# Patient Record
Sex: Female | Born: 1985 | Race: White | Hispanic: Yes | Marital: Single | Smoking: Never smoker
Health system: Southern US, Community
[De-identification: ages and names within clinical notes are randomized; demographics above are authoritative.]

## PROBLEM LIST (undated history)

## (undated) ENCOUNTER — Emergency Department (HOSPITAL_COMMUNITY): Disposition: A | Discharge status: Home / Self Care | Payer: Self-pay

## (undated) DIAGNOSIS — K7460 Unspecified cirrhosis of liver: Secondary | ICD-10-CM

## (undated) DIAGNOSIS — E282 Polycystic ovarian syndrome: Secondary | ICD-10-CM

## (undated) HISTORY — DX: Unspecified cirrhosis of liver: K74.60

## (undated) NOTE — *Deleted (*Deleted)
HEMATOLOGY/ONCOLOGY CONSULTATION NOTE  Date of Service: 01/05/2020  Patient Care Team: Patient, No Pcp Per as PCP - General (General Practice)  CHIEF COMPLAINTS/PURPOSE OF CONSULTATION:  Chronic ITP  HISTORY OF PRESENTING ILLNESS:  Megan Fuller is a wonderful 61 y.o. female who has been referred to [place] by Dr. 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 Pt is accompanied today by ***. The pt reports that she is doing well overall.   The pt reports ***   Of note prior to the patient's visit today, pt has had *** completed on *** with results revealing ***.   Most recent lab results (01/01/2020) of CBC and CMP is as follows: all values are WNL except for RBC at 2.69, Hgb at 9.5, HCT at 28.3, MCV at 105.2, MCH at 35.3, PLT at 77K, Calcium at 8.6, Total Protein at 5.3, Albumin at 2.3, AST at 47.  On review of systems, pt reports *** and denies ***and any other symptoms.   On PMHx the pt reports ***. On Social Hx the pt reports *** On Family Hx the pt reports ***  A: -Discussed patient's most recent labs from ***, *** -***  MEDICAL HISTORY:  Past Medical History:  Diagnosis Date  . PCOS (polycystic ovarian syndrome)     SURGICAL HISTORY: Past Surgical History:  Procedure Laterality Date  . CESAREAN SECTION N/A 12/26/2019   Procedure: CESAREAN SECTION;  Surgeon: [place] Bing, MD;  Location: [facility];  Service: Obstetrics;  Laterality: N/A;    SOCIAL HISTORY: Social History   Socioeconomic History  . Marital status: Single    Spouse name: Not on file  . Number of children: Not on file  . Years of education: Not on file  . Highest education level: Not on file  Occupational History  . Not on file  Tobacco Use  . Smoking status: Never Smoker  . Smokeless tobacco: Never Used  Substance and Sexual Activity  . Alcohol use: No    Alcohol/week: 0.0 standard drinks  . Drug use: No  . Sexual activity: Yes  Other Topics Concern  . Not on file  Social  History Narrative  . Not on file   Social Determinants of Health   Financial Resource Strain:   . Difficulty of Paying Living Expenses: Not on file  Food Insecurity:   . Worried About Programme researcher, broadcasting/film/video in the Last Year: Not on file  . Ran Out of Food in the Last Year: Not on file  Transportation Needs:   . Lack of Transportation (Medical): Not on file  . Lack of Transportation (Non-Medical): Not on file  Physical Activity:   . Days of Exercise per Week: Not on file  . Minutes of Exercise per Session: Not on file  Stress:   . Feeling of Stress : Not on file  Social Connections:   . Frequency of Communication with Friends and Family: Not on file  . Frequency of Social Gatherings with Friends and Family: Not on file  . Attends Religious Services: Not on file  . Active Member of Clubs or Organizations: Not on file  . Attends Banker Meetings: Not on file  . Marital Status: Not on file  Intimate Partner Violence:   . Fear of Current or Ex-Partner: Not on file  . Emotionally Abused: Not on file  . Physically Abused: Not on file  . Sexually Abused: Not on file    FAMILY HISTORY: Family History  Problem Relation Age  of Onset  . Diabetes Mother     ALLERGIES:  has No Known Allergies.  MEDICATIONS:  Current Outpatient Medications  Medication Sig Dispense Refill  . amLODipine (NORVASC) 5 MG tablet Take 1 tablet (5 mg total) by mouth daily. 30 tablet 2  . ferrous sulfate 325 (65 FE) MG tablet Take 1 tablet (325 mg total) by mouth 3 (three) times daily with meals. 30 tablet 3  . ibuprofen (ADVIL) 600 MG tablet Take 1 tablet (600 mg total) by mouth every 6 (six) hours. 30 tablet 0  . oxyCODONE-acetaminophen (PERCOCET/ROXICET) 5-325 MG tablet Take 1-2 tablets by mouth every 6 (six) hours as needed for moderate pain or severe pain. 30 tablet 0  . Prenat MV-Min-Methylfolate-FA (PRENATE PO) Take by mouth.    . QUEtiapine (SEROQUEL) 25 MG tablet Take 1 tablet (25 mg total)  by mouth at bedtime. 30 tablet 1   No current facility-administered medications for this visit.    REVIEW OF SYSTEMS:    10 Point review of Systems was done is negative except as noted above.  PHYSICAL EXAMINATION: ECOG PERFORMANCE STATUS: {CHL ONC ECOG HQ:[id number]}  .There were no vitals filed for this visit. There were no vitals filed for this visit. .There is no height or weight on file to calculate BMI.  *** GENERAL:alert, in no acute distress and comfortable SKIN: no acute rashes, no significant lesions EYES: conjunctiva are pink and non-injected, sclera anicteric OROPHARYNX: MMM, no exudates, no oropharyngeal erythema or ulceration NECK: supple, no JVD LYMPH:  no palpable lymphadenopathy in the cervical, axillary or inguinal regions LUNGS: clear to auscultation b/l with normal respiratory effort HEART: regular rate & rhythm ABDOMEN:  normoactive bowel sounds , non tender, not distended. Extremity: no pedal edema PSYCH: alert & oriented x 3 with fluent speech NEURO: no focal motor/sensory deficits  LABORATORY DATA:  I have reviewed the data as listed  . CBC Latest Ref Rng & Units 01/01/2020 12/28/2019 12/27/2019  WBC 4.0 - 10.5 K/uL 4.5 7.5 10.0  Hemoglobin 12.0 - 15.0 g/dL 1.3(K) 4.4(W) 10.0(L)  Hematocrit 36 - 46 % 28.3(L) 26.2(L) 28.7(L)  Platelets 150 - 400 K/uL 77(L) 60(L) 58(L)    . CMP Latest Ref Rng & Units 01/01/2020 12/28/2019 12/15/2019  Glucose 70 - 99 mg/dL 94 10(U) 725(D)  BUN 6 - 20 mg/dL 10 6 5(L)  Creatinine 6.64 - 1.00 mg/dL 4.03 4.74 2.59  Sodium 135 - 145 mmol/L 139 139 132(L)  Potassium 3.5 - 5.1 mmol/L 3.7 3.8 3.9  Chloride 98 - 111 mmol/L 107 108 104  CO2 22 - 32 mmol/L 25 23 19(L)  Calcium 8.9 - 10.3 mg/dL 5.6(L) 8.7(F) 7.2(L)  Total Protein 6.5 - 8.1 g/dL 5.3(L) 5.1(L) 6.2(L)  Total Bilirubin 0.3 - 1.2 mg/dL 1.0 6.4(P) 2.4(H)  Alkaline Phos 38 - 126 U/L 114 94 94  AST 15 - 41 U/L 47(H) 55(H) 39  ALT 0 - 44 U/L 30 24 18       RADIOGRAPHIC STUDIES: I have personally reviewed the radiological images as listed and agreed with the findings in the report. [place] MFM OB DETAIL +14 WK  Result Date: 12/16/2019 ----------------------------------------------------------------------  OBSTETRICS REPORT                       (Signed Final 12/16/2019 04:31 pm) ---------------------------------------------------------------------- Patient Info  ID #:       [id number]  D.O.B.:  1985/08/16 (34 yrs)  Name:       Megan Fuller              Visit Date: 12/16/2019 09:37 am ---------------------------------------------------------------------- Performed By  Attending:        Lin Landsman      Referred By:      [facility]                    MD                                       Care  Performed By:     Lenise Arena        [facility] ---------------------------------------------------------------------- Orders  #  Description                           Code        Ordered By  1  [place] MFM OB DETAIL +14 WK               76811.01    Scheryl Darter ----------------------------------------------------------------------  #  Order #                     Accession #                Episode #  1  [id number]                   [id number]                 [id number] ---------------------------------------------------------------------- Indications  Premature rupture of membranes - leaking       O42.90  fluid  Encounter for uncertain dates                  Z36.87  Medical complication of pregnancy (Adult       O26.90  chronic immune thrombocytopenic purpura)  Obesity complicating pregnancy, second         O99.212  trimester (BMI 31)  [redacted] weeks gestation of pregnancy                Z3A.23 ---------------------------------------------------------------------- Vital Signs  Weight (lb): 181                               Height:        5'4"  BMI:          31.07 ---------------------------------------------------------------------- Fetal Evaluation  Num Of Fetuses:         1  Fetal Heart Rate(bpm):  150  Cardiac Activity:       Observed  Presentation:           Cephalic  Placenta:               Anterior  P. Cord Insertion:      Visualized, central  Amniotic Fluid  AFI FV:  Oligohydramnios                              Largest Pocket(cm)                              3.3 ---------------------------------------------------------------------- Biometry  BPD:      55.7  mm     G. Age:  23w 0d         19  %    CI:        70.84   %    70 - 86                                                          FL/HC:      18.8   %    18.7 - 20.9  HC:      210.9  mm     G. Age:  23w 1d         15  %    HC/AC:      1.07        1.05 - 1.21  AC:      197.7  mm     G. Age:  24w 3d         65  %    FL/BPD:     71.1   %    71 - 87  FL:       39.6  mm     G. Age:  22w 5d         13  %    FL/AC:      20.0   %    20 - 24  CER:      25.3  mm     G. Age:  23w 0d         48  %  CM:        5.9  mm  Est. FW:     612  gm      1 lb 6 oz     37  % ---------------------------------------------------------------------- OB History  Gravidity:    1 ---------------------------------------------------------------------- Gestational Age  U/S Today:     23w 2d                                        EDD:   04/11/20  Best:          23w 5d     Det. ByMarcella Dubs         EDD:   04/08/20                                      (08/10/19) ---------------------------------------------------------------------- Anatomy  Cranium:               Not well visualized    Aortic Arch:            Not well visualized  Cavum:                 Not well  visualized    Ductal Arch:            Not well visualized  Ventricles:            Not well visualized    Diaphragm:              Not well visualized  Choroid Plexus:        Not well visualized    Stomach:                Absence of fluid                                                                         filled stomach  Cerebellum:            Appears normal         Abdomen:                Appears normal  Posterior Fossa:       Appears normal         Abdominal Wall:         Appears nml (cord                                                                        insert, abd wall)  Nuchal Fold:           Not applicable (>20    Cord Vessels:           Appears normal ([redacted]                         wks GA)                                        vessel cord)  Face:                  Not well visualized    Kidneys:                Appear normal  Lips:                  Not well visualized    Bladder:                Appears normal  Thoracic:              Appears normal         Spine:                  Ltd views no  intracranial signs of                                                                        NTD  Heart:                 Not well visualized    Upper Extremities:      Appears normal  RVOT:                  Appears normal         Lower Extremities:      Appears normal  LVOT:                  Not well visualized  Other:  Technically difficult due to low amniotic fluid and fetal position. ---------------------------------------------------------------------- Cervix Uterus Adnexa  Cervix  Not visualized (advanced GA >24wks) ---------------------------------------------------------------------- Impression  Single intrauterine pregnancy here for a detailed anatomy  with an inpatient admission due to premature rupture of  membranes.  Normal anatomy with measurements consistent with dates  There is good fetal movement and with oligohydramnios.  Suboptimal views of the fetal anatomy were obtained  secondary to fetal position and decreased amniotic fluid  volume. ---------------------------------------------------------------------- Recommendations  Follow up growth in 4 week  Management and testing per in patient provider.  ----------------------------------------------------------------------               Lin Landsman, MD Electronically Signed Final Report   12/16/2019 04:31 pm ----------------------------------------------------------------------  [place] MFM OB LIMITED  Result Date: 12/23/2019 ----------------------------------------------------------------------  OBSTETRICS REPORT                       (Signed Final 12/23/2019 02:15 pm) ---------------------------------------------------------------------- Patient Info  ID #:       [id number]                          D.O.B.:  [birth year] (34 yrs)  Name:       Megan Fuller              Visit Date: 12/23/2019 01:57 pm ---------------------------------------------------------------------- Performed By  Attending:        Noralee Space MD        Referred By:      [facility]                                                             Care  Performed By:     Sandi Mealy        [facility] ---------------------------------------------------------------------- Orders  #  Description  Code        Ordered By  1  [place] MFM OB LIMITED                     [id number]    Scheryl Darter ----------------------------------------------------------------------  #  Order #                     Accession #                Episode #  1  [id number]                   [id number]                 [id number] ---------------------------------------------------------------------- Indications  Premature rupture of membranes - leaking       O42.90  fluid  Medical complication of pregnancy (Adult       O26.90  chronic immune thrombocytopenic purpura)  Obesity complicating pregnancy, second         O99.212  trimester (BMI 31)  [redacted] weeks gestation of pregnancy                Z3A.24 ---------------------------------------------------------------------- Vital Signs                                                  Height:        5'4" ---------------------------------------------------------------------- Fetal Evaluation  Num Of Fetuses:         1  Fetal Heart Rate(bpm):  146  Cardiac Activity:       Observed  Presentation:           Cephalic  Placenta:               Anterior  P. Cord Insertion:      Visualized  Amniotic Fluid  AFI FV:      Anhydramnios                              Largest Pocket(cm)                              0 ---------------------------------------------------------------------- OB History  Gravidity:    1 ---------------------------------------------------------------------- Gestational Age  Best:          24w 5d     Det. ByMarcella Dubs         EDD:   04/08/20                                      (08/10/19) ---------------------------------------------------------------------- Impression  Patient was admitted with a diagnosis of PPROM.  She complains of decreased fetal movements.  A limited ultrasound study was performed .  No measurable  pocket of amniotic fluid was seen (anhydramnios).  Cephalic  presentation.  Good fetal activity was observed on ultrasound.  I reviewed the NST which is reassuring for this gestational  age. ---------------------------------------------------------------------- Recommendations  -Weekly limited ultrasound evaluation.  -BPP to begin at 28 to [redacted] weeks gestation. ----------------------------------------------------------------------                  Noralee Space, MD Electronically Signed Final Report  12/23/2019 02:15 pm ----------------------------------------------------------------------   ASSESSMENT & PLAN:   PLAN: ***  FOLLOW UP: ***   All of the patients questions were answered with apparent satisfaction. The patient knows to call the clinic with any problems, questions or concerns.  I spent *** counseling the patient face to face. The total time spent in the appointment was *** and more than 50% was on counseling and direct  patient cares.    Wyvonnia Lora MD MS AAHIVMS [facility] Hematology/Oncology Physician [facility]  (Office):       [phone] (Work cell):  [phone] (Fax):           [phone]  01/05/2020 7:40 AM  I, Carollee Herter, am acting as a scribe for Dr. Wyvonnia Lora.   {Add Production assistant, radio Statement}

---
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
## 2007-03-16 ENCOUNTER — Ambulatory Visit: Payer: Self-pay | Admitting: Internal Medicine

## 2007-03-16 LAB — CONVERTED CEMR LAB
ALT: 32 units/L (ref 0–35)
Basophils Absolute: 0 10*3/uL (ref 0.0–0.1)
Eosinophils Relative: 2 % (ref 0–5)
Glucose, Bld: 88 mg/dL (ref 70–99)
HCT: 40.6 % (ref 36.0–46.0)
MCHC: 34.2 g/dL (ref 30.0–36.0)
Monocytes Relative: 7 % (ref 3–12)
Neutrophils Relative %: 63 % (ref 43–77)
Potassium: 4.1 meq/L (ref 3.5–5.3)
RDW: 12.7 % (ref 11.5–15.5)
Sodium: 140 meq/L (ref 135–145)
TSH: 0.895 microintl units/mL (ref 0.350–5.50)
Total Bilirubin: 1.4 mg/dL — ABNORMAL HIGH (ref 0.3–1.2)

## 2007-04-13 ENCOUNTER — Ambulatory Visit: Payer: Self-pay | Admitting: Internal Medicine

## 2007-04-13 ENCOUNTER — Encounter: Payer: Self-pay | Admitting: Family Medicine

## 2007-04-13 LAB — CONVERTED CEMR LAB
Basophils Relative: 1 % (ref 0–1)
Eosinophils Absolute: 0.1 10*3/uL (ref 0.0–0.7)
Hemoglobin: 12.7 g/dL (ref 12.0–15.0)
Lymphocytes Relative: 32 % (ref 12–46)
MCHC: 33.9 g/dL (ref 30.0–36.0)
MCV: 94 fL (ref 78.0–100.0)
Neutro Abs: 2.6 10*3/uL (ref 1.7–7.7)
Platelets: 73 10*3/uL — ABNORMAL LOW (ref 150–400)
Sed Rate: 10 mm/hr (ref 0–22)
WBC: 4.4 10*3/uL (ref 4.0–10.5)

## 2007-05-11 ENCOUNTER — Ambulatory Visit: Payer: Self-pay | Admitting: Internal Medicine

## 2007-05-11 LAB — CONVERTED CEMR LAB
Basophils Absolute: 0 10*3/uL (ref 0.0–0.1)
Eosinophils Absolute: 0.1 10*3/uL (ref 0.0–0.7)
Lymphs Abs: 1.3 10*3/uL (ref 0.7–4.0)
Monocytes Absolute: 0.3 10*3/uL (ref 0.1–1.0)
Monocytes Relative: 6 % (ref 3–12)
Neutrophils Relative %: 63 % (ref 43–77)
RBC: 4.24 M/uL (ref 3.87–5.11)
RDW: 13.1 % (ref 11.5–15.5)

## 2007-07-08 ENCOUNTER — Ambulatory Visit: Payer: Self-pay | Admitting: Family Medicine

## 2007-07-08 ENCOUNTER — Encounter (INDEPENDENT_AMBULATORY_CARE_PROVIDER_SITE_OTHER): Payer: Self-pay | Admitting: Internal Medicine

## 2007-07-08 LAB — CONVERTED CEMR LAB
ALT: 29 units/L (ref 0–35)
Basophils Absolute: 0 10*3/uL (ref 0.0–0.1)
Basophils Relative: 0 % (ref 0–1)
Chloride: 107 meq/L (ref 96–112)
Eosinophils Absolute: 0.1 10*3/uL (ref 0.0–0.7)
HCT: 39.9 % (ref 36.0–46.0)
Helicobacter Pylori Antibody-IgG: 0.4
Lymphocytes Relative: 29 % (ref 12–46)
Lymphs Abs: 1 10*3/uL (ref 0.7–4.0)
MCHC: 33.6 g/dL (ref 30.0–36.0)
MCV: 94.8 fL (ref 78.0–100.0)
Monocytes Absolute: 0.2 10*3/uL (ref 0.1–1.0)
Neutro Abs: 2.1 10*3/uL (ref 1.7–7.7)
Neutrophils Relative %: 62 % (ref 43–77)
Potassium: 3.8 meq/L (ref 3.5–5.3)
RBC: 4.21 M/uL (ref 3.87–5.11)
Total Protein: 8 g/dL (ref 6.0–8.3)

## 2007-08-17 ENCOUNTER — Ambulatory Visit: Payer: Self-pay | Admitting: Family Medicine

## 2007-08-17 LAB — CONVERTED CEMR LAB
Basophils Absolute: 0 10*3/uL (ref 0.0–0.1)
Basophils Relative: 1 % (ref 0–1)
HCT: 41.3 % (ref 36.0–46.0)
Lymphocytes Relative: 34 % (ref 12–46)
MCV: 96.9 fL (ref 78.0–100.0)
Monocytes Absolute: 0.3 10*3/uL (ref 0.1–1.0)
Neutro Abs: 1.9 10*3/uL (ref 1.7–7.7)
TSH: 2.857 microintl units/mL (ref 0.350–5.50)
Vitamin B-12: 679 pg/mL (ref 211–911)
WBC: 3.5 10*3/uL — ABNORMAL LOW (ref 4.0–10.5)

## 2007-11-30 ENCOUNTER — Ambulatory Visit: Payer: Self-pay | Admitting: Family Medicine

## 2008-03-14 ENCOUNTER — Ambulatory Visit: Payer: Self-pay | Admitting: Family Medicine

## 2008-05-09 ENCOUNTER — Ambulatory Visit: Payer: Self-pay | Admitting: Family Medicine

## 2008-05-09 LAB — CONVERTED CEMR LAB
Basophils Absolute: 0 10*3/uL (ref 0.0–0.1)
Basophils Relative: 1 % (ref 0–1)
Eosinophils Absolute: 0.1 10*3/uL (ref 0.0–0.7)
HCT: 38.9 % (ref 36.0–46.0)
Hemoglobin: 13.2 g/dL (ref 12.0–15.0)
Lymphs Abs: 1 10*3/uL (ref 0.7–4.0)
MCHC: 33.9 g/dL (ref 30.0–36.0)
Monocytes Absolute: 0.3 10*3/uL (ref 0.1–1.0)
Neutro Abs: 2.4 10*3/uL (ref 1.7–7.7)

## 2008-05-16 ENCOUNTER — Ambulatory Visit: Payer: Self-pay | Admitting: Family Medicine

## 2008-05-16 ENCOUNTER — Encounter: Payer: Self-pay | Admitting: Family Medicine

## 2008-05-16 LAB — CONVERTED CEMR LAB
AST: 66 units/L — ABNORMAL HIGH (ref 0–37)
Albumin: 3.5 g/dL (ref 3.5–5.2)
BUN: 13 mg/dL (ref 6–23)
CO2: 21 meq/L (ref 19–32)
Chlamydia, DNA Probe: NEGATIVE
Creatinine, Ser: 0.63 mg/dL (ref 0.40–1.20)
Glucose, Bld: 87 mg/dL (ref 70–99)
Prolactin: 17 ng/mL
Testosterone: 159.35 ng/dL — ABNORMAL HIGH (ref 10–70)
Total Bilirubin: 1.3 mg/dL — ABNORMAL HIGH (ref 0.3–1.2)

## 2008-05-20 ENCOUNTER — Ambulatory Visit (HOSPITAL_COMMUNITY): Admission: RE | Admit: 2008-05-20 | Discharge: 2008-05-20 | Payer: Self-pay | Admitting: Family Medicine

## 2008-08-08 ENCOUNTER — Ambulatory Visit: Payer: Self-pay | Admitting: Internal Medicine

## 2008-11-21 ENCOUNTER — Ambulatory Visit: Payer: Self-pay | Admitting: Internal Medicine

## 2008-11-21 ENCOUNTER — Encounter: Payer: Self-pay | Admitting: Family Medicine

## 2008-11-21 LAB — CONVERTED CEMR LAB
Albumin: 3.6 g/dL (ref 3.5–5.2)
Basophils Relative: 0 % (ref 0–1)
CO2: 22 meq/L (ref 19–32)
Calcium: 8.7 mg/dL (ref 8.4–10.5)
Chloride: 108 meq/L (ref 96–112)
Eosinophils Relative: 3 % (ref 0–5)
Lymphocytes Relative: 34 % (ref 12–46)
Lymphs Abs: 1.1 10*3/uL (ref 0.7–4.0)
MCHC: 33.9 g/dL (ref 30.0–36.0)
MCV: 95.1 fL (ref 78.0–100.0)
Monocytes Absolute: 0.3 10*3/uL (ref 0.1–1.0)
Monocytes Relative: 8 % (ref 3–12)
Neutrophils Relative %: 55 % (ref 43–77)
Platelets: 54 10*3/uL — ABNORMAL LOW (ref 150–400)
RDW: 13 % (ref 11.5–15.5)
Total Bilirubin: 1 mg/dL (ref 0.3–1.2)
Total Protein: 7.4 g/dL (ref 6.0–8.3)

## 2008-11-28 ENCOUNTER — Ambulatory Visit: Payer: Self-pay | Admitting: Internal Medicine

## 2008-11-28 LAB — CONVERTED CEMR LAB
HCV Ab: NEGATIVE
Hep A Total Ab: POSITIVE — AB
Hep B Core Total Ab: NEGATIVE
Hepatitis B Surface Ag: NEGATIVE

## 2008-12-05 ENCOUNTER — Ambulatory Visit (HOSPITAL_COMMUNITY): Admission: RE | Admit: 2008-12-05 | Discharge: 2008-12-05 | Payer: Self-pay | Admitting: Internal Medicine

## 2009-01-02 ENCOUNTER — Ambulatory Visit: Payer: Self-pay | Admitting: Internal Medicine

## 2009-01-30 ENCOUNTER — Encounter: Payer: Self-pay | Admitting: Family Medicine

## 2009-01-30 ENCOUNTER — Ambulatory Visit: Payer: Self-pay | Admitting: Internal Medicine

## 2009-01-30 LAB — CONVERTED CEMR LAB
Anti Nuclear Antibody(ANA): NEGATIVE
CRP: 0.1 mg/dL (ref <0.6)
Sed Rate: 19 mm/hr (ref 0–22)

## 2009-02-02 ENCOUNTER — Ambulatory Visit: Payer: Self-pay | Admitting: Internal Medicine

## 2009-03-06 ENCOUNTER — Encounter (INDEPENDENT_AMBULATORY_CARE_PROVIDER_SITE_OTHER): Payer: Self-pay | Admitting: Adult Health

## 2009-03-06 ENCOUNTER — Ambulatory Visit: Payer: Self-pay | Admitting: Internal Medicine

## 2009-03-06 LAB — CONVERTED CEMR LAB
ALT: 58 units/L — ABNORMAL HIGH (ref 0–35)
Alkaline Phosphatase: 109 units/L (ref 39–117)
BUN: 11 mg/dL (ref 6–23)
Basophils Absolute: 0 10*3/uL (ref 0.0–0.1)
Basophils Relative: 1 % (ref 0–1)
Calcium: 9 mg/dL (ref 8.4–10.5)
Hemoglobin: 13.2 g/dL (ref 12.0–15.0)
Lymphocytes Relative: 29 % (ref 12–46)
Lymphs Abs: 1.1 10*3/uL (ref 0.7–4.0)
Monocytes Absolute: 0.4 10*3/uL (ref 0.1–1.0)
Monocytes Relative: 9 % (ref 3–12)
Neutro Abs: 2.3 10*3/uL (ref 1.7–7.7)
Potassium: 3.9 meq/L (ref 3.5–5.3)
RBC: 4.07 M/uL (ref 3.87–5.11)
Sodium: 140 meq/L (ref 135–145)
TSH: 2.51 microintl units/mL (ref 0.350–4.500)
Total Bilirubin: 1.1 mg/dL (ref 0.3–1.2)
Total Protein: 7.9 g/dL (ref 6.0–8.3)
WBC: 3.9 10*3/uL — ABNORMAL LOW (ref 4.0–10.5)

## 2010-01-08 ENCOUNTER — Encounter (INDEPENDENT_AMBULATORY_CARE_PROVIDER_SITE_OTHER): Payer: Self-pay | Admitting: *Deleted

## 2010-01-08 LAB — CONVERTED CEMR LAB: TSH: 1.732 microintl units/mL (ref 0.350–4.500)

## 2010-02-06 ENCOUNTER — Encounter (INDEPENDENT_AMBULATORY_CARE_PROVIDER_SITE_OTHER): Payer: Self-pay | Admitting: *Deleted

## 2010-02-06 LAB — CONVERTED CEMR LAB: Chlamydia, DNA Probe: NEGATIVE

## 2010-05-01 ENCOUNTER — Emergency Department (HOSPITAL_COMMUNITY)
Admission: EM | Admit: 2010-05-01 | Discharge: 2010-05-01 | Disposition: A | Discharge status: Home / Self Care | Payer: Self-pay | Attending: Emergency Medicine | Admitting: Emergency Medicine

## 2010-05-01 DIAGNOSIS — K5900 Constipation, unspecified: Secondary | ICD-10-CM | POA: Insufficient documentation

## 2010-05-01 DIAGNOSIS — N390 Urinary tract infection, site not specified: Secondary | ICD-10-CM | POA: Insufficient documentation

## 2010-05-01 DIAGNOSIS — R109 Unspecified abdominal pain: Secondary | ICD-10-CM | POA: Insufficient documentation

## 2010-05-01 DIAGNOSIS — R350 Frequency of micturition: Secondary | ICD-10-CM | POA: Insufficient documentation

## 2010-05-01 DIAGNOSIS — R110 Nausea: Secondary | ICD-10-CM | POA: Insufficient documentation

## 2010-05-01 LAB — URINALYSIS, ROUTINE W REFLEX MICROSCOPIC
Bilirubin Urine: NEGATIVE
Glucose, UA: NEGATIVE mg/dL
pH: 7.5 (ref 5.0–8.0)

## 2010-05-01 LAB — URINE MICROSCOPIC-ADD ON

## 2010-09-18 ENCOUNTER — Other Ambulatory Visit: Payer: Self-pay | Admitting: Family Medicine

## 2010-12-06 LAB — POCT URINALYSIS DIP (DEVICE)
Bilirubin Urine: NEGATIVE
Bilirubin Urine: NEGATIVE
Glucose, UA: NEGATIVE
Nitrite: POSITIVE — AB
Nitrite: POSITIVE — AB
Operator id: 235561
Protein, ur: 100 — AB
Specific Gravity, Urine: 1.02
Urobilinogen, UA: 1
Urobilinogen, UA: 1

## 2010-12-06 LAB — WET PREP, GENITAL
Clue Cells Wet Prep HPF POC: NONE SEEN
Trich, Wet Prep: NONE SEEN
Yeast Wet Prep HPF POC: NONE SEEN

## 2010-12-06 LAB — GC/CHLAMYDIA PROBE AMP, GENITAL: Chlamydia, DNA Probe: NEGATIVE

## 2012-06-01 ENCOUNTER — Ambulatory Visit: Payer: Self-pay

## 2012-06-19 ENCOUNTER — Telehealth: Payer: Self-pay | Admitting: Oncology

## 2012-06-19 NOTE — Telephone Encounter (Signed)
LVOM FOR PT TO RETURN CALL IN RE TO NP APPT.  °

## 2012-07-03 ENCOUNTER — Telehealth: Payer: Self-pay | Admitting: Oncology

## 2012-07-03 NOTE — Telephone Encounter (Signed)
S/W PT IN RE NP APPT 06/10 @ 1:30 W/DR. SHADAD REFERRING - GENERAL MEDICAL CLINIC DX- THROMBOCYTOPENIA WELCOME PACKET MAILED

## 2012-07-03 NOTE — Telephone Encounter (Signed)
C/D 07/03/12 for appt. 08/04/12

## 2012-07-31 ENCOUNTER — Other Ambulatory Visit: Payer: Self-pay | Admitting: Oncology

## 2012-07-31 DIAGNOSIS — D696 Thrombocytopenia, unspecified: Secondary | ICD-10-CM

## 2012-08-04 ENCOUNTER — Encounter: Payer: Self-pay | Admitting: Oncology

## 2012-08-04 ENCOUNTER — Telehealth: Payer: Self-pay | Admitting: Oncology

## 2012-08-04 ENCOUNTER — Ambulatory Visit (HOSPITAL_BASED_OUTPATIENT_CLINIC_OR_DEPARTMENT_OTHER): Payer: Self-pay | Admitting: Oncology

## 2012-08-04 ENCOUNTER — Ambulatory Visit: Payer: Self-pay

## 2012-08-04 ENCOUNTER — Other Ambulatory Visit (HOSPITAL_BASED_OUTPATIENT_CLINIC_OR_DEPARTMENT_OTHER): Payer: Self-pay | Admitting: Lab

## 2012-08-04 VITALS — BP 129/74 | HR 76 | Temp 98.2°F | Resp 19 | Ht 63.0 in | Wt 148.3 lb

## 2012-08-04 DIAGNOSIS — D696 Thrombocytopenia, unspecified: Secondary | ICD-10-CM

## 2012-08-04 LAB — CBC WITH DIFFERENTIAL/PLATELET
Basophils Absolute: 0 10*3/uL (ref 0.0–0.1)
Eosinophils Absolute: 0.1 10*3/uL (ref 0.0–0.5)
HCT: 37.7 % (ref 34.8–46.6)
HGB: 13.3 g/dL (ref 11.6–15.9)
LYMPH%: 21.9 % (ref 14.0–49.7)
MONO%: 6.9 % (ref 0.0–14.0)
NEUT%: 67.9 % (ref 38.4–76.8)
RBC: 4.03 10*6/uL (ref 3.70–5.45)
WBC: 3.6 10*3/uL — ABNORMAL LOW (ref 3.9–10.3)

## 2012-08-04 LAB — COMPREHENSIVE METABOLIC PANEL (CC13)
AST: 52 U/L — ABNORMAL HIGH (ref 5–34)
Alkaline Phosphatase: 135 U/L (ref 40–150)
BUN: 10.5 mg/dL (ref 7.0–26.0)
Potassium: 4 mEq/L (ref 3.5–5.1)
Sodium: 141 mEq/L (ref 136–145)

## 2012-08-04 LAB — CHCC SMEAR

## 2012-08-04 NOTE — Telephone Encounter (Signed)
gv and printed appt sched and avs for pt  °

## 2012-08-04 NOTE — Progress Notes (Signed)
Checked in new pt with no ins.  She is not a [place] Citizen so she cannot apply for Medicaid.  I gave her an application for financial assistance and showed her where to mail the completed application to for processing.

## 2012-08-04 NOTE — Progress Notes (Signed)
Note dictated

## 2012-08-05 NOTE — Progress Notes (Signed)
CC:   Megan Fuller, Fax [phone]  REASON FOR CONSULTATION:  Thrombocytopenia.  HISTORY OF PRESENT ILLNESS:  Megan Fuller is a pleasant 27 year old woman, native [place] who has been living in this area for the last 8 years.  She is a generally healthy woman without any known chronic medical condition.  Does not really get routine medical care, but established care at the [facility] due to symptoms of hirsutism and a presumed diagnosis of polycystic ovarian disease.  At that time she had a battery of laboratory testing including a CBC which it was noted that she had a normal white cell count of 4.1, normal hemoglobin of 14.4, and a platelet count of 70,000, the lower limit of normal of 150.  She had a normal FSH and LH.  Her testosterone was elevated at 188.  Her TSH was normal.  A previous CBC in April 2014 showed the platelet count was 67,000.  She had mild elevation in her AST and ALT, but otherwise no other abnormalities.  For that reason, patient referred to me for evaluation of thrombocytopenia.  Clinically, she has been relatively asymptomatic from this.  She is amenorrheic and does not report any other bleeding.  She does not report any epistaxis, does not report any hematochezia, does not report any melena, does not report any constitutional symptoms, does not report really any easy bruisability. She had been told in the past that she had thrombocytopenia since her teenage years.  Looking at her laboratory data dating back to 2009, she has had platelet counts as low as 54,000 and has fluctuated as high as 76,000.  REVIEW OF SYSTEMS:  She does not report any headaches.  Does not report any blurry vision.  Does not report any motor or sensory neuropathy. Does not report any alteration in mental status.  Does not report any psychiatric issues.  Does not report any depression.  Does not report any constitutional symptoms.  Does not report any chest  pain, orthopnea. Does not report any heat or cold intolerance.  Does not report any bleeding diathesis.  Rest of comprehensive review of systems was otherwise unremarkable.  PAST MEDICAL HISTORY:  Really unremarkable.  She does have any history of hypertension, diabetes.  She does have history, as mentioned, of polycystic ovarian disease and hirsutism, but no history of hypertension, diabetes, coronary artery disease.  She has not been pregnant before.  MEDICATIONS:  She is on vitamin supplements only.  ALLERGIES:  None.  SOCIAL HISTORY:  She denied any alcohol or tobacco abuse.  Currently unemployed.  FAMILY HISTORY:  No history of any blood disorders or any malignancies or diabetes in her family.  PHYSICAL EXAMINATION:  General:  Alert, awake, pleasant woman, appeared in no active distress today.  Vital Signs:  Blood pressure is 129/74, pulse 76, respirations 19, she weighs 148 pounds.  HEENT:  Head is normocephalic, atraumatic.  Pupils equal, round, reactive to light. Oral mucosa moist and pink.  Neck:  Supple without lymphadenopathy. Heart:  Regular rate and rhythm, S1 and S2.  Lungs:  Clear to auscultation without rhonchi, wheeze, dullness to percussion.  Abdomen: Soft, nontender.  No hepatosplenomegaly.  Extremities:  No clubbing, cyanosis, or edema.  I could not appreciate any petechiae or ecchymoses.  LABORATORY DATA:  Showed a hemoglobin of 13.3, white cell count of 3.6, platelet count of 54.  She had normal differential.  Peripheral smear was personally reviewed today and did not really show any evidence of  any schistocytosis, red cell fragmentation, no evidence of any platelet clumping.  White cells appeared normal.  ASSESSMENT AND PLAN:  A 27 year old woman with the following issues: 1. Thrombocytopenia.  Her thrombocytopenia is rather chronic, dating     back to at least 2009 according to our laboratory data and even     further per her history.  Her  thrombocytopenia has fluctuated,     being mild to more severe, without being symptomatic.  She does not     report any evidence of bleeding or epistaxis or easy bruisability.     Differential diagnosis was discussed today with the patient through     an interpreter.  Most likely we are dealing with autoimmune     thrombocytopenia, condition such as idiopathic thrombocytopenic     purpura.  Could be also possibly reactive process.  There is really     no medication that could explain this at this point.  Other     conditions such as thrombotic thrombocytopenia purpura, hemolytic     uremic syndrome, and heparin-induced thrombocytopenia are extremely     unlikely at this time.  I do not see any evidence to suggest     malignant disorder such as leukemia or lymphoma or myelodysplastic     syndrome.  In terms of the management at this time, will continue     to observe her in and follow her counts closely.  Unless her     platelet counts drop down below 50 or she develops any bleeding     episodes, I will not attempt to do steroids or IVIG at this time. 2. Elevated a prothrombin time at 15.6.  This could be due to mild     vitamin K deficiency.  I do not think this really is of any     consequence at this time.  I do not think it is indicative of any     hematological disorder. 3. Elevated testosterone, questionable hirsutism, and polycystic     ovarian disease.  I do not think this is related to her     thrombocytopenia at this time.  She is following up with her     primary care regarding that.  All her questions were answered today.    ______________________________ Megan Fuller, M.D. FNS/MEDQ  D:  08/04/2012  T:  08/05/2012  Job:  [id number]

## 2013-02-02 ENCOUNTER — Ambulatory Visit: Payer: Self-pay | Admitting: Oncology

## 2013-02-02 ENCOUNTER — Other Ambulatory Visit: Payer: Self-pay | Admitting: Lab

## 2013-05-30 ENCOUNTER — Encounter (HOSPITAL_COMMUNITY): Payer: Self-pay | Admitting: Emergency Medicine

## 2013-05-30 ENCOUNTER — Emergency Department (HOSPITAL_COMMUNITY): Payer: No Typology Code available for payment source

## 2013-05-30 ENCOUNTER — Emergency Department (HOSPITAL_COMMUNITY)
Admission: EM | Admit: 2013-05-30 | Discharge: 2013-05-31 | Disposition: A | Discharge status: Home / Self Care | Payer: No Typology Code available for payment source | Attending: Emergency Medicine | Admitting: Emergency Medicine

## 2013-05-30 DIAGNOSIS — Z79899 Other long term (current) drug therapy: Secondary | ICD-10-CM | POA: Insufficient documentation

## 2013-05-30 DIAGNOSIS — N390 Urinary tract infection, site not specified: Secondary | ICD-10-CM | POA: Insufficient documentation

## 2013-05-30 DIAGNOSIS — Z862 Personal history of diseases of the blood and blood-forming organs and certain disorders involving the immune mechanism: Secondary | ICD-10-CM | POA: Insufficient documentation

## 2013-05-30 DIAGNOSIS — R7989 Other specified abnormal findings of blood chemistry: Secondary | ICD-10-CM | POA: Insufficient documentation

## 2013-05-30 DIAGNOSIS — D696 Thrombocytopenia, unspecified: Secondary | ICD-10-CM | POA: Insufficient documentation

## 2013-05-30 DIAGNOSIS — Z8639 Personal history of other endocrine, nutritional and metabolic disease: Secondary | ICD-10-CM | POA: Insufficient documentation

## 2013-05-30 DIAGNOSIS — R945 Abnormal results of liver function studies: Secondary | ICD-10-CM

## 2013-05-30 DIAGNOSIS — F411 Generalized anxiety disorder: Secondary | ICD-10-CM | POA: Insufficient documentation

## 2013-05-30 DIAGNOSIS — Z3202 Encounter for pregnancy test, result negative: Secondary | ICD-10-CM | POA: Insufficient documentation

## 2013-05-30 DIAGNOSIS — R109 Unspecified abdominal pain: Secondary | ICD-10-CM

## 2013-05-30 DIAGNOSIS — R0602 Shortness of breath: Secondary | ICD-10-CM | POA: Insufficient documentation

## 2013-05-30 DIAGNOSIS — K5900 Constipation, unspecified: Secondary | ICD-10-CM

## 2013-05-30 HISTORY — DX: Polycystic ovarian syndrome: E28.2

## 2013-05-30 LAB — CBC WITH DIFFERENTIAL/PLATELET
BASOS PCT: 0 % (ref 0–1)
Basophils Absolute: 0 10*3/uL (ref 0.0–0.1)
EOS ABS: 0 10*3/uL (ref 0.0–0.7)
Eosinophils Relative: 0 % (ref 0–5)
HEMATOCRIT: 43.7 % (ref 36.0–46.0)
HEMOGLOBIN: 15.4 g/dL — AB (ref 12.0–15.0)
LYMPHS ABS: 0.7 10*3/uL (ref 0.7–4.0)
Lymphocytes Relative: 8 % — ABNORMAL LOW (ref 12–46)
MCH: 34.1 pg — AB (ref 26.0–34.0)
MCHC: 35.2 g/dL (ref 30.0–36.0)
MCV: 96.9 fL (ref 78.0–100.0)
MONO ABS: 0.6 10*3/uL (ref 0.1–1.0)
MONOS PCT: 6 % (ref 3–12)
NEUTROS PCT: 85 % — AB (ref 43–77)
Neutro Abs: 7.6 10*3/uL (ref 1.7–7.7)
Platelets: 65 10*3/uL — ABNORMAL LOW (ref 150–400)
RBC: 4.51 MIL/uL (ref 3.87–5.11)
RDW: 13.9 % (ref 11.5–15.5)
WBC: 8.9 10*3/uL (ref 4.0–10.5)

## 2013-05-30 LAB — COMPREHENSIVE METABOLIC PANEL
ALBUMIN: 3.2 g/dL — AB (ref 3.5–5.2)
ALK PHOS: 133 U/L — AB (ref 39–117)
ALT: 81 U/L — ABNORMAL HIGH (ref 0–35)
AST: 66 U/L — ABNORMAL HIGH (ref 0–37)
BILIRUBIN TOTAL: 1 mg/dL (ref 0.3–1.2)
BUN: 12 mg/dL (ref 6–23)
CHLORIDE: 103 meq/L (ref 96–112)
CO2: 27 mEq/L (ref 19–32)
CREATININE: 0.66 mg/dL (ref 0.50–1.10)
Calcium: 9 mg/dL (ref 8.4–10.5)
GFR calc non Af Amer: >90 mL/min (ref >90)
GLUCOSE: 130 mg/dL — AB (ref 70–99)
POTASSIUM: 4.2 meq/L (ref 3.7–5.3)
Sodium: 140 mEq/L (ref 137–147)
TOTAL PROTEIN: 6.7 g/dL (ref 6.0–8.3)

## 2013-05-30 LAB — URINALYSIS, ROUTINE W REFLEX MICROSCOPIC
Bilirubin Urine: NEGATIVE
Glucose, UA: NEGATIVE mg/dL
Ketones, ur: NEGATIVE mg/dL
Nitrite: NEGATIVE
Protein, ur: NEGATIVE mg/dL
Specific Gravity, Urine: 1.021 (ref 1.005–1.030)
Urobilinogen, UA: 1 mg/dL (ref 0.0–1.0)
pH: 6.5 (ref 5.0–8.0)

## 2013-05-30 LAB — URINE MICROSCOPIC-ADD ON

## 2013-05-30 LAB — POC URINE PREG, ED: PREG TEST UR: NEGATIVE

## 2013-05-30 LAB — LIPASE, BLOOD: Lipase: 44 U/L (ref 11–59)

## 2013-05-30 MED ORDER — SODIUM CHLORIDE 0.9 % IV SOLN
INTRAVENOUS | Status: DC
  Administered 2013-05-30: 23:00:00 via INTRAVENOUS

## 2013-05-30 NOTE — ED Notes (Signed)
Used interpreter phone for Spanish speaking pt, Pt reports upper abdominal pain increased for the past week, has had this similar pain in the past with swelling to her lower extremities, was told that she had a "swollen liver" in [place]. She has been told by doctors in the [place] that she does not have any of these problems. Pt has a hx of PCOS as well. Pt is taking medications from her country for her symptoms. Pt a&o x4, skin warm and dry, reports nausea, NAD noted at this time

## 2013-05-30 NOTE — ED Notes (Signed)
Initial Contact - pt to [facility]4 with family, reports diffuse abd pain 7/10, also c/o nausea and swelling to face, legs.  Abd distended, round, semi-firm.  +bsx4 quads.  Pt reports "they told me it's my liver" and reports taking medications from [place] for same.  Pt denies v/d/c, denies fevers/chills.  Speaking full/clear sentences, rr even/un-lab.  NAD.

## 2013-05-30 NOTE — ED Provider Notes (Signed)
CSN: [id number]     Arrival date & time 05/30/13  2116 History   First MD Initiated Contact with Patient 05/30/13 2229     Chief Complaint  Patient presents with  . Abdominal Pain     (Consider location/radiation/quality/duration/timing/severity/associated sxs/prior Treatment) HPI Megan Fuller is a 28 y.o. female who presents to ED with complaint of diffuse abdominal pain. States pain for few weeks. States history of the same. States [place] liver problems. She was started on Ursotec Forte (ursodeoxycholic acid). Also taking desloratadine also given to [place] Pt states the reason she came to ED is because now she noted her face, arms, legs began to swell and she is having hard time breathing. Denies fever, chills, cough. No nausea, vomiting. No changes in bowels. [place]. No other complaints.    Past Medical History  Diagnosis Date  . PCOS (polycystic ovarian syndrome)    History reviewed. No pertinent past surgical history. History reviewed. No pertinent family history. History  Substance Use Topics  . Smoking status: Never Smoker   . Smokeless tobacco: Never Used  . Alcohol Use: No   OB History   Grav Para Term Preterm Abortions TAB SAB Ect Mult Living                 Review of Systems  Constitutional: Negative for fever and chills.  Respiratory: Positive for shortness of breath. Negative for cough and chest tightness.   Cardiovascular: Negative for chest pain, palpitations and leg swelling.  Gastrointestinal: Positive for abdominal pain and abdominal distention. Negative for nausea, vomiting and diarrhea.  Genitourinary: Negative for dysuria, flank pain and pelvic pain.  Musculoskeletal: Negative for arthralgias, myalgias, neck pain and neck stiffness.  Skin: Negative for rash.  Neurological: Negative for dizziness, weakness and headaches.  All other systems reviewed and are negative.      Allergies   Review of patient's allergies indicates no known allergies.  Home Medications   Current Outpatient Rx  Name  Route  Sig  Dispense  Refill  . PRESCRIPTION MEDICATION   Oral   Take 250 mg by mouth daily. Ursotec Forte 250 mg capsule (made in [place]         . PRESCRIPTION MEDICATION   Oral   Take 5 mg by mouth daily. Histadel-D 5 mg (made in [place]          BP 152/96  Pulse 99  Temp(Src) 98.6 F (37 C) (Oral)  Resp 19  SpO2 99%  LMP 04/01/2013 Physical Exam  Nursing note and vitals reviewed. Constitutional: She is oriented to person, place, and time. She appears well-developed and well-nourished. No distress.  HENT:  Head: Normocephalic.  Eyes: Conjunctivae are normal.  Neck: Neck supple.  Cardiovascular: Normal rate, regular rhythm and normal heart sounds.   Pulmonary/Chest: Effort normal and breath sounds normal. No respiratory distress. She has no wheezes. She has no rales.  Abdominal: Soft. Bowel sounds are normal. She exhibits distension. There is tenderness. There is no rebound and no guarding.  Diffuse tenderness  Musculoskeletal: She exhibits no edema.  Neurological: She is alert and oriented to person, place, and time.  Skin: Skin is warm and dry.  Psychiatric: She has a normal mood and affect. Her behavior is normal.  Appears anxious    ED Course  Procedures (including critical care time) Labs Review Labs Reviewed  URINALYSIS, ROUTINE W REFLEX MICROSCOPIC - Abnormal;  Notable for the following:    Color, Urine AMBER (*)    APPearance CLOUDY (*)    Hgb urine dipstick LARGE (*)    Leukocytes, UA MODERATE (*)    All other components within normal limits  URINE MICROSCOPIC-ADD ON - Abnormal; Notable for the following:    Squamous Epithelial / LPF MANY (*)    Bacteria, UA FEW (*)    All other components within normal limits  CBC WITH DIFFERENTIAL  COMPREHENSIVE METABOLIC PANEL  LIPASE, BLOOD  POC URINE PREG, ED   Imaging Review Dg Chest 2  View  05/30/2013   CLINICAL DATA:  Abdominal pain.  EXAM: CHEST  2 VIEW  COMPARISON:  None.  FINDINGS: Lungs are adequately inflated without focal consolidation or effusion. Cardiomediastinal silhouette is within normal. There is a right-sided cervical rib present. Remainder of the exam is unremarkable.  IMPRESSION: No active cardiopulmonary disease.   Electronically Signed   By: Elberta Fortisaniel  Boyle M.D.   On: 05/30/2013 23:06   Dg Abd 2 Views  05/30/2013   CLINICAL DATA:  Abdominal pain.  EXAM: ABDOMEN - 2 VIEW  COMPARISON:  None.  FINDINGS: Exam demonstrates moderate fecal retention throughout the colon without evidence of obstruction. There is moderate just a material within the stomach. There is no free peritoneal air. There are no dilated small bowel loops. Remaining bones are unremarkable.  IMPRESSION: Moderate fecal retention throughout the colon without evidence of obstruction.   Electronically Signed   By: Elberta Fortisaniel  Boyle M.D.   On: 05/30/2013 23:08     EKG Interpretation None      MDM   Final diagnoses:  Abdominal pain  Constipation  Elevated LFTs  Thrombocytopenia  UTI (lower urinary tract infection)    Patient with abdominal pain for several weeks and now swelling in her arms, legs, face, which is subjective. I do not see any obvious swelling on exam, no pitting edema. Abdomen is diffusely tender. Labs showing elevated LFTs and thrombocytopenia which is the patient's baseline. Apparently patient has been seen by Dr. Clelia CroftShadad in the past regarding her thrombocytopenia. No clear diagnosis made but she was supposed to followup with him in 6 months, she did not. When I asked her why she didn't followup, patient states that "they weren't treating it with anything, so I didn't go back." Labs are otherwise unremarkable, and elevated white blood count. Lipase is normal. Urinalysis obtained, most likely infected, however is contaminated. Urine pregnancy is negative. I will start her Keflex for her UTI.  X-rays of her chest and abdomen were obtained. X-ray of the abdomen showing large amount of stool in the colon. I have reviewed myself. This is most likely was causing patient's distention and pain. He is otherwise nontoxic appearing, her abdomen is soft. At this time I do not think any further imaging is indicated. Will start her on magnesium citrate for constipation. Instructed to followup with her primary care Dr. and Dr. Clelia CroftShadad for further evaluation. Patient voiced understanding. Interpreter phone was used to discuss patient's results and plan.   Filed Vitals:   05/30/13 2137 05/31/13 0119  BP: 152/96 133/81  Pulse: 99 99  Temp: 98.6 F (37 C) 97.7 F (36.5 C)  TempSrc: Oral Oral  Resp: 19 18  SpO2: 99% 99%     Marquesha Robideau A Hensley Aziz, PA-C 05/31/13 0130

## 2013-05-31 MED ORDER — CEPHALEXIN 250 MG PO CAPS: 250.0000 mg | ORAL_CAPSULE | Freq: Four times a day (QID) | ORAL | Status: DC

## 2013-05-31 MED ORDER — MAGNESIUM CITRATE PO SOLN: 1.0000 | Freq: Once | ORAL | Status: DC

## 2013-05-31 NOTE — Discharge Instructions (Signed)
Magnesium citrate for constipation. Keflex for urinary tract infection. Follow up with a primary care doctor and Dr. Clelia Croft regarding your liver function tests and thrombocytopenia.   Constipacin - Adulto  (Constipation, Adult)  Constipacin significa que una persona tiene menos de 3 evacuaciones en una semana, hay dificultad para evacuar el intestino, o las heces son secas, duras, o ms grandes que lo normal. A medida que envejecemos el estreimiento es ms comn. Si intenta curar el estreimiento con medicamentos que producen la evacuacin intestinal (laxantes), el problema puede empeorar. El uso prolongado de laxantes puede hacer que los msculos del colon se debiliten. Una dieta baja en fibra, no tomar suficientes lquidos y el uso de ciertos medicamentos pueden Scientist, research (life sciences).  CAUSAS   Ciertos medicamentos, como los antidepresivos, analgsicos, suplementos de hierro, anticidos y diurticos.   Algunas enfermedades, como la diabetes, el sndrome del colon irritable (SII), enfermedad de la tiroides, o depresin.   No beber suficiente agua.   No consumir suficientes alimentos ricos en fibra.   Situaciones de estrs o viajes.  Falta de actividad fsica o de ejercicio.  No ir al bao cuando siente la necesidad.  Ignorar la necesidad sbita de Licensed conveyancer intestino.  Uso en exceso de laxantes. SNTOMAS   Evacuar el intestino menos de 3 veces a la semana.   Dificultad para mover el intestino   Tener las heces secas y duras, o ms grandes que las normales.   Sensacin de estar lleno o distendido.   Dolor en la parte baja del abdomen  No se siente alivio despus de evacuar el intestino. DIAGNSTICO  El mdico le har una historia clnica y le har un examen fsico. Pueden hacerle exmenes adicionales para el estreimiento grave. Algunas pruebas son:   Un radiografa con enema de bario para examinar el recto, el colon y en algunos casos el intestino  delgado.  Una sigmoidoscopia para examinar el colon inferior.  Una colonoscopia para examinar todo el colon. TRATAMIENTO  El tratamiento depender de la gravedad de la constipacin y de la causa. Algunos tratamientos dietticos son beber ms lquidos y comer ms alimentos ricos en fibra. El cambio en el estilo de vida incluye hacer ejercicios de [place] regular. Si estas recomendaciones para Public relations account executive dieta y en el estilo de vida no ayudan, el mdico le puede indicar el uso de laxantes de venta libre para Pharmacologist movimiento intestinal. Los medicamentos con Engineer, drilling se pueden prescribir si los medicamentos de venta libre no lo mejoran.  INSTRUCCIONES PARA EL CUIDADO EN EL HOGAR   Aumente el consumo de alimentos con [place], como frutas, verduras, granos enteros y frijoles. Limite los azcares ricos en grasas y procesados   en su dieta, tales como papas fritas, hamburguesas, galletas, dulces y refrescos.   Puede agregar un suplemento de fibra a su dieta si no obtiene lo suficiente de los alimentos.   Debe ingerir gran cantidad de lquido para mantener la orina de tono claro o color amarillo plido.   Haga ejercicios regularmente o segn las indicaciones de su mdico.   Vaya al bao cuando sienta la necesidad de ir. No espere.  Tome slo la medicacin que le indic el profesional.  No tome otros medicamentos para la constipacin sin Science writer a su mdico. SOLICITE ATENCIN MDICA DE INMEDIATO SI:   Observa sangre brillante en las heces.   La constipacin dura ms de 4 das o [address].   Siente dolor abdominal o rectal.   Las heces son  delgadas como un lpiz.  Pierde peso de [place] inexplicable. ASEGRESE DE QUE:   Comprende estas instrucciones.  Controlar su enfermedad.  Solicitar ayuda de inmediato si no mejora o empeora. Document Released: 03/03/2007 Document Revised: 08/13/2011 [facility] Patient Information 2014 [place], [place].

## 2013-05-31 NOTE — ED Provider Notes (Signed)
Medical screening examination/treatment/procedure(s) were performed by non-physician practitioner and as supervising physician I was immediately available for consultation/collaboration.   EKG Interpretation None        Audyn Dimercurio S Camay Pedigo, MD 05/31/13 1134 

## 2014-02-10 ENCOUNTER — Ambulatory Visit (INDEPENDENT_AMBULATORY_CARE_PROVIDER_SITE_OTHER): Payer: Self-pay | Admitting: Obstetrics & Gynecology

## 2014-02-10 ENCOUNTER — Encounter: Payer: Self-pay | Admitting: Obstetrics & Gynecology

## 2014-02-10 VITALS — BP 134/78 | HR 87 | Wt 159.5 lb

## 2014-02-10 DIAGNOSIS — E282 Polycystic ovarian syndrome: Secondary | ICD-10-CM | POA: Insufficient documentation

## 2014-02-10 DIAGNOSIS — Z113 Encounter for screening for infections with a predominantly sexual mode of transmission: Secondary | ICD-10-CM

## 2014-02-10 DIAGNOSIS — N898 Other specified noninflammatory disorders of vagina: Secondary | ICD-10-CM

## 2014-02-10 MED ORDER — MEDROXYPROGESTERONE ACETATE 10 MG PO TABS: 10.0000 mg | ORAL_TABLET | Freq: Every day | ORAL | Status: DC

## 2014-02-10 NOTE — Patient Instructions (Signed)
Sndrome ovrico poliqustico (Polycystic Ovarian Syndrome) El sndrome ovrico poliqustico (PCOS) es un trastorno hormonal comn en mujeres en edad reproductiva. La mayora de las mujeres con este sndrome desarrolla pequeos quistes en sus ovarios. Esto puede ocasionar problemas en la menstruacin y dificultades para quedar embarazada. Tambin puede aumentar el riesgo de aborto espontneo. Si no se trata, el sndrome ovrico poliqustico puede acarrear graves problemas de [place], como diabetes y enfermedades del Training and development officer. CAUSAS La causa de este sndrome no se conoce, pero podra haber un factor gentico. SIGNOS Y SNTOMAS   Perodos menstruales irregulares o ausentes.   Incapacidad para quedar embarazada (infertilidad) debido a la falta de ovulacin   Aumento del crecimiento del vello en el rostro, el trax, el estmago, la espalda, los muslos o los dedos de los pies.   Acn, piel grasa o caspa.   Dolor plvico.   Ganancia de peso u obesidad, por lo general, sobrepeso localizado alrededor de la cintura.   Diabetes tipo 2.   Colesterol elevado.   Hipertensin arterial.   Calvicie femenina o cabello muy fino.   Manchas de piel gruesa marrn oscura o negra en el cuello, brazos, pechos o caderas.   Pequeos excesos de piel suelta (plipos cutneos) en las axilas o en el pecho.   Ronquidos excesivos e interrupcin de la respiracin durante el sueo (apnea del sueo).   Tonalidad grave de Microbiologist.   Diabetes gestacional durante el embarazo.  DIAGNSTICO  No hay una nica prueba para diagnosticar el sndrome ovrico poliqustico.   El profesional que lo asiste:   Optometrist una historia clnica.   Realizar un examen plvico   Realizar una prueba de [place].   Controlar sus niveles de hormonas femeninas y [place].   Medir la glucosa o los niveles de Museum/gallery exhibitions officer.   Realizar otros anlisis de [place].   Si est produciendo demasiadas  hormonas masculinas, el mdico se asegurar de que se debe al sndrome ovrico poliqustico. Durante el examen fsico, el mdico evaluar las zonas de aumento de vello. Permita que se Hydrographic surveyor natural del vello [employer] previos a la visita.   Durante el examen plvico, los ovarios podrn agrandarse o hincharse debido al aumento del nmero de pequeos quistes. Esto puede observarse ms fcilmente mediante la utilizacin de ultrasonido vaginal para examinar los ovarios y las paredes del tero (endometrio) en busca de quistes. Las paredes del tero pueden engrosarse si no tiene un perodo menstrual regular.  TRATAMIENTO  Debido a que no hay cura para el sndrome ovrico poliqustico, debe controlarse para Customer service manager. Los tratamientos se basan en los sntomas. Tambin se basa en su deseo de tener un beb o usar anticonceptivos.  El tratamiento puede incluir:   Hormona progesterona, para iniciar un periodo menstrual.   Pldoras anticonceptivas, para Contractor.   Medicamentos para estimular la ovulacin, si quiere quedar embarazada.   Medicamentos para Optician, dispensing.   Medicamentos para Aeronautical engineer presin arterial.   Medicamentos y dietas para Chief Technology Officer los altos niveles de colesterol y triglicridos en la sangre.  Medicamentos para reducir el crecimiento excesivo de vello.  [place], realizando pequeos cortes en el ovario, para disminuir la produccin de hormona masculina. Esto se realiza a travs de un tubo largo que ilumina (laparoscopio) que se coloca en la pelvis a travs de una pequea incisin en la zona inferior del abdomen.  [place] slo medicamentos de venta libre o recetados,  segn las indicaciones del mdico.  Preste atencin a su alimentacin y a sus niveles de actividad fsica. Esto puede ayudar a reducir los efectos del sndrome ovrico poliqustico.  Controle su  peso.  Consuma alimentos bajos en carbohidratos y altos en contenido de fibra.  Haga ejercicios regularmente. SOLICITE ATENCIN MDICA SI:  Los sntomas no mejoran con los medicamentos.  Aparecen nuevos sntomas. Document Released: 05/30/2008 Document Revised: 12/02/2012 ExitCare Patient Information 2015 ExitCare, LLC. This information is not intended to replace advice given to you by your health care provider. Make sure you discuss any questions you have with your health care provider.  

## 2014-02-10 NOTE — Progress Notes (Signed)
Pt reports that she doesn't have periods and can't remember when the last one was.

## 2014-02-10 NOTE — Progress Notes (Signed)
Patient ID: Nobie PutnamRina I Fuller, female   DOB: 07/31/1985, 28 y.o.   MRN: [id number]  Chief Complaint  Patient presents with  . pcos   HPI Megan Fuller is a 28 y.o. female.  Dx with PCOS and LMP unsure, has had hormonal management in the past, doesn't want BCM now  HPI  Past Medical History  Diagnosis Date  . PCOS (polycystic ovarian syndrome)     History reviewed. No pertinent past surgical history.  History reviewed. No pertinent family history.  Social History History  Substance Use Topics  . Smoking status: Never Smoker   . Smokeless tobacco: Never Used  . Alcohol Use: No    No Known Allergies  Current Outpatient Prescriptions  Medication Sig Dispense Refill  . medroxyPROGESTERone (PROVERA) 10 MG tablet Take 1 tablet (10 mg total) by mouth daily. Use for ten days 10 tablet 2   No current facility-administered medications for this visit.    Review of Systems Review of Systems  Genitourinary: Positive for menstrual problem. Negative for vaginal discharge and pelvic pain.    Blood pressure 134/78, pulse 87, weight 159 lb 8 oz (72.349 kg).  Physical Exam Physical Exam  Constitutional: She is oriented to person, place, and time. She appears well-developed. No distress.  Pulmonary/Chest: Effort normal.  Abdominal: Soft. She exhibits no distension and no mass. There is no tenderness.  Genitourinary: Vagina normal and uterus normal. No vaginal discharge found.  No mass  Neurological: She is alert and oriented to person, place, and time.  Skin: Skin is warm and dry.  Psychiatric: She has a normal mood and affect. Her behavior is normal.       Assessment    Oligomenorrhea, PCOS likely     Plan    Progestin challenge        ARNOLD,JAMES 02/10/2014, 4:41 PM

## 2014-02-11 LAB — CERVICOVAGINAL ANCILLARY ONLY
Wet Prep (BD Affirm): NEGATIVE
Wet Prep (BD Affirm): NEGATIVE
Wet Prep (BD Affirm): NEGATIVE

## 2014-02-15 ENCOUNTER — Ambulatory Visit (HOSPITAL_COMMUNITY)
Admission: RE | Admit: 2014-02-15 | Discharge: 2014-02-15 | Disposition: A | Discharge status: Home / Self Care | Payer: Self-pay | Source: Ambulatory Visit | Attending: Obstetrics & Gynecology | Admitting: Obstetrics & Gynecology

## 2014-02-15 DIAGNOSIS — E282 Polycystic ovarian syndrome: Secondary | ICD-10-CM | POA: Insufficient documentation

## 2014-02-15 DIAGNOSIS — N915 Oligomenorrhea, unspecified: Secondary | ICD-10-CM | POA: Insufficient documentation

## 2014-03-07 ENCOUNTER — Encounter: Payer: Self-pay | Admitting: Obstetrics & Gynecology

## 2014-03-07 ENCOUNTER — Ambulatory Visit (INDEPENDENT_AMBULATORY_CARE_PROVIDER_SITE_OTHER): Payer: Self-pay | Admitting: Obstetrics & Gynecology

## 2014-03-07 VITALS — BP 129/79 | HR 81 | Wt 160.9 lb

## 2014-03-07 DIAGNOSIS — E282 Polycystic ovarian syndrome: Secondary | ICD-10-CM

## 2014-03-07 LAB — TSH: TSH: 1.603 u[IU]/mL (ref 0.350–4.500)

## 2014-03-07 MED ORDER — NORGESTIM-ETH ESTRAD TRIPHASIC 0.18/0.215/0.25 MG-25 MCG PO TABS: 1.0000 | ORAL_TABLET | Freq: Every day | ORAL | Status: DC

## 2014-03-07 NOTE — Patient Instructions (Signed)
Uso de los anticonceptivos orales (Oral Contraception Use) Los anticonceptivos orales (ACO) son medicamentos que se utilizan para evitar el embarazo. Su funcin es evitar que los ovarios liberen vulos. Las hormonas de los ACO tambin hacen que el moco cervical se haga ms espeso, lo que evita que el esperma ingrese al tero. Tambin hacen que la membrana que recubre internamente al tero se vuelva ms fina, lo que no permite que el huevo fertilizado se adhiera a la pared del tero. Los ACO son muy efectivos cuando se toman exactamente como se prescriben. Sin embargo, no previenen contra las enfermedades de transmisin sexual (ETS). La prctica del sexo seguro, como el uso de preservativos, junto con los ACO, ayudan a prevenir ese tipo de enfermedades. Antes de tomar ACO, debe hacerse un examen fsico y un [place]apanicolau. El mdico podr indicarle anlisis de sangre, si es necesario. El mdico se asegurar de que usted sea una buena candidata para usar anticonceptivos orales. Converse con su mdico acerca de los posibles efectos secundarios de los ACO que podran recetarle. Cuando se inicia el uso de ACO, se pueden tomar durante 2 a 3 meses para que el cuerpo se adapte a los cambios en los niveles hormonales en el cuerpo.  CMO TOMAR LOS ANTICONCEPTIVOS ORALES El mdico le indicar como comenzar a tomar el primer ciclo de ACO. De lo contrario usted puede:   Comenzar el da de inicio del ciclo menstrual. No necesitar proteccin anticonceptiva adicional al comenzar en este momento.   Comenzar el primer domingo luego de su perodo menstrual, o el da en que adquiere el medicamento. En estos casos deber tener proteccin anticonceptiva adicional durante los primeros 7 das del ciclo.   Comenzar a tomarlos en cualquier momento del ciclo. Si toma el anticonceptivo dentro de los 5 das de iniciado el perodo, estar protegida de quedar embarazada inmediatamente. En este caso, no necesitar una forma adicional de  anticonceptivos. Si comienza en cualquier otro momento del ciclo menstrual, necesitar usar otra forma de anticonceptivo durante 7 das. Si sus ACO son del tipo de los llamados minipldoras, podrn impedir el embarazo despus de tomarlas por 2 das (48 horas). Luego de comenzar a tomar los ACO:   Si olvid de tomar 1 pldora, tmela tan pronto como lo recuerde. Tome la siguiente pldora a la hora habitual.   Si dej de tomar 2 o ms pldoras, comunquese con su mdico ya que diferentes pldoras tienen diferentes instrucciones para las dosis que no se han tomado. Si olvida tomar 2 o ms pldoras, utilice un mtodo anticonceptivo adicional hasta que comience su prximo perodo menstrual.   Si utiliza el envase de 28 pldoras que contienen pldoras inactivas y olvida tomar 1 de las ltimas 7 (pldoras sin hormonas), sto no tiene importancia. Simplemente deseche el resto de las pldoras que no contienen hormonas y comience un nuevo envase.  No importa cuando comience a tomar los anticonceptivos, siempre empiece un nuevo envase el mismo da de la semana. Tenga un envase extra de ACO y use un mtodo anticonceptivo adicional para el caso en que se olvide de tomar algunas pldoras o pierda la caja.  INSTRUCCIONES PARA EL CUIDADO EN EL HOGAR   No fume.   Use siempre un condn para protegerse contra las enfermedades de transmisin sexual. Los ACO no protegen contra las enfermedades de transmisin sexual.   Use un almanaque para marcar los das de su perodo menstrual.   Lea la informacin y consejos que vienen con las ACO. Hable con   el profesional si tiene dudas.  SOLICITE ATENCIN MDICA SI:   Presenta nuseas o vmitos.   Tiene flujo o sangrado vaginal anormal.   Aparece una erupcin cutnea.   No tiene el perodo menstrual.   Pierde el cabello.   Necesita tratamiento por cambios en su estado de nimo o por depresin.   Se siente mareada al tomar los ACO.   Comienza a  aparecer acn con el uso de los ACO.   Queda embarazada.  SOLICITE ATENCIN MDICA DE INMEDIATO SI:   Siente dolor en el pecho.   Le falta el aire.   Le duele mucho la cabeza y no puede controlar el dolor.   Siente adormecimiento o tiene dificultad para hablar.   Tiene problemas de visin.   Presenta dolor, inflamacin o hinchazn en las piernas.  Document Released: 01/31/2011 Document Revised: 10/14/2012 ExitCare Patient Information 2015 ExitCare, LLC. This information is not intended to replace advice given to you by your health care provider. Make sure you discuss any questions you have with your health care provider.  

## 2014-03-07 NOTE — Progress Notes (Signed)
Subjective:     Patient ID: Megan Fuller, female   DOB: 02/06/1986, 29 y.o.   MRN: [id number]  HPINo obstetric history on file. G0. Patient's last menstrual period was 03/07/2014 (exact date). She took MPA and had withdrawal. She is concerned about weight gain. She does not want referral for infertility.   Review of Systems  Constitutional: Positive for unexpected weight change.  Psychiatric/Behavioral: Positive for dysphoric mood. The patient is nervous/anxious.        Objective:   Physical Exam  Constitutional: She is oriented to person, place, and time. She appears well-developed. No distress.  Pulmonary/Chest: Effort normal. No respiratory distress.  Neurological: She is alert and oriented to person, place, and time.  Psychiatric: She has a normal mood and affect. Her behavior is normal.    CLINICAL DATA: Polycystic ovarian syndrome. Oligomenorrhea.  EXAM: TRANSABDOMINAL AND TRANSVAGINAL ULTRASOUND OF PELVIS  TECHNIQUE: Both transabdominal and transvaginal ultrasound examinations of the pelvis were performed. Transabdominal technique was performed for global imaging of the pelvis including uterus, ovaries, adnexal regions, and pelvic cul-de-sac. It was necessary to proceed with endovaginal exam following the transabdominal exam to visualize the endometrial stripe and ovaries.  COMPARISON: 05/20/2008  FINDINGS: Uterus  Measurements: 6.0 x 4.0 x 3.5 cm. No fibroids or other mass visualized.  Endometrium  Thickness: 9 mm. No focal abnormality visualized.  Right ovary  Measurements: 4.3 x 2.0 x 2.3 cm. Multiple small less than 1 cm follicles seen peripherally within the right ovary. No dominant follicle or other mass visualized.  Left ovary  Measurements: 2.8 x 1.9 x 2.0 cm. Several small less than 1 cm follicles are seen. No dominant follicle or other mass visualized.  Other findings  No free fluid.  IMPRESSION: Multiple small  bilateral ovarian follicles, without evidence of dominant follicle or corpus luteum. These findings are consistent with polycystic ovary syndrome; recommend clinical correlation for clinical signs and symptoms of PCOS, and consider biochemical testing for confirmation if warranted.  Normal appearance of uterus. No pelvic mass identified.   Electronically Signed  By: Myles RosenthalJohn Stahl M.D.  On: 02/15/2014 15:40        Assessment:     PCOS     Plan:     Agrees to OCP for cycle control. TSH drawn. Recommend weight loss  Adam PhenixJames G Arnold, MD 03/07/2014

## 2014-05-16 ENCOUNTER — Ambulatory Visit (INDEPENDENT_AMBULATORY_CARE_PROVIDER_SITE_OTHER): Payer: Self-pay | Admitting: Obstetrics & Gynecology

## 2014-05-16 VITALS — BP 125/90 | HR 76 | Temp 98.3°F | Wt 156.1 lb

## 2014-05-16 DIAGNOSIS — E282 Polycystic ovarian syndrome: Secondary | ICD-10-CM

## 2014-05-16 MED ORDER — FLUCONAZOLE 150 MG PO TABS: 150.0000 mg | ORAL_TABLET | Freq: Once | ORAL | Status: DC

## 2014-05-16 MED ORDER — DROSPIRENONE-ETHINYL ESTRADIOL 3-0.03 MG PO TABS: 1.0000 | ORAL_TABLET | Freq: Every day | ORAL | Status: DC

## 2014-05-16 NOTE — Progress Notes (Signed)
Patient ID: Nobie PutnamRina I Pineda-Lovo, female   DOB: 04/14/1985, 29 y.o.   MRN: [id number] J. sowell Spanish Interpreter for encounter

## 2014-05-16 NOTE — Progress Notes (Signed)
Subjective:     Patient ID: Megan PutnamRina I Pineda-Lovo, female   DOB: 08/04/1985, 29 y.o.   MRN: [id number]  HPI No obstetric history on file. Patient's last menstrual period was 05/11/2014. C/o nausea and fatigue with her OCP but cycle control is good  Review of Systems  Constitutional: Positive for fatigue.  Gastrointestinal: Positive for nausea.  Genitourinary: Negative for menstrual problem and pelvic pain.       Objective:   Physical Exam  Constitutional: She is oriented to person, place, and time. She appears well-developed. No distress.  Neurological: She is alert and oriented to person, place, and time.  Psychiatric: She has a normal mood and affect. Her behavior is normal.       Assessment:     PCOS with unacceptable side-effects on OCP     Plan:     Try Yasmin and report if not improved  Adam PhenixJames G Arnold, MD 05/16/2014

## 2014-05-16 NOTE — Patient Instructions (Signed)
Polycystic Ovarian Syndrome  Polycystic ovarian syndrome (PCOS) is a common hormonal disorder among women of reproductive age. Most women with PCOS grow many small cysts on their ovaries. PCOS can cause problems with your periods and make it difficult to get pregnant. It can also cause an increased risk of miscarriage with pregnancy. If left untreated, PCOS can lead to serious health problems, such as diabetes and heart disease.  CAUSES  The cause of PCOS is not fully understood, but genetics may be a factor.  SIGNS AND SYMPTOMS    Infrequent or no menstrual periods.    Inability to get pregnant (infertility) because of not ovulating.    Increased growth of hair on the face, chest, stomach, back, thumbs, thighs, or toes.    Acne, oily skin, or dandruff.    Pelvic pain.    Weight gain or obesity, usually carrying extra weight around the waist.    Type 2 diabetes.    High cholesterol.    High blood pressure.    Female-pattern baldness or thinning hair.    Patches of thickened and dark brown or black skin on the neck, arms, breasts, or thighs.    Tiny excess flaps of skin (skin tags) in the armpits or neck area.    Excessive snoring and having breathing stop at times while asleep (sleep apnea).    Deepening of the voice.    Gestational diabetes when pregnant.   DIAGNOSIS   There is no single test to diagnose PCOS.    Your health care provider will:    Take a medical history.    Perform a pelvic exam.    Have ultrasonography done.    Check your female and female hormone levels.    Measure glucose or sugar levels in the blood.    Do other blood tests.    If you are producing too many female hormones, your health care provider will make sure it is from PCOS. At the physical exam, your health care provider will want to evaluate the areas of increased hair growth. Try to allow natural hair growth for a few days before the visit.    During a pelvic exam, the ovaries may be  enlarged or swollen because of the increased number of small cysts. This can be seen more easily by using vaginal ultrasonography or screening to examine the ovaries and lining of the uterus (endometrium) for cysts. The uterine lining may become thicker if you have not been having a regular period.   TREATMENT   Because there is no cure for PCOS, it needs to be managed to prevent problems. Treatments are based on your symptoms. Treatment is also based on whether you want to have a baby or whether you need contraception.   Treatment may include:    Progesterone hormone to start a menstrual period.    Birth control pills to make you have regular menstrual periods.    Medicines to make you ovulate, if you want to get pregnant.    Medicines to control your insulin.    Medicine to control your blood pressure.    Medicine and diet to control your high cholesterol and triglycerides in your blood.   Medicine to reduce excessive hair growth.   Surgery, making small holes in the ovary, to decrease the amount of female hormone production. This is done through a long, lighted tube (laparoscope) placed into the pelvis through a tiny incision in the lower abdomen.   HOME CARE INSTRUCTIONS   Only   take over-the-counter or prescription medicine as directed by your health care provider.   Pay attention to the foods you eat and your activity levels. This can help reduce the effects of PCOS.   Keep your weight under control.   Eat foods that are low in carbohydrate and high in fiber.   Exercise regularly.  SEEK MEDICAL CARE IF:   Your symptoms do not get better with medicine.   You have new symptoms.  Document Released: 06/07/2004 Document Revised: 12/02/2012 Document Reviewed: 07/30/2012  ExitCare Patient Information 2015 [employer]. This information is not intended to replace advice given to you by your health care provider. Make sure you discuss any questions you have with your health care provider.

## 2014-05-17 ENCOUNTER — Telehealth: Payer: Self-pay | Admitting: *Deleted

## 2014-05-17 NOTE — Telephone Encounter (Addendum)
Pt left message in Spanish. Interpreter Albertina SenegalMarly Adams listened to the message and stated that pt is calling because the Rx given by Dr. [name] [facility] She wants to be sure that he wants her to have this medication or is there anything less expensive. She has purchased one pack of pills at this price. I consulted with Dr. [name] and was told that pt needs Yaz, Yasmin or generic equivalent of either birth control pill. I then spoke with pharmacist @ [facility] and was told that all of the pills they have fitting the pt's needs will cost $55-$70. I called [name] and they have a generic for [name] called [name] which will cost the pt $12.25. Rx was e-prescribed and [facility] was [facility] # [phone]. Pt voiced understanding but was not able to write down the address of [name] at the time of the call. I advised pt that I will call her back tomorrow with the information.   3/30  1500 Called pt with interpreter [name]. She was given the name and address of where to pick up her medication. She voiced understanding. She had no questions.

## 2014-05-18 MED ORDER — DROSPIRENONE-ETHINYL ESTRADIOL 3-0.02 MG PO TABS: 1.0000 | ORAL_TABLET | Freq: Every day | ORAL | Status: DC

## 2014-07-06 ENCOUNTER — Ambulatory Visit (INDEPENDENT_AMBULATORY_CARE_PROVIDER_SITE_OTHER): Payer: Self-pay | Admitting: Obstetrics & Gynecology

## 2014-07-06 ENCOUNTER — Encounter: Payer: Self-pay | Admitting: Obstetrics & Gynecology

## 2014-07-06 VITALS — BP 126/97 | HR 73 | Temp 98.6°F | Ht 66.0 in | Wt 154.1 lb

## 2014-07-06 DIAGNOSIS — E282 Polycystic ovarian syndrome: Secondary | ICD-10-CM

## 2014-07-06 DIAGNOSIS — R350 Frequency of micturition: Secondary | ICD-10-CM

## 2014-07-06 DIAGNOSIS — N898 Other specified noninflammatory disorders of vagina: Secondary | ICD-10-CM

## 2014-07-06 DIAGNOSIS — Z0000 Encounter for general adult medical examination without abnormal findings: Secondary | ICD-10-CM

## 2014-07-06 DIAGNOSIS — R319 Hematuria, unspecified: Secondary | ICD-10-CM

## 2014-07-06 LAB — POCT URINALYSIS DIP (DEVICE)
Bilirubin Urine: NEGATIVE
GLUCOSE, UA: NEGATIVE mg/dL
Ketones, ur: NEGATIVE mg/dL
Leukocytes, UA: NEGATIVE
NITRITE: NEGATIVE
PROTEIN: NEGATIVE mg/dL
SPECIFIC GRAVITY, URINE: 1.015 (ref 1.005–1.030)
Urobilinogen, UA: 1 mg/dL (ref 0.0–1.0)
pH: 6.5 (ref 5.0–8.0)

## 2014-07-06 MED ORDER — DESOGESTREL-ETHINYL ESTRADIOL 0.15-30 MG-MCG PO TABS: 1.0000 | ORAL_TABLET | Freq: Every day | ORAL | Status: DC

## 2014-07-06 NOTE — Progress Notes (Signed)
Pt reports BCP are not effective.  One they are too expensive and that she continues to loose hair.

## 2014-07-06 NOTE — Progress Notes (Signed)
Patient ID: Nobie PutnamRina I Pineda-Lovo, female   DOB: 05/31/1985, 29 y.o.   MRN: [id number]  Chief Complaint  Patient presents with  . Follow-up    PCOS  . Vaginal Discharge    HPI Jaylyne I Pineda-Lovo is a 29 y.o. female.  No obstetric history on file. Patient's last menstrual period was 05/27/2014 (approximate). No menses on Yasmin, O/W tolerated but expensive. "Will try ortho cept.   HPI  Past Medical History  Diagnosis Date  . PCOS (polycystic ovarian syndrome)     No past surgical history on file.  No family history on file.  Social History History  Substance Use Topics  . Smoking status: Never Smoker   . Smokeless tobacco: Never Used  . Alcohol Use: No    No Known Allergies  Current Outpatient Prescriptions  Medication Sig Dispense Refill  . Norgestimate-Ethinyl Estradiol Triphasic 0.18/0.215/0.25 MG-25 MCG tab Take 1 tablet by mouth daily. 1 Package 11  . drospirenone-ethinyl estradiol (YAZ,GIANVI,LORYNA) 3-0.02 MG tablet Take 1 tablet by mouth daily. (Patient not taking: Reported on 07/06/2014) 1 Package 11  . fluconazole (DIFLUCAN) 150 MG tablet Take 1 tablet (150 mg total) by mouth once. (Patient not taking: Reported on 07/06/2014) 1 tablet 0  . medroxyPROGESTERone (PROVERA) 10 MG tablet Take 1 tablet (10 mg total) by mouth daily. Use for ten days (Patient not taking: Reported on 05/16/2014) 10 tablet 2   No current facility-administered medications for this visit.    Review of Systems Review of Systems  Constitutional: Negative.   Gastrointestinal: Negative.   Genitourinary: Positive for dysuria, frequency, vaginal discharge (odor) and pelvic pain. Negative for vaginal bleeding.    Blood pressure 126/97, pulse 73, temperature 98.6 F (37 C), temperature source Oral, height 5\' 6"  (1.676 m), weight 154 lb 1.6 oz (69.899 kg), last menstrual period 05/27/2014.  Physical Exam Physical Exam  Constitutional: She is oriented to person, place, and time. She appears  well-developed. No distress.  Pulmonary/Chest: Effort normal. No respiratory distress.  Genitourinary: Vaginal discharge (wet prep) found.  Neurological: She is alert and oriented to person, place, and time.  Psychiatric: She has a normal mood and affect. Her behavior is normal.    Data Reviewed PCOS amenorrhea, hair loss. H/O low platelets, has pelvic pain and urinary sx  Assessment    Reassured amenorrhea on OCP is nl, needs urinary assessment     Plan    Ortho cept, [facility]. 6 mo f/u       Kataleia Quaranta 07/06/2014, 2:26 PM

## 2014-07-06 NOTE — Patient Instructions (Addendum)
Sndrome ovrico poliqustico (Polycystic Ovarian Syndrome) El sndrome ovrico poliqustico (PCOS) es un trastorno hormonal comn en mujeres en edad reproductiva. La mayora de las mujeres con este sndrome desarrolla pequeos quistes en sus ovarios. Esto puede ocasionar problemas en la menstruacin y dificultades para quedar embarazada. Tambin puede aumentar el riesgo de aborto espontneo. Si no se trata, el sndrome ovrico poliqustico puede acarrear graves problemas de [place], como diabetes y enfermedades del Training and development officer. CAUSAS La causa de este sndrome no se conoce, pero podra haber un factor gentico. SIGNOS Y SNTOMAS   Perodos menstruales irregulares o ausentes.   Incapacidad para quedar embarazada (infertilidad) debido a la falta de ovulacin   Aumento del crecimiento del vello en el rostro, el trax, el estmago, la espalda, los muslos o los dedos de los pies.   Acn, piel grasa o caspa.   Dolor plvico.   Ganancia de peso u obesidad, por lo general, sobrepeso localizado alrededor de la cintura.   Diabetes tipo 2.   Colesterol elevado.   Hipertensin arterial.   Calvicie femenina o cabello muy fino.   Manchas de piel gruesa marrn oscura o negra en el cuello, brazos, pechos o caderas.   Pequeos excesos de piel suelta (plipos cutneos) en las axilas o en el pecho.   Ronquidos excesivos e interrupcin de la respiracin durante el sueo (apnea del sueo).   Tonalidad grave de Microbiologist.   Diabetes gestacional durante el embarazo.  DIAGNSTICO  No hay una nica prueba para diagnosticar el sndrome ovrico poliqustico.   El profesional que lo asiste:   Optometrist una historia clnica.   Realizar un examen plvico   Realizar una prueba de [place].   Controlar sus niveles de hormonas femeninas y [place].   Medir la glucosa o los niveles de Museum/gallery exhibitions officer.   Realizar otros anlisis de [place].   Si est produciendo demasiadas  hormonas masculinas, el mdico se asegurar de que se debe al sndrome ovrico poliqustico. Durante el examen fsico, el mdico evaluar las zonas de aumento de vello. Permita que se Hydrographic surveyor natural del vello [employer] previos a la visita.   Durante el examen plvico, los ovarios podrn agrandarse o hincharse debido al aumento del nmero de pequeos quistes. Esto puede observarse ms fcilmente mediante la utilizacin de ultrasonido vaginal para examinar los ovarios y las paredes del tero (endometrio) en busca de quistes. Las paredes del tero pueden engrosarse si no tiene un perodo menstrual regular.  TRATAMIENTO  Debido a que no hay cura para el sndrome ovrico poliqustico, debe controlarse para Customer service manager. Los tratamientos se basan en los sntomas. Tambin se basa en su deseo de tener un beb o usar anticonceptivos.  El tratamiento puede incluir:   Hormona progesterona, para iniciar un periodo menstrual.   Pldoras anticonceptivas, para Contractor.   Medicamentos para estimular la ovulacin, si quiere quedar embarazada.   Medicamentos para Optician, dispensing.   Medicamentos para Aeronautical engineer presin arterial.   Medicamentos y dietas para Chief Technology Officer los altos niveles de colesterol y triglicridos en la sangre.  Medicamentos para reducir el crecimiento excesivo de vello.  Ciruga, realizando pequeos cortes en el ovario, para disminuir la produccin de hormona masculina. Esto se realiza a travs de un tubo largo que ilumina (laparoscopio) que se coloca en la pelvis a travs de una pequea incisin en la zona inferior del abdomen.  [place] slo medicamentos de venta libre o recetados,  segn las indicaciones del mdico.  Preste atencin a su alimentacin y a sus niveles de actividad fsica. Esto puede ayudar a reducir los efectos del sndrome ovrico poliqustico.  Controle su  peso.  Consuma alimentos bajos en carbohidratos y altos en contenido de fibra.  Haga ejercicios regularmente. SOLICITE ATENCIN MDICA SI:  Los sntomas no mejoran con los medicamentos.  Aparecen nuevos sntomas. Document Released: 05/30/2008 Document Revised: 12/02/2012 ExitCare Patient Information 2015 ExitCare, [employer]. This information is not intended to replace advice given to you by your health care provider. Make sure you discuss any questions you have with your health care provider.  

## 2014-07-07 LAB — WET PREP, GENITAL
TRICH WET PREP: NONE SEEN
Yeast Wet Prep HPF POC: NONE SEEN

## 2014-07-08 ENCOUNTER — Telehealth: Payer: Self-pay | Admitting: *Deleted

## 2014-07-08 DIAGNOSIS — E282 Polycystic ovarian syndrome: Secondary | ICD-10-CM

## 2014-07-08 DIAGNOSIS — B9689 Other specified bacterial agents as the cause of diseases classified elsewhere: Secondary | ICD-10-CM

## 2014-07-08 DIAGNOSIS — N760 Acute vaginitis: Secondary | ICD-10-CM

## 2014-07-08 LAB — URINE CULTURE: Colony Count: 7000

## 2014-07-08 MED ORDER — METRONIDAZOLE 500 MG PO TABS: 500.0000 mg | ORAL_TABLET | Freq: Two times a day (BID) | ORAL | Status: DC

## 2014-07-08 MED ORDER — DESOGESTREL-ETHINYL ESTRADIOL 0.15-30 MG-MCG PO TABS: 1.0000 | ORAL_TABLET | Freq: Every day | ORAL | Status: DC

## 2014-07-08 NOTE — Telephone Encounter (Signed)
[facility] stating she got her OCPs but was also supposed to have antibiotic at her pharmacy and it isn't there. Per chart review patient has BV and flagyl has not been prescribed. Told patient we will send in her prescription for the antibiotic as well. Patient verbalized understanding and asked where she can buy vitamins. [facility]old [facility] fine. Patient verbalized understanding and had no other questions

## 2014-07-08 NOTE — Telephone Encounter (Addendum)
Pt left message in Spanish. Message reviewed by interpreter Albertina SenegalMarly Adams. Pt stated that she [facility] [address]  Her Rx for OCP from visit on 07/06/14 sent to pharmacy as requested.  Called pt and was unable to leave message to inform her that Rx has been sent as she does not have voice mail.

## 2014-07-11 ENCOUNTER — Telehealth: Payer: Self-pay | Admitting: *Deleted

## 2014-07-11 NOTE — Telephone Encounter (Signed)
Per chart review patient was already notified of need for flagyl and sent to her pharmacy.

## 2014-07-11 NOTE — Telephone Encounter (Signed)
-----   Message from Adam PhenixJames G Arnold, MD sent at 07/08/2014  2:44 PM EDT ----- BV- Flagyl 500 mg BID 7 days

## 2014-07-11 NOTE — Telephone Encounter (Signed)
Called phone number, unable to leave message- heard message no voicemail set up

## 2014-07-15 ENCOUNTER — Telehealth: Payer: Self-pay | Admitting: *Deleted

## 2014-07-15 NOTE — Telephone Encounter (Signed)
Pt left message in Spanish. Interpreter not available in clinic at this time.

## 2014-07-18 NOTE — Telephone Encounter (Signed)
Called patient with Megan Fuller for interpreter, no answer- left message stating we are trying to return your phone call from the other day please call us back at the clinics

## 2014-07-18 NOTE — Telephone Encounter (Signed)
Message patient left was interpreted to say " requesting results of urine- states if something is wrong , please let me know and leave details on voicemail . "

## 2014-07-19 NOTE — Telephone Encounter (Signed)
Pt contacted clinic, spoke to patient with interpreter Raquel Mora.  Pt states she took medication for BV but feels like she has a urinary tract infection.  Results of urine culture given, educated patient if she is continuing to experience urinary frequency she needs to come to the clinic and provide a sample of urine for additional testing.

## 2014-10-24 ENCOUNTER — Ambulatory Visit (INDEPENDENT_AMBULATORY_CARE_PROVIDER_SITE_OTHER): Payer: Self-pay | Admitting: Physician Assistant

## 2014-10-24 VITALS — BP 108/80 | HR 87 | Temp 98.1°F | Resp 20 | Ht 64.0 in | Wt 158.0 lb

## 2014-10-24 DIAGNOSIS — Z598 Other problems related to housing and economic circumstances: Secondary | ICD-10-CM

## 2014-10-24 DIAGNOSIS — Z599 Problem related to housing and economic circumstances, unspecified: Secondary | ICD-10-CM

## 2014-10-24 DIAGNOSIS — B3731 Acute candidiasis of vulva and vagina: Secondary | ICD-10-CM

## 2014-10-24 DIAGNOSIS — R300 Dysuria: Secondary | ICD-10-CM

## 2014-10-24 DIAGNOSIS — B373 Candidiasis of vulva and vagina: Secondary | ICD-10-CM

## 2014-10-24 LAB — POCT URINALYSIS DIPSTICK
Bilirubin, UA: NEGATIVE
Glucose, UA: NEGATIVE
KETONES UA: NEGATIVE
Leukocytes, UA: NEGATIVE
Nitrite, UA: NEGATIVE
PH UA: 6.5
SPEC GRAV UA: 1.01
Urobilinogen, UA: 1

## 2014-10-24 LAB — POCT URINE PREGNANCY: PREG TEST UR: NEGATIVE

## 2014-10-24 LAB — POCT WET PREP WITH KOH
CLUE CELLS WET PREP PER HPF POC: NEGATIVE
KOH Prep POC: POSITIVE
RBC Wet Prep HPF POC: NEGATIVE
TRICHOMONAS UA: NEGATIVE

## 2014-10-24 LAB — POCT UA - MICROSCOPIC ONLY
CASTS, UR, LPF, POC: NEGATIVE
Crystals, Ur, HPF, POC: NEGATIVE
MUCUS UA: NEGATIVE
Yeast, UA: NEGATIVE

## 2014-10-24 MED ORDER — FLUCONAZOLE 150 MG PO TABS: 150.0000 mg | ORAL_TABLET | Freq: Once | ORAL | Status: DC

## 2014-10-24 NOTE — Progress Notes (Signed)
10/24/2014 at 9:22 PM  Megan Fuller / DOB: 1985-10-21 / MRN: [id number]  The patient has PCOS (polycystic ovarian syndrome) on her problem list.  SUBJECTIVE  Megan Fuller is a 29 y.o. well appearing female presenting for the chief complaint of dysuria that has been occuring for "months now." Reports that her back has been bothering her for the last few days and her friend told her she probably has a kidney infection. She denies fever, chills, nausea, and emesis.  She has a history of PCOS.  She is taking birth control.  She is self pay.     She  has a past medical history of PCOS (polycystic ovarian syndrome).    Medications reviewed and updated by myself where necessary, and exist elsewhere in the encounter.   Megan Fuller has No Known Allergies. She  reports that she has never smoked. She has never used smokeless tobacco. She reports that she does not drink alcohol or use illicit drugs. She  reports that she does not engage in sexual activity. The patient  has no past surgical history on file.  Her family history is not on file.  Review of Systems  Constitutional: Negative for fever and chills.  Respiratory: Negative for cough.   Genitourinary: Positive for dysuria and urgency. Negative for frequency, hematuria and flank pain.  Musculoskeletal: Negative for myalgias.  Skin: Negative for itching and rash.  Neurological: Negative for dizziness and headaches.    OBJECTIVE  Her  height is  (1.626 m) and weight is 158 lb (71.668 kg). Her oral temperature is 98.1 F (36.7 C). Her blood pressure is 108/80 and her pulse is 87. Her respiration is 20 and oxygen saturation is 98%.  The patient's body mass index is 27.11 kg/(m^2).  Physical Exam  Constitutional: She is oriented to person, place, and time.  Cardiovascular: Normal rate.   GI: Soft. She exhibits no distension and no mass. There is no tenderness. There is no rebound, no guarding and no CVA tenderness.    Genitourinary: Uterus normal. Rectal exam shows no tenderness. No breast tenderness. Pelvic exam was performed with patient supine. Cervix exhibits no motion tenderness, no discharge and no friability. No bleeding in the vagina. Vaginal discharge (copious white discharge) found.  The vaginal vault contains thick white discharge.    Neurological: She is alert and oriented to person, place, and time. She has normal strength. No cranial nerve deficit or sensory deficit. Gait normal.  Skin: Skin is warm and dry.  Psychiatric: She has a normal mood and affect.    Results for orders placed or performed in visit on 10/24/14 (from the past 24 hour(s))  POCT urine pregnancy     Status: None   Collection Time: 10/24/14  8:21 PM  Result Value Ref Range   Preg Test, Ur Negative Negative  POCT urinalysis dipstick     Status: None   Collection Time: 10/24/14  8:21 PM  Result Value Ref Range   Color, UA yellow    Clarity, UA clear    Glucose, UA negative    Bilirubin, UA negative    Ketones, UA negative    Spec Grav, UA 1.010    Blood, UA large    pH, UA 6.5    Protein, UA 1+    Urobilinogen, UA 1.0    Nitrite, UA negative    Leukocytes, UA Negative Negative  POCT UA - Microscopic Only     Status: None   Collection Time:  10/24/14  8:21 PM  Result Value Ref Range   WBC, Ur, HPF, POC 0-2    RBC, urine, microscopic 20+    Bacteria, U Microscopic trace    Mucus, UA negative    Epithelial cells, urine per micros 0-1    Crystals, Ur, HPF, POC negative    Casts, Ur, LPF, POC negative    Yeast, UA negative   POCT Wet Prep with KOH     Status: Abnormal   Collection Time: 10/24/14  9:11 PM  Result Value Ref Range   Trichomonas, UA Negative    Clue Cells Wet Prep HPF POC Negative    Epithelial Wet Prep HPF POC Many Few, Moderate, Many   Yeast Wet Prep HPF POC Few    Bacteria Wet Prep HPF POC Many (A) None, Few   RBC Wet Prep HPF POC Negative    WBC Wet Prep HPF POC 10-20    KOH Prep POC  Positive     ASSESSMENT & PLAN  Megan Fuller was seen today for back pain.  Diagnoses and all orders for this visit:  Dysuria: Her urine appears largely normal aside from microscopic hematuria.  She has no flank pain on exam. She has a history of PCOS.  She is advised to call or come back to the office in two days if she is not improved. Culture not done due to low suspicion given lack of leukocytes and nitrites.     -     POCT urine pregnancy -     POCT urinalysis dipstick -     POCT UA - Microscopic Only -     POCT Wet Prep with KOH -     fluconazole (DIFLUCAN) 150 MG tablet; Take 1 tablet (150 mg total) by mouth once. Repeat if needed  Yeast vaginitis: Patient electing for no further work up due to financial constraints.  -     fluconazole (DIFLUCAN) 150 MG tablet; Take 1 tablet (150 mg total) by mouth once. Repeat if needed    The patient was advised to call or come back to clinic if she does not see an improvement in symptoms, or worsens with the above plan.   Deliah Boston, MHS, PA-C [facility] 10/24/2014 9:22 PM

## 2014-10-26 NOTE — Addendum Note (Signed)
Addended by: Ofilia Neas on: 10/26/2014 05:21 PM   Modules accepted: Kipp Brood

## 2015-04-20 ENCOUNTER — Telehealth: Payer: Self-pay

## 2015-04-20 NOTE — Telephone Encounter (Signed)
Patient walked in to check on her financial application called billing spoke with Marcelino Duster E and she said her application just fell through the cracks will have someone process it today and will get a letter within 2 weeks for her. I also asked her if I could speak to someone about the application process since this happens more often than it should. She gave me her supervisors name Edson Snowball Nifong told me to e-mail her about it because their is lots of confusion when it comes to these applications.

## 2015-05-01 NOTE — Telephone Encounter (Signed)
ERROR

## 2018-01-20 ENCOUNTER — Encounter (HOSPITAL_COMMUNITY): Payer: Self-pay | Admitting: Emergency Medicine

## 2018-01-20 ENCOUNTER — Emergency Department (HOSPITAL_COMMUNITY)
Admission: EM | Admit: 2018-01-20 | Discharge: 2018-01-20 | Disposition: A | Discharge status: Home / Self Care | Payer: Self-pay | Attending: Emergency Medicine | Admitting: Emergency Medicine

## 2018-01-20 ENCOUNTER — Emergency Department (HOSPITAL_COMMUNITY): Payer: Self-pay

## 2018-01-20 DIAGNOSIS — G8929 Other chronic pain: Secondary | ICD-10-CM | POA: Insufficient documentation

## 2018-01-20 DIAGNOSIS — Z79899 Other long term (current) drug therapy: Secondary | ICD-10-CM | POA: Insufficient documentation

## 2018-01-20 DIAGNOSIS — M79645 Pain in left finger(s): Secondary | ICD-10-CM | POA: Insufficient documentation

## 2018-01-20 MED ORDER — ACETAMINOPHEN 500 MG PO TABS: 500.0000 mg | ORAL_TABLET | Freq: Four times a day (QID) | ORAL | 0 refills | Status: DC | PRN

## 2018-01-20 NOTE — ED Notes (Signed)
Patient verbalizes understanding of discharge instructions. Opportunity for questioning and answers were provided. Armband removed by staff, pt discharged from ED ambulatory to home.  

## 2018-01-20 NOTE — ED Triage Notes (Signed)
Pt reports L hand pain x5 weeks, CMS intact but reports continued pain.

## 2018-01-20 NOTE — ED Provider Notes (Signed)
[facility] EMERGENCY DEPARTMENT Provider Note   CSN: [id number] Arrival date & time: 01/20/18  1755     History   Chief Complaint Chief Complaint  Patient presents with  . Hand Pain    HPI Megan Fuller is a 32 y.o. female medical history as below presents emergency department today for left thumb pain.  Patient reports that she had atraumatic thumb pain over the last several weeks.  She notes that she uses her hands frequently at work and over the last several weeks she is noticed pain in the base of her thumb and extends into her wrist.  She notes that this is painful when she tries to grasp items at times feels weakness in this area.  She denies any numbness or tingling.  She denies any trauma.  No fevers.  Patient denies any other complaints.  HPI  Past Medical History:  Diagnosis Date  . PCOS (polycystic ovarian syndrome)     Patient Active Problem List   Diagnosis Date Noted  . PCOS (polycystic ovarian syndrome) 02/10/2014    History reviewed. No pertinent surgical history.   OB History   None      Home Medications    Prior to Admission medications   Medication Sig Start Date End Date Taking? Authorizing Provider  desogestrel-ethinyl estradiol (APRI,EMOQUETTE,SOLIA) 0.15-30 MG-MCG tablet Take 1 tablet by mouth daily. 07/08/14   Adam Phenix, MD  drospirenone-ethinyl estradiol Pierre Bali) 3-0.02 MG tablet Take 1 tablet by mouth daily. Patient not taking: Reported on 07/06/2014 05/18/14   Adam Phenix, MD  fluconazole (DIFLUCAN) 150 MG tablet Take 1 tablet (150 mg total) by mouth once. Repeat if needed 10/24/14   Ofilia Neas, PA-C  medroxyPROGESTERone (PROVERA) 10 MG tablet Take 1 tablet (10 mg total) by mouth daily. Use for ten days Patient not taking: Reported on 05/16/2014 02/10/14   Adam Phenix, MD  metroNIDAZOLE (FLAGYL) 500 MG tablet Take 1 tablet (500 mg total) by mouth 2 (two) times daily. Patient not taking:  Reported on 10/24/2014 07/08/14   Adam Phenix, MD  Norgestimate-Ethinyl Estradiol Triphasic 0.18/0.215/0.25 MG-25 MCG tab Take 1 tablet by mouth daily. Patient not taking: Reported on 10/24/2014 03/07/14   Adam Phenix, MD    Family History History reviewed. No pertinent family history.  Social History Social History   Tobacco Use  . Smoking status: Never Smoker  . Smokeless tobacco: Never Used  Substance Use Topics  . Alcohol use: No    Alcohol/week: 0.0 standard drinks  . Drug use: No     Allergies   Patient has no known allergies.   Review of Systems Review of Systems  Constitutional: Negative for fever.  Musculoskeletal: Positive for arthralgias.  Skin: Negative for color change and wound.  Neurological: Negative for weakness and numbness.     Physical Exam Updated Vital Signs BP 124/71   Pulse 72   Temp 99.4 F (37.4 C) (Oral)   Resp 16   Ht 5\' 4"  (1.626 m)   Wt 76.2 kg   LMP  (LMP Unknown)   SpO2 100%   BMI 28.84 kg/m   Physical Exam  Constitutional: She appears well-developed and well-nourished.  HENT:  Head: Normocephalic and atraumatic.  Right Ear: External ear normal.  Left Ear: External ear normal.  Eyes: Conjunctivae are normal. Right eye exhibits no discharge. Left eye exhibits no discharge. No scleral icterus.  Cardiovascular:  Pulses:      Radial pulses are 2+  on the right side, and 2+ on the left side.  Pulmonary/Chest: Effort normal. No respiratory distress.  Musculoskeletal:       Left wrist: Normal.  Left hand: No gross deformities, skin intact. Fingers appear normal. No TTP over flexor sheath. TTP over base of the left thumb. No snuffbox TTP. Finger adduction/abduction intact with 5/5 strength.  Thumb opposition intact. Full active and resisted ROM to flexion/extension at wrist, MCP, PIP and DIP of all fingers. Patient does not pain with thumb rom. FDS/FDP intact. Radial artery 2+ with <2sec cap refill. SILT in M/U/R distributions.   Positive Finkelstein's test.  Neurological: She is alert. She has normal strength. No sensory deficit.  Skin: Skin is warm and dry. Capillary refill takes less than 2 seconds. No erythema. No pallor.  Psychiatric: She has a normal mood and affect.  Nursing note and vitals reviewed.    ED Treatments / Results  Labs (all labs ordered are listed, but only abnormal results are displayed) Labs Reviewed - No data to display  EKG None  Radiology Dg Hand Complete Left  Result Date: 01/20/2018 CLINICAL DATA:  Left hand pain for 5 weeks after soccer injury. EXAM: LEFT HAND - COMPLETE 3+ VIEW COMPARISON:  None. FINDINGS: There is no evidence of fracture or dislocation. There is no evidence of arthropathy or other focal bone abnormality. Soft tissues are unremarkable. IMPRESSION: Negative. Electronically Signed   By: Lupita RaiderJames  Green Jr, M.D.   On: 01/20/2018 19:25    Procedures Procedures (including critical care time)  Medications Ordered in ED Medications - No data to display   Initial Impression / Assessment and Plan / ED Course  I have reviewed the triage vital signs and the nursing notes.  Pertinent labs & imaging results that were available during my care of the patient were reviewed by me and considered in my medical decision making (see chart for details).     32 y.o. female with atraumatic left hand pain. No snuffbox ttp. She notes that she frequently uses her hands at work.  Patient is without fever and has normal range of motion.  Do not suspect septic joint.  X-rays are unremarkable.  Patient does have positive Finkelstein's test.  I suspect de Quervain's tenosynovitis. I do not suspect infectious cause.  She is neurovascular intact.  There is no open wounds.  Will treat with thumb spica and conservative therapy.  Recommended follow-up with hand specialist.  Return precautions discussed.  Patient appears safe for discharge.  Final Clinical Impressions(s) / ED Diagnoses   Final  diagnoses:  Chronic pain of left thumb    ED Discharge Orders         Ordered    acetaminophen (TYLENOL) 500 MG tablet  Every 6 hours PRN     01/20/18 2009           Princella PellegriniMaczis, Kaydon Husby M, PA-C 01/20/18 2011    Raeford RazorKohut, Stephen, MD 01/21/18 0010

## 2018-01-20 NOTE — ED Notes (Signed)
Ortho tech called about thumb spica for pt

## 2018-01-20 NOTE — Discharge Instructions (Addendum)
Please follow attached handout.  Wear the brace for all activities.  Follow up with the hand specalist in 1 week. Call tomorrow to make an appointment.  If you develop worsening or new concerning symptoms you can return to the emergency department for re-evaluation.

## 2018-08-21 ENCOUNTER — Emergency Department (HOSPITAL_COMMUNITY)
Admission: EM | Admit: 2018-08-21 | Discharge: 2018-08-21 | Disposition: A | Discharge status: Home / Self Care | Payer: Self-pay | Attending: Emergency Medicine | Admitting: Emergency Medicine

## 2018-08-21 ENCOUNTER — Other Ambulatory Visit: Payer: Self-pay

## 2018-08-21 ENCOUNTER — Encounter (HOSPITAL_COMMUNITY): Payer: Self-pay | Admitting: Emergency Medicine

## 2018-08-21 DIAGNOSIS — N12 Tubulo-interstitial nephritis, not specified as acute or chronic: Secondary | ICD-10-CM | POA: Insufficient documentation

## 2018-08-21 DIAGNOSIS — Z3202 Encounter for pregnancy test, result negative: Secondary | ICD-10-CM | POA: Insufficient documentation

## 2018-08-21 DIAGNOSIS — Z79899 Other long term (current) drug therapy: Secondary | ICD-10-CM | POA: Insufficient documentation

## 2018-08-21 LAB — URINALYSIS, COMPLETE (UACMP) WITH MICROSCOPIC
Bilirubin Urine: NEGATIVE
Glucose, UA: NEGATIVE mg/dL
Ketones, ur: 5 mg/dL — AB
Nitrite: POSITIVE — AB
Protein, ur: 100 mg/dL — AB
Specific Gravity, Urine: 1.016 (ref 1.005–1.030)
WBC, UA: >50 WBC/hpf — ABNORMAL HIGH (ref 0–5)
pH: 6 (ref 5.0–8.0)

## 2018-08-21 LAB — PREGNANCY, URINE: Preg Test, Ur: NEGATIVE

## 2018-08-21 LAB — BASIC METABOLIC PANEL
Anion gap: 9 (ref 5–15)
BUN: 9 mg/dL (ref 6–20)
CO2: 21 mmol/L — ABNORMAL LOW (ref 22–32)
Calcium: 8.6 mg/dL — ABNORMAL LOW (ref 8.9–10.3)
Chloride: 105 mmol/L (ref 98–111)
Creatinine, Ser: 1.07 mg/dL — ABNORMAL HIGH (ref 0.44–1.00)
GFR calc Af Amer: >60 mL/min (ref >60)
GFR calc non Af Amer: >60 mL/min (ref >60)
Glucose, Bld: 135 mg/dL — ABNORMAL HIGH (ref 70–99)
Potassium: 3.9 mmol/L (ref 3.5–5.1)
Sodium: 135 mmol/L (ref 135–145)

## 2018-08-21 LAB — CBC WITH DIFFERENTIAL/PLATELET
Abs Immature Granulocytes: 0.12 10*3/uL — ABNORMAL HIGH (ref 0.00–0.07)
Basophils Absolute: 0 10*3/uL (ref 0.0–0.1)
Basophils Relative: 0 %
Eosinophils Absolute: 0.3 10*3/uL (ref 0.0–0.5)
Eosinophils Relative: 3 %
HCT: 40.7 % (ref 36.0–46.0)
Hemoglobin: 13.7 g/dL (ref 12.0–15.0)
Immature Granulocytes: 1 %
Lymphocytes Relative: 2 %
Lymphs Abs: 0.2 10*3/uL — ABNORMAL LOW (ref 0.7–4.0)
MCH: 33.4 pg (ref 26.0–34.0)
MCHC: 33.7 g/dL (ref 30.0–36.0)
MCV: 99.3 fL (ref 80.0–100.0)
Monocytes Absolute: 0.5 10*3/uL (ref 0.1–1.0)
Monocytes Relative: 5 %
Neutro Abs: 8.9 10*3/uL — ABNORMAL HIGH (ref 1.7–7.7)
Neutrophils Relative %: 89 %
Platelets: DECREASED 10*3/uL (ref 150–400)
RBC: 4.1 MIL/uL (ref 3.87–5.11)
RDW: 12.7 % (ref 11.5–15.5)
WBC: 10 10*3/uL (ref 4.0–10.5)
nRBC: 0 % (ref 0.0–0.2)

## 2018-08-21 MED ORDER — SODIUM CHLORIDE 0.9 % IV SOLN
1.0000 g | Freq: Once | INTRAVENOUS | Status: AC
  Administered 2018-08-21: 1 g via INTRAVENOUS
  Filled 2018-08-21: qty 10

## 2018-08-21 MED ORDER — KETOROLAC TROMETHAMINE 30 MG/ML IJ SOLN
15.0000 mg | Freq: Once | INTRAMUSCULAR | Status: AC
  Administered 2018-08-21: 15 mg via INTRAVENOUS
  Filled 2018-08-21: qty 1

## 2018-08-21 MED ORDER — ACETAMINOPHEN 325 MG PO TABS
650.0000 mg | ORAL_TABLET | Freq: Once | ORAL | Status: AC
  Administered 2018-08-21: 650 mg via ORAL
  Filled 2018-08-21: qty 2

## 2018-08-21 MED ORDER — HYDROCODONE-ACETAMINOPHEN 5-325 MG PO TABS: 1.0000 | ORAL_TABLET | Freq: Four times a day (QID) | ORAL | 0 refills | Status: DC | PRN

## 2018-08-21 MED ORDER — SODIUM CHLORIDE 0.9 % IV BOLUS
500.0000 mL | Freq: Once | INTRAVENOUS | Status: AC
  Administered 2018-08-21: 500 mL via INTRAVENOUS

## 2018-08-21 MED ORDER — ONDANSETRON HCL 4 MG PO TABS: 4.0000 mg | ORAL_TABLET | Freq: Four times a day (QID) | ORAL | 0 refills | Status: DC | PRN

## 2018-08-21 MED ORDER — CEPHALEXIN 500 MG PO CAPS: 500.0000 mg | ORAL_CAPSULE | Freq: Four times a day (QID) | ORAL | 0 refills | Status: DC

## 2018-08-21 MED ORDER — SODIUM CHLORIDE 0.9 % IV BOLUS
500.0000 mL | Freq: Once | INTRAVENOUS | Status: AC
  Administered 2018-08-21: 15:00:00 500 mL via INTRAVENOUS

## 2018-08-21 MED ORDER — IBUPROFEN 600 MG PO TABS: 600.0000 mg | ORAL_TABLET | Freq: Four times a day (QID) | ORAL | 0 refills | Status: DC | PRN

## 2018-08-21 NOTE — ED Triage Notes (Signed)
Pt here for eval of bilateral flank pain with burning with urination for 2 weeks.

## 2018-08-21 NOTE — ED Provider Notes (Signed)
[facility] Provider [facility]   CSN: [id number] Arrival date & time: 08/21/18  1340    History   Chief Complaint Chief Complaint  Patient presents with  . Flank Pain  . Dysuria    HPI Megan Fuller is a 33 y.o. female with history of PCOS who presents with a 2-week history of dysuria and bilateral flank pain, worse on the left.  Patient also found to have fever in the ED today.  Patient has had some left lower quadrant pain radiating back to her back.  It is worse with movement.  She denies any abnormal vaginal discharge or bleeding.  She has no concern for STD exposure.  She has history of recurrent UTI.  Patient has been taking ibuprofen at home without significant relief.  She denies any chest pain, shortness of breath, abdominal pain, nausea, vomiting.  She has no history of kidney stones.     HPI  Past Medical History:  Diagnosis Date  . PCOS (polycystic ovarian syndrome)     Patient Active Problem List   Diagnosis Date Noted  . PCOS (polycystic ovarian syndrome) 02/10/2014    No past surgical history on file.   OB History   No obstetric history on file.      Home Medications    Prior to Admission medications   Medication Sig Start Date End Date Taking? Authorizing Provider  acetaminophen (TYLENOL) 500 MG tablet Take 1 tablet (500 mg total) by mouth every 6 (six) hours as needed. 01/20/18   Maczis, Elmer SowMichael M, PA-C  cephALEXin (KEFLEX) 500 MG capsule Take 1 capsule (500 mg total) by mouth 4 (four) times daily. 08/21/18   Emi HolesLaw, Katie Moch M, PA-C  desogestrel-ethinyl estradiol (APRI,EMOQUETTE,SOLIA) 0.15-30 MG-MCG tablet Take 1 tablet by mouth daily. 07/08/14   Adam PhenixArnold, James G, MD  drospirenone-ethinyl estradiol Pierre Bali(YAZ,GIANVI,LORYNA) 3-0.02 MG tablet Take 1 tablet by mouth daily. Patient not taking: Reported on 07/06/2014 05/18/14   Adam PhenixArnold, James G, MD  fluconazole (DIFLUCAN) 150 MG tablet Take 1 tablet (150 mg total) by mouth once.  Repeat if needed 10/24/14   Ofilia Neaslark, Michael L, PA-C  HYDROcodone-acetaminophen (NORCO/VICODIN) 5-325 MG tablet Take 1 tablet by mouth every 6 (six) hours as needed for severe pain. 08/21/18   Galina Haddox, Waylan BogaAlexandra M, PA-C  ibuprofen (ADVIL) 600 MG tablet Take 1 tablet (600 mg total) by mouth every 6 (six) hours as needed. 08/21/18   Jupiter Kabir, Waylan BogaAlexandra M, PA-C  medroxyPROGESTERone (PROVERA) 10 MG tablet Take 1 tablet (10 mg total) by mouth daily. Use for ten days Patient not taking: Reported on 05/16/2014 02/10/14   Adam PhenixArnold, James G, MD  metroNIDAZOLE (FLAGYL) 500 MG tablet Take 1 tablet (500 mg total) by mouth 2 (two) times daily. Patient not taking: Reported on 10/24/2014 07/08/14   Adam PhenixArnold, James G, MD  Norgestimate-Ethinyl Estradiol Triphasic 0.18/0.215/0.25 MG-25 MCG tab Take 1 tablet by mouth daily. Patient not taking: Reported on 10/24/2014 03/07/14   Adam PhenixArnold, James G, MD  ondansetron [facility](ZOFRAN) 4 MG tablet Take 1 tablet (4 mg total) by mouth every 6 (six) hours as needed for nausea or vomiting. 08/21/18   Emi HolesLaw, Jacen Carlini M, PA-C    Family History No family history on file.  Social History Social History   Tobacco Use  . Smoking status: Never Smoker  . Smokeless tobacco: Never Used  Substance Use Topics  . Alcohol use: No    Alcohol/week: 0.0 standard drinks  . Drug use: No     Allergies  Patient has no known allergies.   Review of Systems Review of Systems  Constitutional: Positive for fever. Negative for chills.  HENT: Negative for facial swelling and sore throat.   Respiratory: Negative for shortness of breath.   Cardiovascular: Negative for chest pain.  Gastrointestinal: Positive for abdominal pain (LLQ) and nausea. Negative for diarrhea and vomiting.  Genitourinary: Positive for dysuria and flank pain.  Musculoskeletal: Positive for back pain.  Skin: Negative for rash and wound.  Neurological: Negative for headaches.  Psychiatric/Behavioral: The patient is not nervous/anxious.       Physical Exam Updated Vital Signs BP 105/68 (BP Location: Right Arm)   Pulse 89   Temp 98.3 F (36.8 C) (Oral)   Resp 18   SpO2 100%   Physical Exam Vitals signs and nursing note reviewed.  Constitutional:      General: She is not in acute distress.    Appearance: She is well-developed. She is not diaphoretic.  HENT:     Head: Normocephalic and atraumatic.     Mouth/Throat:     Pharynx: No oropharyngeal exudate.  Eyes:     General: No scleral icterus.       Right eye: No discharge.        Left eye: No discharge.     Conjunctiva/sclera: Conjunctivae normal.     Pupils: Pupils are equal, round, and reactive to light.  Neck:     Musculoskeletal: Normal range of motion and neck supple.     Thyroid: No thyromegaly.  Cardiovascular:     Rate and Rhythm: Normal rate and regular rhythm.     Heart sounds: Normal heart sounds. No murmur. No friction rub. No gallop.   Pulmonary:     Effort: Pulmonary effort is normal. No respiratory distress.     Breath sounds: Normal breath sounds. No stridor. No wheezing or rales.  Abdominal:     General: Bowel sounds are normal. There is no distension.     Palpations: Abdomen is soft.     Tenderness: There is abdominal tenderness in the left lower quadrant. There is left CVA tenderness. There is no right CVA tenderness, guarding or rebound.  Lymphadenopathy:     Cervical: No cervical adenopathy.  Skin:    General: Skin is warm and dry.     Coloration: Skin is not pale.     Findings: No rash.  Neurological:     Mental Status: She is alert.     Coordination: Coordination normal.      ED Treatments / Results  Labs (all labs ordered are listed, but only abnormal results are displayed) Labs Reviewed  URINALYSIS, COMPLETE (UACMP) WITH MICROSCOPIC - Abnormal; Notable for the following components:      Result Value   Color, Urine AMBER (*)    APPearance CLOUDY (*)    Hgb urine dipstick MODERATE (*)    Ketones, ur 5 (*)    Protein, ur 100  (*)    Nitrite POSITIVE (*)    Leukocytes,Ua LARGE (*)    WBC, UA >50 (*)    Bacteria, UA MANY (*)    All other components within normal limits  BASIC METABOLIC PANEL - Abnormal; Notable for the following components:   CO2 21 (*)    Glucose, Bld 135 (*)    Creatinine, Ser 1.07 (*)    Calcium 8.6 (*)    All other components within normal limits  CBC WITH DIFFERENTIAL/PLATELET - Abnormal; Notable for the following components:   Neutro Abs 8.9 (*)  Lymphs Abs 0.2 (*)    Abs Immature Granulocytes 0.12 (*)    All other components within normal limits  URINE CULTURE  PREGNANCY, URINE    EKG None  Radiology No results found.  Procedures Procedures (including critical care time)  Medications Ordered in ED Medications  acetaminophen (TYLENOL) tablet 650 mg (650 mg Oral Given 08/21/18 1445)  sodium chloride 0.9 % bolus 500 mL (0 mLs Intravenous Stopped 08/21/18 1550)  cefTRIAXone (ROCEPHIN) 1 g in sodium chloride 0.9 % 100 mL IVPB (0 g Intravenous Stopped 08/21/18 1519)  sodium chloride 0.9 % bolus 500 mL (500 mLs Intravenous New Bag/Given 08/21/18 1550)  ketorolac (TORADOL) 30 MG/ML injection 15 mg (15 mg Intravenous Given 08/21/18 1549)     Initial Impression / Assessment and Plan / ED Course  I have reviewed the triage vital signs and the nursing notes.  Pertinent labs & imaging results that were available during my care of the patient were reviewed by me and considered in my medical decision making (see chart for details).        Patient presenting with suspected pyelonephritis.  UA shows moderate hematuria, 5 ketones, 100 protein, positive nitrites, large leukocytes, greater than 50 WBCs, many bacteria.  Urine culture sent.  Patient does show a mild elevation in creatinine from around point .77-.66 to 1.07 today.  Patient given 1500 mL fluid bolus, IV Rocephin, IV Toradol and is feeling much better.  Patient tolerating oral fluids.  Patient is very comfortable appearing and  has no history of kidney stones.  Low suspicion of kidney stone and do not feel emergent imaging is indicated today.  Will discharge home with Keflex, Zofran, ibuprofen, Norco.  Strict return precautions given.  Patient understands and agrees with plan.  Patient vital stable throughout ED course and discharged in satisfactory condition.  Final Clinical Impressions(s) / ED Diagnoses   Final diagnoses:  Pyelonephritis    ED Discharge Orders         Ordered    cephALEXin (KEFLEX) 500 MG capsule  4 times daily     08/21/18 1558    ondansetron (ZOFRAN) 4 MG tablet  Every 6 hours PRN     08/21/18 1558    HYDROcodone-acetaminophen (NORCO/VICODIN) 5-325 MG tablet  Every 6 hours PRN     08/21/18 1558    ibuprofen (ADVIL) 600 MG tablet  Every 6 hours PRN     08/21/18 1558           Oluwatobi Visser, [place] CenterAlexandra M, PA-C 08/21/18 1613    Bethann BerkshireZammit, Joseph, MD 08/25/18 1256

## 2018-08-21 NOTE — Discharge Instructions (Addendum)
Take Keflex with food until completed.  Make sure to finish all of this medication.  Take Zofran every 6 hours as needed for nausea or vomiting.  Take ibuprofen every 6 hours as needed for mild to moderate pain.  For severe pain, take Vicodin every 6 hours.  Do not drive or operate machinery while taking this medication.  It is important that you drink plenty of water and stay well-hydrated.  Please return to the emergency department if you develop any new or worsening symptoms including severe, worsening pain, intractable vomiting, inability to keep your medications down, or any other new or concerning symptoms.  Do not drink alcohol, drive, operate machinery or participate in any other potentially dangerous activities while taking opiate pain medication as it may make you sleepy. Do not take this medication with any other sedating medications, either prescription or over-the-counter. If you were prescribed Percocet or Vicodin, do not take these with acetaminophen (Tylenol) as it is already contained within these medications and overdose of Tylenol is dangerous.   This medication is an opiate (or narcotic) pain medication and can be habit forming.  Use it as little as possible to achieve adequate pain control.  Do not use or use it with extreme caution if you have a history of opiate abuse or dependence. This medication is intended for your use only - do not give any to anyone else and keep it in a secure place where nobody else, especially children, have access to it. It will also cause or worsen constipation, so you may want to consider taking an over-the-counter stool softener while you are taking this medication.

## 2018-08-23 ENCOUNTER — Emergency Department (HOSPITAL_COMMUNITY)
Admission: EM | Admit: 2018-08-23 | Discharge: 2018-08-24 | Disposition: A | Discharge status: Home / Self Care | Payer: Self-pay | Attending: Emergency Medicine | Admitting: Emergency Medicine

## 2018-08-23 ENCOUNTER — Other Ambulatory Visit: Payer: Self-pay

## 2018-08-23 ENCOUNTER — Encounter (HOSPITAL_COMMUNITY): Payer: Self-pay

## 2018-08-23 DIAGNOSIS — Z79899 Other long term (current) drug therapy: Secondary | ICD-10-CM | POA: Insufficient documentation

## 2018-08-23 DIAGNOSIS — G44209 Tension-type headache, unspecified, not intractable: Secondary | ICD-10-CM | POA: Insufficient documentation

## 2018-08-23 LAB — URINE CULTURE: Culture: >=100000 — AB

## 2018-08-23 MED ORDER — KETOROLAC TROMETHAMINE 60 MG/2ML IM SOLN
30.0000 mg | Freq: Once | INTRAMUSCULAR | Status: AC
  Administered 2018-08-24: 30 mg via INTRAMUSCULAR
  Filled 2018-08-23: qty 2

## 2018-08-23 NOTE — ED Provider Notes (Signed)
[facility] Provider Note  CSN: [id number] Arrival date & time: 08/23/18 2200  Chief Complaint(s) Headache  HPI Megan Fuller is a 33 y.o. female seen 2 days ago for pyelonephritis presents today for headache. Headache started 2-3 days ago and is generalized aching and throbbing in nature, moderate to severe. Improved intermittently with Tylenol and Motrin. No exacerbating factors. Back pain and dysuria has been improving. No photo phonophobia.  No nuchal rigidity.  No focal deficits.  No visual disturbance.  Endorses nausea without emesis.  Reports she has been trying to hydrate.  She did report taking her Vicodin and Norco for the pain which makes her sleepy.  No other physical complaints.  HPI  Past Medical History Past Medical History:  Diagnosis Date  . PCOS (polycystic ovarian syndrome)    Patient Active Problem List   Diagnosis Date Noted  . PCOS (polycystic ovarian syndrome) 02/10/2014   Home Medication(s) Prior to Admission medications   Medication Sig Start Date End Date Taking? Authorizing Provider  acetaminophen (TYLENOL) 500 MG tablet Take 1 tablet (500 mg total) by mouth every 6 (six) hours as needed. 01/20/18   Maczis, Elmer SowMichael M, PA-C  cephALEXin (KEFLEX) 500 MG capsule Take 1 capsule (500 mg total) by mouth 4 (four) times daily. 08/21/18   Emi HolesLaw, Alexandra M, PA-C  desogestrel-ethinyl estradiol (APRI,EMOQUETTE,SOLIA) 0.15-30 MG-MCG tablet Take 1 tablet by mouth daily. 07/08/14   Adam PhenixArnold, James G, MD  drospirenone-ethinyl estradiol Pierre Bali(YAZ,GIANVI,LORYNA) 3-0.02 MG tablet Take 1 tablet by mouth daily. Patient not taking: Reported on 07/06/2014 05/18/14   Adam PhenixArnold, James G, MD  fluconazole (DIFLUCAN) 150 MG tablet Take 1 tablet (150 mg total) by mouth once. Repeat if needed 10/24/14   Ofilia Neaslark, Michael L, PA-C  HYDROcodone-acetaminophen (NORCO/VICODIN) 5-325 MG tablet Take 1 tablet by mouth every 6 (six) hours as needed for severe pain. 08/21/18    Law, Waylan BogaAlexandra M, PA-C  ibuprofen (ADVIL) 600 MG tablet Take 1 tablet (600 mg total) by mouth every 6 (six) hours as needed. 08/21/18   Law, Waylan BogaAlexandra M, PA-C  medroxyPROGESTERone (PROVERA) 10 MG tablet Take 1 tablet (10 mg total) by mouth daily. Use for ten days Patient not taking: Reported on 05/16/2014 02/10/14   Adam PhenixArnold, James G, MD  metroNIDAZOLE (FLAGYL) 500 MG tablet Take 1 tablet (500 mg total) by mouth 2 (two) times daily. Patient not taking: Reported on 10/24/2014 07/08/14   Adam PhenixArnold, James G, MD  Norgestimate-Ethinyl Estradiol Triphasic 0.18/0.215/0.25 MG-25 MCG tab Take 1 tablet by mouth daily. Patient not taking: Reported on 10/24/2014 03/07/14   Adam PhenixArnold, James G, MD  ondansetron [facility](ZOFRAN) 4 MG tablet Take 1 tablet (4 mg total) by mouth every 6 (six) hours as needed for nausea or vomiting. 08/21/18   Emi HolesLaw, Alexandra M, PA-C  Past Surgical History History reviewed. No pertinent surgical history. Family History No family history on file.  Social History Social History   Tobacco Use  . Smoking status: Never Smoker  . Smokeless tobacco: Never Used  Substance Use Topics  . Alcohol use: No    Alcohol/week: 0.0 standard drinks  . Drug use: No   Allergies Patient has no known allergies.  Review of Systems Review of Systems All other systems are reviewed and are negative for acute change except as noted in the HPI  Physical Exam Vital Signs  I have reviewed the triage vital signs BP 122/78   Pulse 81   Temp 99.2 F (37.3 C) (Oral)   Resp 16   SpO2 100%   Physical Exam Vitals signs reviewed.  Constitutional:      General: She is not in acute distress.    Appearance: She is well-developed. She is not diaphoretic.  HENT:     Head: Normocephalic and atraumatic.     Right Ear: External ear normal.     Left Ear: External ear normal.     Nose: Nose  normal.  Eyes:     General: No scleral icterus.    Conjunctiva/sclera: Conjunctivae normal.  Neck:     Musculoskeletal: Normal range of motion. Normal range of motion. No neck rigidity or spinous process tenderness.     Trachea: Phonation normal.     Meningeal: Brudzinski's sign and Kernig's sign absent.  Cardiovascular:     Rate and Rhythm: Normal rate and regular rhythm.  Pulmonary:     Effort: Pulmonary effort is normal. No respiratory distress.     Breath sounds: No stridor.  Abdominal:     General: There is no distension.  Musculoskeletal: Normal range of motion.     Cervical back: She exhibits tenderness. She exhibits no bony tenderness.       Back:  Neurological:     Mental Status: She is alert and oriented to person, place, and time.     Comments: Mental Status:  Alert and oriented to person, place, and time.  Attention and concentration normal.  Speech clear.  Recent memory is intact  Cranial Nerves:  II Visual Fields: Intact to confrontation. Visual fields intact. III, IV, VI: Pupils equal and reactive to light and near. Full eye movement without nystagmus  V Facial Sensation: Normal. No weakness of masticatory muscles  VII: No facial weakness or asymmetry  VIII Auditory Acuity: Grossly normal  IX/X: The uvula is midline; the palate elevates symmetrically  XI: Normal sternocleidomastoid and trapezius strength  XII: The tongue is midline. No atrophy or fasciculations.   Motor System: Muscle Strength: 5/5 and symmetric in the upper and lower extremities. No pronation or drift.  Muscle Tone: Tone and muscle bulk are normal in the upper and lower extremities.   Reflexes: DTRs: 1+ and symmetrical in all four extremities. No Clonus Coordination: Intact finger-to-nose, heel-to-shin. No tremor.  Sensation: Intact to light touch, and pinprick. Negative Romberg test.  Gait: Routine gait normal.   Psychiatric:        Behavior: Behavior normal.     ED Results and  Treatments Labs (all labs ordered are listed, but only abnormal results are displayed) Labs Reviewed - No data to display  EKG  EKG Interpretation  Date/Time:    Ventricular Rate:    PR Interval:    QRS Duration:   QT Interval:    QTC Calculation:   R Axis:     Text Interpretation:        Radiology No results found.  Pertinent labs & imaging results that were available during my care of the patient were reviewed by me and considered in my medical decision making (see chart for details).  Medications Ordered in ED Medications  ketorolac (TORADOL) injection 30 mg (30 mg Intramuscular Given 08/24/18 0010)                                                                                                                                    Procedures Procedures  (including critical care time)  Medical Decision Making / ED Course I have reviewed the nursing notes for this encounter and the patient's prior records (if available in EHR or on provided paperwork).  Non focal neuro exam. No recent head trauma. No meningeal signs. Doubt meningitis. Doubt intracranial bleed. Doubt IIH. No indication for imaging.  Feel this is mostly related to muscular tension headache. Provided with Toradol.  The patient appears reasonably screened and/or stabilized for discharge and I doubt any other medical condition or other [facility] requiring further screening, evaluation, or treatment in the ED at this time prior to discharge.  The patient is safe for discharge with strict return precautions.  Shawnette I Fuller was evaluated in Emergency Department on 08/24/2018 for the symptoms described in the history of present illness. She was evaluated in the context of the global COVID-19 pandemic, which necessitated consideration that the patient might be at risk for infection with the SARS-CoV-2 virus  that causes COVID-19. Institutional protocols and algorithms that pertain to the evaluation of patients at risk for COVID-19 are in a state of rapid change based on information released by regulatory bodies including the CDC and federal and state organizations. These policies and algorithms were followed during the patient's care in the ED.       Final Clinical Impression(s) / ED Diagnoses Final diagnoses:  Tension headache     The patient appears reasonably screened and/or stabilized for discharge and I doubt any other medical condition or other [facility] requiring further screening, evaluation, or treatment in the ED at this time prior to discharge.  Disposition: Discharge  Condition: Good  I have discussed the results, Dx and Tx plan with the patient who expressed understanding and agree(s) with the plan. Discharge instructions discussed at great length. The patient was given strict return precautions who verbalized understanding of the instructions. No further questions at time of discharge.    ED Discharge Orders    None        Follow Up: Primary care provider  Schedule an appointment as soon as possible for a visit       This chart was  dictated using voice recognition software.  Despite best efforts to proofread,  errors can occur which can change the documentation meaning.   Nira Connardama, Donelda Mailhot Eduardo, MD 08/24/18 [id number]

## 2018-08-23 NOTE — ED Triage Notes (Signed)
Pt reports headache all over for the past couple days. Pt recently diagnosed with pyelonephritis 2 days ago but is now having fevers and headaches. Pt a.o, nad noted

## 2018-08-24 ENCOUNTER — Telehealth: Payer: Self-pay | Admitting: Emergency Medicine

## 2018-08-24 NOTE — ED Notes (Signed)
Patient verbalizes understanding of discharge instructions. Opportunity for questioning and answers were provided. Armband removed by staff, pt discharged from ED ambulatory.   

## 2018-08-24 NOTE — Telephone Encounter (Signed)
Post ED Visit - Positive Culture Follow-up  Culture report reviewed by antimicrobial stewardship pharmacist: [place] Team []  Elenor Quinones, Pharm.D. []  Heide Guile, Pharm.D., BCPS AQ-ID []  Parks Neptune, Pharm.D., BCPS []  Alycia Rossetti, Pharm.D., BCPS []  [place], [place].D., BCPS, AAHIVP []  Legrand Como, Pharm.D., BCPS, AAHIVP []  Salome Arnt, PharmD, BCPS []  Johnnette Gourd, PharmD, BCPS []  Hughes Better, PharmD, BCPS []  Leeroy Cha, PharmD []  Laqueta Linden, PharmD, BCPS []  Albertina Parr, PharmD Elicia Lamp PharmD  [place] Team []  Leodis Sias, PharmD []  Lindell Spar, PharmD []  Royetta Asal, PharmD []  Graylin Shiver, Rph []  Rema Fendt) Glennon Mac, PharmD []  Arlyn Dunning, PharmD []  Netta Cedars, PharmD []  Dia Sitter, PharmD []  Leone Haven, PharmD []  Gretta Arab, PharmD []  Theodis Shove, PharmD []  Peggyann Juba, PharmD []  Reuel Boom, PharmD   Positive urine culture Treated with cephalexin, organism sensitive to the same and no further patient follow-up is required at this time.  Hazle Nordmann 08/24/2018, 10:27 AM

## 2019-02-26 DIAGNOSIS — F32A Depression, unspecified: Secondary | ICD-10-CM

## 2019-02-26 HISTORY — DX: Depression, unspecified: F32.A

## 2019-08-10 ENCOUNTER — Inpatient Hospital Stay (HOSPITAL_COMMUNITY): Payer: Self-pay

## 2019-08-10 ENCOUNTER — Inpatient Hospital Stay (HOSPITAL_COMMUNITY)
Admission: EM | Admit: 2019-08-10 | Discharge: 2019-08-10 | Disposition: A | Discharge status: Home / Self Care | Payer: Self-pay | Attending: Family Medicine | Admitting: Family Medicine

## 2019-08-10 ENCOUNTER — Encounter (HOSPITAL_COMMUNITY): Payer: Self-pay | Admitting: *Deleted

## 2019-08-10 ENCOUNTER — Other Ambulatory Visit: Payer: Self-pay

## 2019-08-10 DIAGNOSIS — M549 Dorsalgia, unspecified: Secondary | ICD-10-CM | POA: Insufficient documentation

## 2019-08-10 DIAGNOSIS — R110 Nausea: Secondary | ICD-10-CM | POA: Insufficient documentation

## 2019-08-10 DIAGNOSIS — Z3A01 Less than 8 weeks gestation of pregnancy: Secondary | ICD-10-CM | POA: Insufficient documentation

## 2019-08-10 DIAGNOSIS — O26899 Other specified pregnancy related conditions, unspecified trimester: Secondary | ICD-10-CM

## 2019-08-10 DIAGNOSIS — Z79899 Other long term (current) drug therapy: Secondary | ICD-10-CM | POA: Insufficient documentation

## 2019-08-10 DIAGNOSIS — R102 Pelvic and perineal pain: Secondary | ICD-10-CM | POA: Insufficient documentation

## 2019-08-10 DIAGNOSIS — R1031 Right lower quadrant pain: Secondary | ICD-10-CM | POA: Insufficient documentation

## 2019-08-10 DIAGNOSIS — O26891 Other specified pregnancy related conditions, first trimester: Secondary | ICD-10-CM | POA: Insufficient documentation

## 2019-08-10 DIAGNOSIS — Z3491 Encounter for supervision of normal pregnancy, unspecified, first trimester: Secondary | ICD-10-CM

## 2019-08-10 DIAGNOSIS — G8929 Other chronic pain: Secondary | ICD-10-CM | POA: Insufficient documentation

## 2019-08-10 LAB — COMPREHENSIVE METABOLIC PANEL
ALT: 28 U/L (ref 0–44)
AST: 38 U/L (ref 15–41)
Albumin: 3.1 g/dL — ABNORMAL LOW (ref 3.5–5.0)
Alkaline Phosphatase: 85 U/L (ref 38–126)
Anion gap: 7 (ref 5–15)
BUN: 9 mg/dL (ref 6–20)
CO2: 22 mmol/L (ref 22–32)
Calcium: 8.5 mg/dL — ABNORMAL LOW (ref 8.9–10.3)
Chloride: 107 mmol/L (ref 98–111)
Creatinine, Ser: 0.67 mg/dL (ref 0.44–1.00)
GFR calc Af Amer: >60 mL/min (ref >60)
GFR calc non Af Amer: >60 mL/min (ref >60)
Glucose, Bld: 87 mg/dL (ref 70–99)
Potassium: 4.1 mmol/L (ref 3.5–5.1)
Sodium: 136 mmol/L (ref 135–145)
Total Bilirubin: 1.7 mg/dL — ABNORMAL HIGH (ref 0.3–1.2)
Total Protein: 6.4 g/dL — ABNORMAL LOW (ref 6.5–8.1)

## 2019-08-10 LAB — WET PREP, GENITAL
Clue Cells Wet Prep HPF POC: NONE SEEN
Sperm: NONE SEEN
Trich, Wet Prep: NONE SEEN
Yeast Wet Prep HPF POC: NONE SEEN

## 2019-08-10 LAB — CBC
HCT: 39 % (ref 36.0–46.0)
Hemoglobin: 13.7 g/dL (ref 12.0–15.0)
MCH: 34.1 pg — ABNORMAL HIGH (ref 26.0–34.0)
MCHC: 35.1 g/dL (ref 30.0–36.0)
MCV: 97 fL (ref 80.0–100.0)
Platelets: 66 10*3/uL — ABNORMAL LOW (ref 150–400)
RBC: 4.02 MIL/uL (ref 3.87–5.11)
RDW: 12.7 % (ref 11.5–15.5)
WBC: 6.2 10*3/uL (ref 4.0–10.5)
nRBC: 0 % (ref 0.0–0.2)

## 2019-08-10 LAB — POCT PREGNANCY, URINE: Preg Test, Ur: POSITIVE — AB

## 2019-08-10 LAB — ABO/RH: ABO/RH(D): O POS

## 2019-08-10 LAB — HCG, QUANTITATIVE, PREGNANCY: hCG, Beta Chain, Quant, S: 8860 m[IU]/mL — ABNORMAL HIGH (ref <5)

## 2019-08-10 NOTE — MAU Note (Signed)
Presents with c/o pain in lower abdominal & back pain.  States having dark vaginal discharge.  States was told she couldn't get pregnant secondary Hx PCOS.  Reports Hx of irregular cycles, once per year.

## 2019-08-10 NOTE — MAU Provider Note (Signed)
Patient Megan Fuller is a 34 y.o. G1P0  at Unknown [place] who is reporting to MAU for abdominal pain and back pain. She denies vaginal discharge, vaginal bleeding, contractions, nausea, vomiting. She denies SOB, fever, muscle aches, pains.  The pain began 7 days ago. She had a positive home pregnancy test last Thursday (4 days ago). She did not think she could get pregnant because of her PCOS.  History     CSN: [id number]  Arrival date and time: 08/10/19 7169   None     Chief Complaint  Patient presents with  . Back Pain  . Abdominal Pain   Abdominal Pain This is a new problem. The current episode started in the past 7 days. The problem occurs constantly. The pain is located in the RLQ. The pain is at a severity of 8/10. The quality of the pain is cramping. The abdominal pain radiates to the RLQ. Pertinent negatives include no constipation, diarrhea or dysuria. The pain is relieved by being still. She has tried nothing for the symptoms.    OB History    Gravida  1   Para      Term      Preterm      AB      Living        SAB      TAB      Ectopic      Multiple      Live Births              Past Medical History:  Diagnosis Date  . PCOS (polycystic ovarian syndrome)     History reviewed. No pertinent surgical history.  Family History  Problem Relation Age of Onset  . Diabetes Mother     Social History   Tobacco Use  . Smoking status: Never Smoker  . Smokeless tobacco: Never Used  Substance Use Topics  . Alcohol use: No    Alcohol/week: 0.0 standard drinks  . Drug use: No    Allergies: No Known Allergies  Medications Prior to Admission  Medication Sig Dispense Refill Last Dose  . acetaminophen (TYLENOL) 500 MG tablet Take 1 tablet (500 mg total) by mouth every 6 (six) hours as needed. 30 tablet 0 More than a month at Unknown time  . cephALEXin (KEFLEX) 500 MG capsule Take 1 capsule (500 mg total) by mouth 4 (four) times daily. 40 capsule 0  More than a month at Unknown time  . desogestrel-ethinyl estradiol (APRI,EMOQUETTE,SOLIA) 0.15-30 MG-MCG tablet Take 1 tablet by mouth daily. 1 Package 11 More than a month at Unknown time  . drospirenone-ethinyl estradiol (YAZ,GIANVI,LORYNA) 3-0.02 MG tablet Take 1 tablet by mouth daily. (Patient not taking: Reported on 07/06/2014) 1 Package 11 More than a month at Unknown time  . fluconazole (DIFLUCAN) 150 MG tablet Take 1 tablet (150 mg total) by mouth once. Repeat if needed 2 tablet 0 More than a month at Unknown time  . HYDROcodone-acetaminophen (NORCO/VICODIN) 5-325 MG tablet Take 1 tablet by mouth every 6 (six) hours as needed for severe pain. 8 tablet 0 More than a month at Unknown time  . ibuprofen (ADVIL) 600 MG tablet Take 1 tablet (600 mg total) by mouth every 6 (six) hours as needed. 30 tablet 0 More than a month at Unknown time  . medroxyPROGESTERone (PROVERA) 10 MG tablet Take 1 tablet (10 mg total) by mouth daily. Use for ten days (Patient not taking: Reported on 05/16/2014) 10 tablet 2 More than a month  at Unknown time  . metroNIDAZOLE (FLAGYL) 500 MG tablet Take 1 tablet (500 mg total) by mouth 2 (two) times daily. (Patient not taking: Reported on 10/24/2014) 14 tablet 0 More than a month at Unknown time  . Norgestimate-Ethinyl Estradiol Triphasic 0.18/0.215/0.25 MG-25 MCG tab Take 1 tablet by mouth daily. (Patient not taking: Reported on 10/24/2014) 1 Package 11 More than a month at Unknown time  . ondansetron (ZOFRAN) 4 MG tablet Take 1 tablet (4 mg total) by mouth every 6 (six) hours as needed for nausea or vomiting. 12 tablet 0 More than a month at Unknown time    Review of Systems  Constitutional: Negative.   HENT: Negative.   Respiratory: Negative.   Gastrointestinal: Positive for abdominal pain. Negative for constipation and diarrhea.  Genitourinary: Negative for dysuria.  Neurological: Negative.   Psychiatric/Behavioral: Negative.    Physical Exam   Blood pressure 126/79,  pulse 68, temperature 98.1 F (36.7 C), temperature source Oral, resp. rate 20, height 5\' 4"  (1.626 m), weight 77.7 kg, SpO2 100 %.  Physical Exam  Constitutional: She appears well-developed.  HENT:  Head: Normocephalic.  GI: Soft. Bowel sounds are normal.  Genitourinary:    Vagina and right adnexa normal.     Genitourinary Comments: NEFG; cervix is long, closed, posterior. No CMT, positive tenderness over suprapubic area, positive tenderness over RLQ.    Neurological: She is alert.  Skin: Skin is warm.    MAU Course  Procedures  MDM Quant is 8860 Blood Type O pos CMP and CBC normal  -US shows SIUP with yolk sac; measuring 5 weeks and 3 days  Assessment and Plan   1. Viable pregnancy in first trimester   2. Abdominal pain affecting pregnancy    2. Patient stable for discharge with return precautions; wet prep and GC CT pending; will be notified by CNM if positive results.   3. Patient is waiting to make appt at [facility] for care.  4. Return to MAU if any changes in bleeding or pain. Patient and partner verbalized understanding of plan of care.   Mervyn Skeeters Belia Febo 08/10/2019, 7:54 PM

## 2019-08-10 NOTE — Discharge Instructions (Signed)
Primer trimestre de Psychiatrist First Trimester of Pregnancy  El primer trimestre de Psychiatrist se extiende desde la semana1 hasta el final de la semana13 (mes1 al mes3). Durante este tiempo, el beb comenzar a desarrollarse dentro suyo. Entre la semana6 y [place], se forman los ojos y Recruitment consultant, y los latidos del corazn pueden escucharse en la ecografa. Al final de las 12semanas, todos los rganos del beb estn formados. El cuidado prenatal es toda la asistencia mdica que usted recibe antes del nacimiento del beb. Asegrese de recibir un buen cuidado prenatal y de seguir todas las indicaciones del mdico. Siga estas indicaciones en su casa: Medicamentos  Tome los medicamentos de venta libre y los recetados solamente como se lo haya indicado el mdico. Algunos medicamentos son seguros para tomar durante el Psychiatrist y otros no lo son.  Tome vitaminas prenatales que contengan por lo menos (?g) de cido flico.  Si tiene problemas para defecar (estreimiento), tome un medicamento que ablanda la materia fecal (laxante), siempre que lo autorice el mdico. Comida y bebida   Ingiera alimentos saludables de [place] regular.  El Firefighter la cantidad de peso que [place].  No coma carne cruda ni quesos sin cocinar.  Si tiene Programme researcher, broadcasting/film/video (nuseas) o vomita (vmitos): ? Ingiera 4 o 5comidas pequeas por Geophysical data processor de 3abundantes. ? Intente comer algunas galletitas saladas. ? Beba lquidos [employer], en lugar de [employer].  Para evitar el estreimiento: ? Consuma alimentos ricos en fibra, como frutas y verduras frescas, cereales integrales y legumbres. ? Beba suficiente lquido para mantener el pis (orina) claro o de color amarillo plido. Actividad  Haga ejercicios solamente como se lo haya indicado el mdico. Deje de hacer ejercicios si tiene clicos o dolor en la parte baja del vientre (abdomen) o en la cintura.  No haga  actividad fsica si el clima est demasiado caluroso o hmedo, o si se encuentra en un lugar muy alto (altitud elevada).  Intente no estar de pie FedEx. Mueva las piernas con frecuencia si debe estar de pie en un lugar durante mucho tiempo.  Evite levantar pesos [employer].  Use zapatos con tacones bajos. Mantenga una buena postura al sentarse y pararse.  Puede tener [employer] [place] [employer], a menos que el mdico le indique lo contrario. Alivio del dolor y del Dentist  Use un sostn que le brinde buen soporte si le duelen las [place].  Dese baos de asiento con agua tibia para Engineer, materials o las molestias causadas por las hemorroides. Use una crema antihemorroidal si el mdico se lo permite.  Descanse con las piernas elevadas si tiene calambres o dolor de cintura.  Si tiene las venas de las piernas hinchadas y abultadas (venas varicosas): ? Use medias elsticas de soporte o medias de compresin como se lo haya indicado el mdico. ? Levante (eleve) los pies durante , 3 o 4veces por Futures trader. ? Limite la sal en sus alimentos. Cuidado prenatal  Programe las visitas prenatales para la semana12 de [place].  Escriba sus preguntas. Llvelas cuando concurra a las visitas prenatales.  Concurra a todas las visitas prenatales como se lo haya indicado el mdico. Esto es importante. Seguridad  Use el cinturn de seguridad en todo momento mientras conduce.  Haga una lista con los nmeros de telfono en caso de Associate Professor. Esta lista debe incluir los nmeros de los familiares, los amigos, el hospital y los departamentos de polica y de bomberos. Instrucciones generales  Pdale  al mdico que la derive a clases prenatales en su localidad. Debe comenzar a tomar las clases antes de Cytogeneticist en el mes6 de embarazo.  Pida ayuda si necesita asesoramiento o asistencia con la alimentacin. El mdico puede aconsejarla o indicarle dnde recurrir para recibir [place].  No se d baos de  inmersin en agua caliente, baos turcos ni saunas.  No se haga duchas vaginales ni use tampones o toallas higinicas perfumadas.  No mantenga las piernas cruzadas durante [place].  Evite las hierbas y el alcohol. Evite los frmacos que el mdico no haya autorizado.  No consuma ningn producto que contenga tabaco, lo que incluye cigarrillos, tabaco de Theatre manager o Administrator, Civil Service. Si necesita ayuda para dejar de fumar, consulte al American Express. Puede recibir asesoramiento u otro tipo de apoyo para dejar de fumar.  Evite el contacto con las bandejas sanitarias de los gatos y la tierra que estos animales usan. Estos elementos contienen grmenes que pueden causar defectos congnitos al beb y la posible prdida del feto (aborto espontneo) o muerte fetal.  Visite al dentista. En su casa, lvese los dientes con un cepillo dental suave. Psese el hilo dental con suavidad. Comunquese con un mdico si:  Tiene mareos.  Tiene clicos leves o siente presin en la parte baja del vientre.  Sufre un dolor persistente en el abdomen.  Sigue teniendo AT[employer]T, vomita o la materia fecal es lquida (diarrea).  Nota una secrecin de lquido con olor ftido que proviene de la vagina.  Tiene dolor al hacer pis (orinar).  Tiene el rostro, las [place], las piernas o los tobillos ms hinchados (inflamados). Solicite ayuda de inmediato si:  Tiene fiebre.  Tiene una prdida de lquido por la vagina.  Tiene sangrado o pequeas prdidas vaginales.  Tiene clicos o dolor muy intensos en el vientre.  Sube o baja de peso rpidamente.  Vomita sangre. Esto tiene Art gallery manager similar a la borra del caf.  Est en contacto con personas que tienen rubola, la quinta enfermedad o varicela.  Siente un dolor de cabeza muy intenso.  Le falta el aire.  Sufre cualquier tipo de traumatismo, por ejemplo, debido a una cada o un accidente automovilstico. Resumen  El primer trimestre de Psychiatrist se  extiende desde la semana1 hasta el final de la semana13 (mes1 al mes3).  Para cuidar su salud y la del beb en gestacin, necesitar consumir alimentos saludables, tomar medicamentos solamente si lo autoriza el mdico, y Radio producer actividades que sean seguras para usted y para su beb.  Concurra a todas las visitas de control como se lo haya indicado el mdico. Esto es importante porque el mdico deber asegurar que el beb est saludable y est creciendo bien. Esta informacin no tiene Theme park manager el consejo del mdico. Asegrese de hacerle al mdico cualquier pregunta que tenga. Document Revised: 09/17/2016 Document Reviewed: 09/17/2016 Elsevier Patient Education  2020 [employer].

## 2019-08-10 NOTE — Progress Notes (Signed)
Discharge papers given to patient. Pt denies any questions or concerns. Pt being discharged with significant other.

## 2019-08-10 NOTE — ED Provider Notes (Addendum)
Triage staff asked me to evaluate this patient to see if appropriate for transfer to [facility].  Chief Complaint: Pelvic pain, [redacted] weeks pregnant  HPI:   Patient reports she is history of PCOS and has chronic pelvic pain and irregular periods.  She believes she is [redacted] weeks pregnant today, diagnosed herself by a home pregnancy test.  Patient reports pelvic pain as a cramping sensation mild feels similar to her chronic pain.  She reports some mild nausea as well.  Patient reports that this morning she had some brown appearing vaginal discharge but no bleeding or fluid loss.  ROS: + Pelvic pain, vaginal discharge           -Fever/chills, chest pain/shortness of breath, vomiting, diarrhea, dysuria/hematuria or any additional concerns.  Physical Exam:   Gen: No distress  Neuro: Awake and Alert  Skin: Warm    Focused Exam: Abdomen nontender.  Patient has no pregnancy test through EMR system will obtain urine pregnancy test prior to calling [facility]. - Informed by RN that pregnancy test is positive.  5:10 PM: Discussed case with Joni Reining [facility] provider, advises patient may be transferred to their department.   Initiation of care has begun. The patient has been counseled on the process, plan, and necessity for staying for the completion/evaluation, and the remainder of the medical screening examination   Note: Portions of this report may have been transcribed using voice recognition software. Every effort was made to ensure accuracy; however, inadvertent computerized transcription errors may still be present.    Megan Fuller 08/10/19 1710    Geoffery Lyons, MD 08/10/19 2300

## 2019-08-12 LAB — GC/CHLAMYDIA PROBE AMP (~~LOC~~) NOT AT ARMC
Chlamydia: NEGATIVE
Comment: NEGATIVE
Comment: NORMAL
Neisseria Gonorrhea: NEGATIVE

## 2019-09-12 ENCOUNTER — Emergency Department (HOSPITAL_COMMUNITY): Payer: No Typology Code available for payment source

## 2019-09-12 ENCOUNTER — Other Ambulatory Visit: Payer: Self-pay

## 2019-09-12 ENCOUNTER — Emergency Department (HOSPITAL_COMMUNITY)
Admission: EM | Admit: 2019-09-12 | Discharge: 2019-09-12 | Disposition: A | Discharge status: Home / Self Care | Payer: No Typology Code available for payment source | Attending: Emergency Medicine | Admitting: Emergency Medicine

## 2019-09-12 DIAGNOSIS — O9A211 Injury, poisoning and certain other consequences of external causes complicating pregnancy, first trimester: Secondary | ICD-10-CM | POA: Diagnosis not present

## 2019-09-12 DIAGNOSIS — Y92410 Unspecified street and highway as the place of occurrence of the external cause: Secondary | ICD-10-CM | POA: Diagnosis not present

## 2019-09-12 DIAGNOSIS — Y9389 Activity, other specified: Secondary | ICD-10-CM | POA: Insufficient documentation

## 2019-09-12 DIAGNOSIS — Z3A00 Weeks of gestation of pregnancy not specified: Secondary | ICD-10-CM | POA: Insufficient documentation

## 2019-09-12 DIAGNOSIS — R109 Unspecified abdominal pain: Secondary | ICD-10-CM | POA: Diagnosis not present

## 2019-09-12 DIAGNOSIS — M79602 Pain in left arm: Secondary | ICD-10-CM | POA: Insufficient documentation

## 2019-09-12 DIAGNOSIS — Y998 Other external cause status: Secondary | ICD-10-CM | POA: Insufficient documentation

## 2019-09-12 DIAGNOSIS — W2210XA Striking against or struck by unspecified automobile airbag, initial encounter: Secondary | ICD-10-CM | POA: Insufficient documentation

## 2019-09-12 DIAGNOSIS — M549 Dorsalgia, unspecified: Secondary | ICD-10-CM | POA: Insufficient documentation

## 2019-09-12 DIAGNOSIS — R519 Headache, unspecified: Secondary | ICD-10-CM | POA: Insufficient documentation

## 2019-09-12 DIAGNOSIS — Z3491 Encounter for supervision of normal pregnancy, unspecified, first trimester: Secondary | ICD-10-CM

## 2019-09-12 LAB — CBC WITH DIFFERENTIAL/PLATELET
Abs Immature Granulocytes: 0.02 10*3/uL (ref 0.00–0.07)
Basophils Absolute: 0 10*3/uL (ref 0.0–0.1)
Basophils Relative: 1 %
Eosinophils Absolute: 0.2 10*3/uL (ref 0.0–0.5)
Eosinophils Relative: 2 %
HCT: 36.1 % (ref 36.0–46.0)
Hemoglobin: 12.5 g/dL (ref 12.0–15.0)
Immature Granulocytes: 0 %
Lymphocytes Relative: 19 %
Lymphs Abs: 1.3 10*3/uL (ref 0.7–4.0)
MCH: 34 pg (ref 26.0–34.0)
MCHC: 34.6 g/dL (ref 30.0–36.0)
MCV: 98.1 fL (ref 80.0–100.0)
Monocytes Absolute: 0.5 10*3/uL (ref 0.1–1.0)
Monocytes Relative: 7 %
Neutro Abs: 4.7 10*3/uL (ref 1.7–7.7)
Neutrophils Relative %: 71 %
Platelets: 55 10*3/uL — ABNORMAL LOW (ref 150–400)
RBC: 3.68 MIL/uL — ABNORMAL LOW (ref 3.87–5.11)
RDW: 12.8 % (ref 11.5–15.5)
WBC: 6.6 10*3/uL (ref 4.0–10.5)
nRBC: 0 % (ref 0.0–0.2)

## 2019-09-12 LAB — URINALYSIS, ROUTINE W REFLEX MICROSCOPIC
Bilirubin Urine: NEGATIVE
Glucose, UA: NEGATIVE mg/dL
Hgb urine dipstick: NEGATIVE
Ketones, ur: NEGATIVE mg/dL
Leukocytes,Ua: NEGATIVE
Nitrite: NEGATIVE
Protein, ur: NEGATIVE mg/dL
Specific Gravity, Urine: 1.005 (ref 1.005–1.030)
pH: 7 (ref 5.0–8.0)

## 2019-09-12 LAB — COMPREHENSIVE METABOLIC PANEL
ALT: 16 U/L (ref 0–44)
AST: 33 U/L (ref 15–41)
Albumin: 2.8 g/dL — ABNORMAL LOW (ref 3.5–5.0)
Alkaline Phosphatase: 65 U/L (ref 38–126)
Anion gap: 6 (ref 5–15)
BUN: 8 mg/dL (ref 6–20)
CO2: 22 mmol/L (ref 22–32)
Calcium: 8.8 mg/dL — ABNORMAL LOW (ref 8.9–10.3)
Chloride: 110 mmol/L (ref 98–111)
Creatinine, Ser: 0.72 mg/dL (ref 0.44–1.00)
GFR calc Af Amer: >60 mL/min (ref >60)
GFR calc non Af Amer: >60 mL/min (ref >60)
Glucose, Bld: 85 mg/dL (ref 70–99)
Potassium: 4.1 mmol/L (ref 3.5–5.1)
Sodium: 138 mmol/L (ref 135–145)
Total Bilirubin: 1 mg/dL (ref 0.3–1.2)
Total Protein: 5.7 g/dL — ABNORMAL LOW (ref 6.5–8.1)

## 2019-09-12 LAB — ABO/RH: ABO/RH(D): O POS

## 2019-09-12 MED ORDER — ACETAMINOPHEN 500 MG PO TABS
500.0000 mg | ORAL_TABLET | Freq: Once | ORAL | Status: AC
  Administered 2019-09-12: 500 mg via ORAL
  Filled 2019-09-12: qty 1

## 2019-09-12 NOTE — Discharge Instructions (Addendum)
You may take tylenol 500mg  every 4 hours as needed for pain.
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
## 2019-09-12 NOTE — ED Notes (Signed)
Patient verbalizes understanding of discharge instructions. Opportunity for questioning and answers were provided. Armband removed by staff, pt discharged from ED stable & ambulatory  

## 2019-09-12 NOTE — ED Notes (Signed)
Pt returned from CT at this time.  

## 2019-09-12 NOTE — ED Notes (Signed)
Pt requesting restroom, was advised by this RN that walking was nor recommended due to pt's chief complaint/lack of scans completed. Pt responded that she had "already walked" without assistance from staff or notification to same. Pt again advised that walking without having scans done was not recommended, pt refusing to use purewick/bedpan at this time. Pt ambulatory to RR, no assistance from staff needed, NAD noted, gait steady & even.

## 2019-09-12 NOTE — ED Notes (Signed)
Pt transported to CT via stretcher.  
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
## 2019-09-12 NOTE — ED Notes (Signed)
Pt reminded of need for urine specimen 

## 2019-10-20 ENCOUNTER — Other Ambulatory Visit: Payer: Self-pay

## 2019-10-20 DIAGNOSIS — Z3481 Encounter for supervision of other normal pregnancy, first trimester: Secondary | ICD-10-CM

## 2019-10-21 LAB — OBSTETRIC PANEL, INCLUDING HIV
Antibody Screen: NEGATIVE
Basophils Absolute: 0 10*3/uL (ref 0.0–0.2)
Basos: 1 %
EOS (ABSOLUTE): 0.1 10*3/uL (ref 0.0–0.4)
Eos: 2 %
HIV Screen 4th Generation wRfx: NONREACTIVE
Hematocrit: 35.3 % (ref 34.0–46.6)
Hemoglobin: 11.8 g/dL (ref 11.1–15.9)
Hepatitis B Surface Ag: NEGATIVE
Immature Grans (Abs): 0 10*3/uL (ref 0.0–0.1)
Immature Granulocytes: 0 %
Lymphocytes Absolute: 0.9 10*3/uL (ref 0.7–3.1)
Lymphs: 16 %
MCH: 33.3 pg — ABNORMAL HIGH (ref 26.6–33.0)
MCHC: 33.4 g/dL (ref 31.5–35.7)
MCV: 100 fL — ABNORMAL HIGH (ref 79–97)
Monocytes Absolute: 0.3 10*3/uL (ref 0.1–0.9)
Monocytes: 6 %
Neutrophils Absolute: 4.4 10*3/uL (ref 1.4–7.0)
Neutrophils: 75 %
Platelets: 75 10*3/uL — CL (ref 150–450)
RBC: 3.54 x10E6/uL — ABNORMAL LOW (ref 3.77–5.28)
RDW: 13.3 % (ref 11.7–15.4)
RPR Ser Ql: NONREACTIVE
Rh Factor: POSITIVE
Rubella Antibodies, IGG: 16.8 index (ref >0.99)
WBC: 5.8 10*3/uL (ref 3.4–10.8)

## 2019-10-21 LAB — HCV AB W REFLEX TO QUANT PCR: HCV Ab: 0.1 s/co ratio (ref 0.0–0.9)

## 2019-10-21 LAB — HCV INTERPRETATION

## 2019-10-22 ENCOUNTER — Telehealth: Payer: Self-pay | Admitting: Family Medicine

## 2019-10-22 NOTE — Telephone Encounter (Signed)
Called patient with Spanish pacific interpreter to discuss low platelets. She is not currently having any bleeding but had bleeding 1 week ago. Asked patient to call back if she has any bleeding before her first OB appointment. She voiced understanding.   Clinton Wahlberg Autry-Lott, DO 10/22/2019, 1:40 PM PGY-2, [facility]

## 2019-10-24 LAB — URINE CULTURE, OB REFLEX

## 2019-10-24 LAB — CULTURE, OB URINE

## 2019-10-25 ENCOUNTER — Other Ambulatory Visit: Payer: Self-pay | Admitting: Family Medicine

## 2019-10-25 ENCOUNTER — Telehealth: Payer: Self-pay | Admitting: Family Medicine

## 2019-10-25 DIAGNOSIS — O2342 Unspecified infection of urinary tract in pregnancy, second trimester: Secondary | ICD-10-CM

## 2019-10-25 MED ORDER — CEPHALEXIN 500 MG PO CAPS: 500.0000 mg | ORAL_CAPSULE | Freq: Four times a day (QID) | ORAL | 0 refills | Status: AC

## 2019-10-25 NOTE — Telephone Encounter (Signed)
Attempted to call patient with pacific interpreters to inform her of UTI and treatment sent to pharmacy. Not able to leave a voicemail. Patient has appointment with me 10/27/2019.  Lavonda Jumbo, DO 10/25/2019, 6:34 PM PGY-2, [facility]

## 2019-10-27 ENCOUNTER — Other Ambulatory Visit: Payer: Self-pay

## 2019-10-27 ENCOUNTER — Ambulatory Visit (INDEPENDENT_AMBULATORY_CARE_PROVIDER_SITE_OTHER): Payer: Self-pay | Admitting: Family Medicine

## 2019-10-27 VITALS — BP 120/72 | HR 80 | Wt 172.2 lb

## 2019-10-27 DIAGNOSIS — O99342 Other mental disorders complicating pregnancy, second trimester: Secondary | ICD-10-CM

## 2019-10-27 DIAGNOSIS — Z8742 Personal history of other diseases of the female genital tract: Secondary | ICD-10-CM | POA: Insufficient documentation

## 2019-10-27 DIAGNOSIS — F329 Major depressive disorder, single episode, unspecified: Secondary | ICD-10-CM

## 2019-10-27 DIAGNOSIS — F32A Depression, unspecified: Secondary | ICD-10-CM

## 2019-10-27 DIAGNOSIS — N390 Urinary tract infection, site not specified: Secondary | ICD-10-CM | POA: Insufficient documentation

## 2019-10-27 DIAGNOSIS — O2342 Unspecified infection of urinary tract in pregnancy, second trimester: Secondary | ICD-10-CM

## 2019-10-27 DIAGNOSIS — O99112 Other diseases of the blood and blood-forming organs and certain disorders involving the immune mechanism complicating pregnancy, second trimester: Secondary | ICD-10-CM

## 2019-10-27 DIAGNOSIS — Z3A16 16 weeks gestation of pregnancy: Secondary | ICD-10-CM

## 2019-10-27 DIAGNOSIS — D693 Immune thrombocytopenic purpura: Secondary | ICD-10-CM | POA: Insufficient documentation

## 2019-10-27 DIAGNOSIS — Z3492 Encounter for supervision of normal pregnancy, unspecified, second trimester: Secondary | ICD-10-CM | POA: Insufficient documentation

## 2019-10-27 HISTORY — DX: Unspecified infection of urinary tract in pregnancy, second trimester: O23.42

## 2019-10-27 NOTE — Assessment & Plan Note (Signed)
PHQ 9 score 15. Negative for SI/HI.  -Counseling services offered, patient declined

## 2019-10-27 NOTE — Assessment & Plan Note (Signed)
Referral to OB/GYN

## 2019-10-27 NOTE — Patient Instructions (Addendum)
It was nice to meet you.  Please continue your prenatal vitamin  Also take the antibiotic complete course for UTI  You have been referred to another OB/GYN office to cover your pregnancy you should get a call within a week to schedule an appointment.  If you do not please call us back and let us know at [phone].  You are also scheduled for an ultrasound 11/08/19 @ 1:00pm at [facility].   Segundo trimestre de [employer] Trimester of Pregnancy  El segundo trimestre va desde la semana14 hasta la 27 (desde el mes 4 hasta el 6). Este suele ser el momento en el que mejor se siente. En general, las nuseas matutinas han disminuido o han desaparecido completamente. Tendr ms energa y podr aumentarle el apetito. El beb en gestacin se desarrolla rpidamente. Hacia el final del sexto mes, el beb mide aproximadamente 9 pulgadas (23 cm) y pesa alrededor de 1 libras (700 g). Es probable que sienta al beb [employer] 18 y 20 semanas del [place]. Siga estas indicaciones en su casa: Medicamentos  [employer] de venta libre y los recetados solamente como se lo haya indicado el mdico. Algunos medicamentos son seguros para tomar durante el Psychiatrist y otros no lo son.  Tome vitaminas prenatales que contengan por lo menos (?g) de cido flico.  Si tiene dificultad para mover el intestino (estreimiento), tome un medicamento para ablandar las heces (laxante) si su mdico se lo autoriza. Comida y bebida   Ingiera alimentos saludables de [place] regular.  No coma carne cruda ni quesos sin cocinar.  Si obtiene poca cantidad de calcio de los alimentos que ingiere, consulte a su mdico sobre la posibilidad de tomar un suplemento diario de calcio.  Evite el consumo de alimentos ricos en grasas y azcares, como los alimentos fritos y los dulces.  Si tiene Programme researcher, broadcasting/film/video (nuseas) o devuelve (vomita): ? Ingiera 4 o 5comidas pequeas por Geophysical data processor de  3abundantes. ? Intente comer algunas galletitas saladas. ? Beba lquidos [employer], en lugar de [employer].  Para evitar el estreimiento: ? Consuma alimentos ricos en fibra, como frutas y verduras frescas, cereales integrales y frijoles. ? Beba suficiente lquido para mantener el pis (orina) claro o de color amarillo plido. Actividad  Haga ejercicios solamente como se lo haya indicado el mdico. Interrumpa la actividad fsica si comienza a tener calambres.  No haga ejercicio si hace demasiado calor, hay demasiada humedad o se encuentra en un lugar de mucha altura (altitud alta).  Evite levantar pesos [employer].  Use zapatos con tacones bajos. Mantenga una buena postura al sentarse y pararse.  Puede continuar teniendo [employer], a menos que el mdico le indique lo contrario. Alivio del dolor y del Dentist  Use un sostn que le brinde buen soporte si sus mamas estn sensibles.  Dese baos de asiento con agua tibia para Engineer, materials o las molestias causadas por las hemorroides. Use una crema para las hemorroides si el mdico la autoriza.  Descanse con las piernas elevadas si tiene calambres o dolor de cintura.  Si desarrolla venas hinchadas y abultadas (vrices) en las piernas: ? Use medias de compresin o medias de descanso como se lo haya indicado el mdico. ? Levante (eleve) los pies durante , 3 o 4veces por Futures trader. ? Limite el consumo de sal en sus alimentos. Cuidado prenatal  Tonga sus preguntas. Llvelas cuando concurra a las visitas prenatales.  Concurra a todas las visitas prenatales como  se lo haya indicado el mdico. Esto es importante. Seguridad  [employer] cinturn de seguridad cuando conduzca.  Haga una lista de los nmeros de telfono de Associate Professor, que [employer] nmeros de telfono de familiares, amigos, [place] [facility], as como los departamentos de polica y bomberos. Instrucciones generales  Consulte a su mdico  sobre los [employer] debe comer o pdale que la ayude a Clinical research associate a quien pueda aconsejarla si necesita ese servicio.  Consulte a su mdico acerca de dnde se dictan clases prenatales cerca de donde vive. Comience las clases antes del mes 6 de embarazo.  No se d baos de inmersin en agua caliente, baos turcos ni saunas.  No se haga duchas vaginales ni use tampones o toallas higinicas perfumadas.  No mantenga las piernas cruzadas durante [place].  Vaya al dentista si an no lo hizo. Use un cepillo de cerdas suaves para cepillarse los dientes. Psese el hilo dental suavemente.  No fume, no consuma hierbas ni beba alcohol. No tome frmacos que el mdico no haya autorizado.  No consuma ningn producto que contenga nicotina o tabaco, como cigarrillos y Administrator, Civil Service. Si necesita ayuda para dejar de fumar, consulte al mdico.  Evite el contacto con las bandejas sanitarias de los gatos y la tierra que estos animales usan. Estos elementos contienen bacterias que pueden causar defectos congnitos al beb y la posible prdida del beb (aborto espontneo) o la muerte fetal. Comunquese con un mdico si:  Tiene clicos leves o siente presin en la parte baja del vientre.  Tiene dolor al hacer pis (orinar).  Advierte un lquido con olor ftido que proviene de la vagina.  Tiene Programme researcher, broadcasting/film/video (nuseas), devuelve (vomita) o tiene deposiciones acuosas (diarrea).  Sufre un dolor persistente en el abdomen.  Siente mareos. Solicite ayuda de inmediato si:  Tiene fiebre.  Tiene una prdida de lquido por la vagina.  Tiene sangrado o pequeas prdidas vaginales.  Siente dolor intenso o clicos en el abdomen.  Sube o baja de peso rpidamente.  Tiene dificultades para recuperar el aliento y siente dolor en el pecho.  Sbitamente se le hinchan mucho el rostro, las [place], los tobillos, los pies o las piernas.  No ha sentido los movimientos del beb durante Georgianne Fick.  Siente un dolor de cabeza intenso que no se alivia al tomar [employer].  Tiene dificultad para ver. Resumen  El segundo trimestre va desde la semana14 hasta la 27, desde el mes 4 hasta el 6. Este suele ser el momento en el que mejor se siente.  Para cuidarse y cuidar a su beb en gestacin, debe comer alimentos saludables, tomar medicamentos solamente si su mdico le indica que lo haga y hacer actividades que sean seguras para usted y su beb.  Llame al mdico si se enferma o si nota algo inusual acerca de su embarazo. Tambin llame al mdico si necesita ayuda para saber qu alimentos debe comer o si quiere saber qu actividades puede realizar de forma segura. Esta informacin no tiene Theme park manager el consejo del mdico. Asegrese de hacerle al mdico cualquier pregunta que tenga. Document Revised: 11/06/2016 Document Reviewed: 11/06/2016 Elsevier Patient Education  2020 [employer].

## 2019-10-27 NOTE — Assessment & Plan Note (Signed)
E. Coli pansensitive -Keflex 500 mg QID x5 days -Need TOC

## 2019-10-27 NOTE — Assessment & Plan Note (Signed)
Not currently experiencing bleeding.  [place] 8[place]22-8/26 bleeding occurred during trip, sought care and had an ultrasound and was said to be fine.

## 2019-10-27 NOTE — Progress Notes (Addendum)
Patient Name: Megan Fuller Date of Birth: 19-Jun-1985 [facility] Initial Prenatal Visit  Megan Fuller is a 34 y.o. year old G1P0 at [redacted]w[redacted]d who presents for her initial prenatal visit. Pregnancy is not planned She reports morning sickness and lower abdominal cramping, back pain, insomnia. Pelvic pain. She is taking a prenatal vitamin.  She denies vaginal bleeding.   Pregnancy Dating: . The patient is dated by early ultrasound.  Marland Kitchen LMP: unsure . Period is certain:  No.  . Periods were regular:  No.  . LMP was a typical period:  No.  . Using hormonal contraception in 3 months prior to conception: No  Lab Review: . Blood type: O . Rh Status: + . Antibody screen: Negative . HIV: Negative . RPR: Negative . Hemoglobin electrophoresis reviewed: No . Results of OB urine culture are: Positive for E. Coli . Rubella: Immune . Hep C Ab: Negative . Varicella status is Immune  PMH: Reviewed and as detailed below: . HTN: No  . Type 1 or 2 Diabetes: No  . Depression:  Yes  . Seizure disorder:  No . VTE: No ,  . History of STI No,  . Abnormal Pap smear:  No, . Genital herpes simplex:  No   PSH: . Gynecologic Surgery:  no . Surgical history reviewed, notable for: no surgical hx  Obstetric History: . Obstetric history tab updated and reviewed.  . Summary of prior pregnancies: G1P0 . Cesarean delivery: No  . Gestational Diabetes:  No . Hypertension in pregnancy: No . History of preterm birth: No . History of LGA/SGA infant:  No . History of shoulder dystocia: No . History of ITP . Indications for referral were reviewed, and the patient has obstetric indications for referral to High Risk OB Clinic at this time.   Social History: . Partner's name: Elita Quick . Tobacco use: No . Alcohol use:  No . Other substance use:  No  Current Medications:  . PNV  . Reviewed and appropriate in pregnancy.   Genetic and Infection Screen: . Flow Sheet Updated  Yes  Prenatal Exam: Gen: Well nourished, well developed.  No distress.  Vitals noted. HEENT: Normocephalic, atraumatic.  Neck supple without cervical lymphadenopathy, thyromegaly or thyroid nodules.  Fair dentition. CV: RRR no murmur, gallops or rubs Lungs: CTA B.  Normal respiratory effort without wheezes or rales. Abd: soft, NTND. +BS.  Uterus not appreciated above pelvis. Ext: No clubbing, cyanosis or edema. Psych: Normal grooming and dress.  Not depressed or anxious appearing.  Normal thought content and process without flight of ideas or looseness of associations  Fetal heart tones: Appropriate  Assessment/Plan:  Megan Fuller is a 34 y.o. G1P0 at [redacted]w[redacted]d who presents to initiate prenatal care. She is doing well.  Current pregnancy issues include:   1. Routine prenatal care: Marland Kitchen As dating is reliable, a dating ultrasound has been ordered. Dating tab updated. . Pre-pregnancy weight updated. Expected weight gain this pregnancy is 25-35 pounds  . Prenatal labs reviewed, notable for Platelets 75, urine cx E. Coli. . Indications for referral to HROB were reviewed and the patient does meet criteria for referral. Referral placed during this encounter.  . Medication list reviewed and updated.  . Bleeding and pain precautions reviewed. . Importance of prenatal vitamins reviewed.  . Genetic screening offered. Patient opted for: quad screen at 16-20 weeks. . The patient will not be age 16 or over at time of delivery. Referral to genetic counseling was not offered  today.  . The patient has the following risk factors for preexisting diabetes: BMI > 25 and high risk ethnicity (Latino, [place] American, Native American, [place] Islander, Asian Naval architect) . An early 1 hour glucose tolerance test was not ordered. . Pregnancy Medical Home and PHQ-9 forms completed, problems noted: Yes, did not want to be pregnant at this time but later.   2. 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 Seen hematology in the past  -Referral to OB/GYN -Consider referral to hematology   E. Coli UTI -Keflex 500mg  QID x5 days -Will need TOC   Hx of vaginal bleeding in the second trimester [place]22[place] bleeding occurred during trip, sought care and had an ultrasound and was said to be fine.  -Anatomy 05-05-1988 scheduled for 11/08/2019 @ [facility]   Maternal depression PHQ9 score 15. Negative for SI/HI -Counseling offered, patient declined   Will follow up with OB/GYN.

## 2019-11-25 ENCOUNTER — Encounter: Payer: Self-pay | Admitting: Obstetrics and Gynecology

## 2019-11-25 ENCOUNTER — Other Ambulatory Visit (HOSPITAL_COMMUNITY)
Admission: RE | Admit: 2019-11-25 | Discharge: 2019-11-25 | Disposition: A | Discharge status: Home / Self Care | Payer: Self-pay | Source: Ambulatory Visit | Attending: Obstetrics and Gynecology | Admitting: Obstetrics and Gynecology

## 2019-11-25 ENCOUNTER — Other Ambulatory Visit: Payer: Self-pay

## 2019-11-25 ENCOUNTER — Ambulatory Visit (INDEPENDENT_AMBULATORY_CARE_PROVIDER_SITE_OTHER): Payer: Self-pay | Admitting: Obstetrics and Gynecology

## 2019-11-25 VITALS — BP 124/91 | Wt 177.0 lb

## 2019-11-25 DIAGNOSIS — O0990 Supervision of high risk pregnancy, unspecified, unspecified trimester: Secondary | ICD-10-CM | POA: Insufficient documentation

## 2019-11-25 DIAGNOSIS — O99112 Other diseases of the blood and blood-forming organs and certain disorders involving the immune mechanism complicating pregnancy, second trimester: Secondary | ICD-10-CM

## 2019-11-25 DIAGNOSIS — Z3A20 20 weeks gestation of pregnancy: Secondary | ICD-10-CM

## 2019-11-25 DIAGNOSIS — D693 Immune thrombocytopenic purpura: Secondary | ICD-10-CM

## 2019-11-25 NOTE — Patient Instructions (Signed)
Segundo trimestre de embarazo Second Trimester of Pregnancy  El segundo trimestre va desde la semana14 hasta la 27 (desde el mes 4 hasta el 6). Este suele ser el momento en el que mejor se siente. En general, las nuseas matutinas han disminuido o han desaparecido completamente. Tendr ms energa y podr aumentarle el apetito. El beb en gestacin se desarrolla rpidamente. Hacia el final del sexto mes, el beb mide aproximadamente 9 pulgadas (23 cm) y pesa alrededor de 1 libras (700 g). Es probable que sienta al beb moverse entre las 18 y 20 semanas del embarazo. Siga estas indicaciones en su casa: Medicamentos  Tome los medicamentos de venta libre y los recetados solamente como se lo haya indicado el mdico. Algunos medicamentos son seguros para tomar durante el embarazo y otros no lo son.  Tome vitaminas prenatales que contengan por lo menos 600microgramos (?g) de cido flico.  Si tiene dificultad para mover el intestino (estreimiento), tome un medicamento para ablandar las heces (laxante) si su mdico se lo autoriza. Comida y bebida   Ingiera alimentos saludables de manera regular.  No coma carne cruda ni quesos sin cocinar.  Si obtiene poca cantidad de calcio de los alimentos que ingiere, consulte a su mdico sobre la posibilidad de tomar un suplemento diario de calcio.  Evite el consumo de alimentos ricos en grasas y azcares, como los alimentos fritos y los dulces.  Si tiene malestar estomacal (nuseas) o devuelve (vomita): ? Ingiera 4 o 5comidas pequeas por da en lugar de 3abundantes. ? Intente comer algunas galletitas saladas. ? Beba lquidos entre las comidas, en lugar de hacerlo durante estas.  Para evitar el estreimiento: ? Consuma alimentos ricos en fibra, como frutas y verduras frescas, cereales integrales y frijoles. ? Beba suficiente lquido para mantener el pis (orina) claro o de color amarillo plido. Actividad  Haga ejercicios solamente como se lo haya  indicado el mdico. Interrumpa la actividad fsica si comienza a tener calambres.  No haga ejercicio si hace demasiado calor, hay demasiada humedad o se encuentra en un lugar de mucha altura (altitud alta).  Evite levantar pesos excesivos.  Use zapatos con tacones bajos. Mantenga una buena postura al sentarse y pararse.  Puede continuar teniendo relaciones sexuales, a menos que el mdico le indique lo contrario. Alivio del dolor y del malestar  Use un sostn que le brinde buen soporte si sus mamas estn sensibles.  Dese baos de asiento con agua tibia para aliviar el dolor o las molestias causadas por las hemorroides. Use una crema para las hemorroides si el mdico la autoriza.  Descanse con las piernas elevadas si tiene calambres o dolor de cintura.  Si desarrolla venas hinchadas y abultadas (vrices) en las piernas: ? Use medias de compresin o medias de descanso como se lo haya indicado el mdico. ? Levante (eleve) los pies durante 15minutos, 3 o 4veces por da. ? Limite el consumo de sal en sus alimentos. Cuidado prenatal  Escriba sus preguntas. Llvelas cuando concurra a las visitas prenatales.  Concurra a todas las visitas prenatales como se lo haya indicado el mdico. Esto es importante. Seguridad  Colquese el cinturn de seguridad cuando conduzca.  Haga una lista de los nmeros de telfono de emergencia, que incluya los nmeros de telfono de familiares, amigos, el hospital, as como los departamentos de polica y bomberos. Instrucciones generales  Consulte a su mdico sobre los alimentos que debe comer o pdale que la ayude a encontrar a quien pueda aconsejarla si necesita ese servicio.    Consulte a su mdico acerca de dnde se dictan clases prenatales cerca de donde vive. Comience las clases antes del mes 6 de embarazo.  No se d baos de inmersin en agua caliente, baos turcos ni saunas.  No se haga duchas vaginales ni use tampones o toallas higinicas perfumadas.   No mantenga las piernas cruzadas durante mucho tiempo.  Vaya al dentista si an no lo hizo. Use un cepillo de cerdas suaves para cepillarse los dientes. Psese el hilo dental suavemente.  No fume, no consuma hierbas ni beba alcohol. No tome frmacos que el mdico no haya autorizado.  No consuma ningn producto que contenga nicotina o tabaco, como cigarrillos y cigarrillos electrnicos. Si necesita ayuda para dejar de fumar, consulte al mdico.  Evite el contacto con las bandejas sanitarias de los gatos y la tierra que estos animales usan. Estos elementos contienen bacterias que pueden causar defectos congnitos al beb y la posible prdida del beb (aborto espontneo) o la muerte fetal. Comunquese con un mdico si:  Tiene clicos leves o siente presin en la parte baja del vientre.  Tiene dolor al hacer pis (orinar).  Advierte un lquido con olor ftido que proviene de la vagina.  Tiene malestar estomacal (nuseas), devuelve (vomita) o tiene deposiciones acuosas (diarrea).  Sufre un dolor persistente en el abdomen.  Siente mareos. Solicite ayuda de inmediato si:  Tiene fiebre.  Tiene una prdida de lquido por la vagina.  Tiene sangrado o pequeas prdidas vaginales.  Siente dolor intenso o clicos en el abdomen.  Sube o baja de peso rpidamente.  Tiene dificultades para recuperar el aliento y siente dolor en el pecho.  Sbitamente se le hinchan mucho el rostro, las manos, los tobillos, los pies o las piernas.  No ha sentido los movimientos del beb durante una hora.  Siente un dolor de cabeza intenso que no se alivia al tomar medicamentos.  Tiene dificultad para ver. Resumen  El segundo trimestre va desde la semana14 hasta la 27, desde el mes 4 hasta el 6. Este suele ser el momento en el que mejor se siente.  Para cuidarse y cuidar a su beb en gestacin, debe comer alimentos saludables, tomar medicamentos solamente si su mdico le indica que lo haga y hacer  actividades que sean seguras para usted y su beb.  Llame al mdico si se enferma o si nota algo inusual acerca de su embarazo. Tambin llame al mdico si necesita ayuda para saber qu alimentos debe comer o si quiere saber qu actividades puede realizar de forma segura. Esta informacin no tiene como fin reemplazar el consejo del mdico. Asegrese de hacerle al mdico cualquier pregunta que tenga. Document Revised: 11/06/2016 Document Reviewed: 11/06/2016 Elsevier Patient Education  2020 Elsevier Inc.  

## 2019-11-25 NOTE — Progress Notes (Signed)
Lower abdominal pain and back pain  Last Pap 2012

## 2019-11-25 NOTE — Progress Notes (Signed)
INITIAL PRENATAL VISIT NOTE  Subjective:  Megan Fuller is a 34 y.o. G1P0 at [redacted]w[redacted]d by early ultrasound being seen today for her initial prenatal visit.   She is being transferred from [facility].  This is a unplanned pregnancy.  She was using nothing for birth control previously. She has an obstetric history significant for nothing since she is a primip. She has a medical history significant for chronic ITP with no hematology care/follow up in several years.  Patient reports no complaints.  Contractions: Not present. Vag. Bleeding: None.  Movement: Present. Denies leaking of fluid.    Past Medical History:  Diagnosis Date  . PCOS (polycystic ovarian syndrome)     History reviewed. No pertinent surgical history.  OB History  Gravida Para Term Preterm AB Living  1            SAB TAB Ectopic Multiple Live Births               # Outcome Date GA Lbr Len/2nd Weight Sex Delivery Anes PTL Lv  1 Current             Social History   Socioeconomic History  . Marital status: Single    Spouse name: Not on file  . Number of children: Not on file  . Years of education: Not on file  . Highest education level: Not on file  Occupational History  . Not on file  Tobacco Use  . Smoking status: Never Smoker  . Smokeless tobacco: Never Used  Substance and Sexual Activity  . Alcohol use: No    Alcohol/week: 0.0 standard drinks  . Drug use: No  . Sexual activity: Yes  Other Topics Concern  . Not on file  Social History Narrative  . Not on file   Social Determinants of Health   Financial Resource Strain:   . Difficulty of Paying Living Expenses: Not on file  Food Insecurity:   . Worried About Programme researcher, broadcasting/film/video in the Last Year: Not on file  . Ran Out of Food in the Last Year: Not on file  Transportation Needs:   . Lack of Transportation (Medical): Not on file  . Lack of Transportation (Non-Medical): Not on file  Physical Activity:   . Days of Exercise per Week: Not on file  .  Minutes of Exercise per Session: Not on file  Stress:   . Feeling of Stress : Not on file  Social Connections:   . Frequency of Communication with Friends and Family: Not on file  . Frequency of Social Gatherings with Friends and Family: Not on file  . Attends Religious Services: Not on file  . Active Member of Clubs or Organizations: Not on file  . Attends Banker Meetings: Not on file  . Marital Status: Not on file    Family History  Problem Relation Age of Onset  . Diabetes Mother      Current Outpatient Medications:  .  Prenat MV-Min-Methylfolate-FA (PRENATE PO), Take by mouth., Disp: , Rfl:   No Known Allergies  Review of Systems: Negative except for what is mentioned in HPI.  Objective:   Vitals:   11/25/19 1415  BP: (!) 124/91  Weight: 177 lb (80.3 kg)    Fetal Status: Fetal Heart Rate (bpm): 135 Fundal Height: 20 cm Movement: Present     Physical Exam: BP (!) 124/91   Wt 177 lb (80.3 kg)   BMI 30.38 kg/m  CONSTITUTIONAL: Well-developed, well-nourished female in  no acute distress.  NEUROLOGIC: Alert and oriented to person, place, and time. Normal reflexes, muscle tone coordination. No cranial nerve deficit noted. PSYCHIATRIC: Normal mood and affect. Normal behavior. Normal judgment and thought content. SKIN: Skin is warm and dry. No rash noted. Not diaphoretic. No erythema. No pallor. HENT:  Normocephalic, atraumatic, External right and left ear normal. Oropharynx is clear and moist EYES: Conjunctivae and EOM are normal. Pupils are equal, round, and reactive to light. No scleral icterus.  NECK: Normal range of motion, supple, no masses CARDIOVASCULAR: Normal heart rate noted, regular rhythm RESPIRATORY: Effort and breath sounds normal, no problems with respiration noted ABDOMEN: Soft, nontender, nondistended, gravid. GU: normal appearing external female genitalia, nulliparous , normal appearing cervix, scant white discharge in vagina, no lesions  noted, pap taken Bimanual: 20 weeks sized uterus, no adnexal tenderness or palpable lesions noted MUSCULOSKELETAL: Normal range of motion. EXT:  No edema and no tenderness. 2+ distal pulses.   Assessment and Plan:  Pregnancy: G1P0 at [redacted]w[redacted]d by ultrasound.  1. Chronic ITP (idiopathic thrombocytopenia) (HCC) Last platelets 75 - Ambulatory referral to Hematology  2. Supervision of high risk pregnancy, antepartum  - [place] MFM OB DETAIL +14 WK; Future - Culture, OB Urine - Cytology - PAP - AFP, Serum, Open Spina Bifida   Preterm labor symptoms and general obstetric precautions including but not limited to vaginal bleeding, contractions, leaking of fluid and fetal movement were reviewed in detail with the patient.  Please refer to After Visit Summary for other counseling recommendations.   Return in about 3 weeks (around 12/16/2019) for [facility], in person.  Warden Fillers 11/25/2019 5:37 PM

## 2019-11-26 LAB — CBC/D/PLT+RPR+RH+ABO+RUB AB...
Antibody Screen: NEGATIVE
Basophils Absolute: 0 10*3/uL (ref 0.0–0.2)
Basos: 0 %
EOS (ABSOLUTE): 0.1 10*3/uL (ref 0.0–0.4)
Eos: 1 %
HCV Ab: 0.1 s/co ratio (ref 0.0–0.9)
HIV Screen 4th Generation wRfx: NONREACTIVE
Hematocrit: 31.5 % — ABNORMAL LOW (ref 34.0–46.6)
Hemoglobin: 11 g/dL — ABNORMAL LOW (ref 11.1–15.9)
Hepatitis B Surface Ag: NEGATIVE
Immature Grans (Abs): 0 10*3/uL (ref 0.0–0.1)
Immature Granulocytes: 0 %
Lymphocytes Absolute: 1 10*3/uL (ref 0.7–3.1)
Lymphs: 14 %
MCH: 34.8 pg — ABNORMAL HIGH (ref 26.6–33.0)
MCHC: 34.9 g/dL (ref 31.5–35.7)
MCV: 100 fL — ABNORMAL HIGH (ref 79–97)
Monocytes Absolute: 0.5 10*3/uL (ref 0.1–0.9)
Monocytes: 7 %
Neutrophils Absolute: 5.4 10*3/uL (ref 1.4–7.0)
Neutrophils: 78 %
Platelets: 66 10*3/uL — CL (ref 150–450)
RBC: 3.16 x10E6/uL — ABNORMAL LOW (ref 3.77–5.28)
RDW: 13.5 % (ref 11.7–15.4)
RPR Ser Ql: NONREACTIVE
Rh Factor: POSITIVE
Rubella Antibodies, IGG: 16 index (ref >0.99)
WBC: 7.1 10*3/uL (ref 3.4–10.8)

## 2019-11-26 LAB — HCV INTERPRETATION

## 2019-11-27 LAB — CULTURE, OB URINE

## 2019-11-27 LAB — URINE CULTURE, OB REFLEX

## 2019-11-29 LAB — CYTOLOGY - PAP
Chlamydia: NEGATIVE
Comment: NEGATIVE
Comment: NEGATIVE
Comment: NORMAL
Diagnosis: NEGATIVE
High risk HPV: NEGATIVE
Neisseria Gonorrhea: NEGATIVE

## 2019-11-30 ENCOUNTER — Telehealth: Payer: Self-pay | Admitting: *Deleted

## 2019-11-30 ENCOUNTER — Telehealth: Payer: Self-pay | Admitting: Hematology

## 2019-11-30 LAB — AFP, SERUM, OPEN SPINA BIFIDA
AFP MoM: 1.9
AFP Value: 104.6 ng/mL
Gest. Age on Collection Date: 20.5 weeks
Maternal Age At EDD: 32.3 yr
OSBR Risk 1 IN: 1010
Test Results:: NEGATIVE
Weight: 177 [lb_av]

## 2019-11-30 NOTE — Telephone Encounter (Signed)
Received a voicemail requesting additional missing information to process AFP. I called back and gave additional information ( patient weight) and she explained results will be released today. Legrand Como

## 2019-11-30 NOTE — Telephone Encounter (Signed)
Received a new hem referral from Dr. Donavan Foil for chronic ITP. Pt has been cld and scheduled to see Dr. Candise Che on 10/26 at 1pm. Letter mailed to the pt.

## 2019-12-03 ENCOUNTER — Telehealth (INDEPENDENT_AMBULATORY_CARE_PROVIDER_SITE_OTHER): Payer: Self-pay | Admitting: Lactation Services

## 2019-12-03 DIAGNOSIS — Z3402 Encounter for supervision of normal first pregnancy, second trimester: Secondary | ICD-10-CM

## 2019-12-03 NOTE — Telephone Encounter (Signed)
Called and scheduled [place] with [facility]. Patient had an Anatomy US on 9/20 with [facility]. Linus Orn is planning to fax the report to the office today.

## 2019-12-03 NOTE — Telephone Encounter (Signed)
-----   Message from Conan Bowens, MD sent at 12/02/2019 12:35 PM EDT ----- Please get scheduled for anatomy US asap thanks.

## 2019-12-14 ENCOUNTER — Encounter (HOSPITAL_COMMUNITY): Payer: Self-pay | Admitting: Family Medicine

## 2019-12-14 ENCOUNTER — Other Ambulatory Visit: Payer: Self-pay

## 2019-12-14 ENCOUNTER — Encounter: Payer: Self-pay | Admitting: Obstetrics and Gynecology

## 2019-12-14 ENCOUNTER — Ambulatory Visit (INDEPENDENT_AMBULATORY_CARE_PROVIDER_SITE_OTHER): Payer: Self-pay | Admitting: Obstetrics and Gynecology

## 2019-12-14 ENCOUNTER — Inpatient Hospital Stay (HOSPITAL_COMMUNITY)
Admission: AD | Admit: 2019-12-14 | Discharge: 2019-12-29 | DRG: 787 | Disposition: A | Discharge status: Home / Self Care | Payer: Medicaid Other | Attending: Obstetrics and Gynecology | Admitting: Obstetrics and Gynecology

## 2019-12-14 ENCOUNTER — Other Ambulatory Visit: Payer: Self-pay | Admitting: Family Medicine

## 2019-12-14 VITALS — BP 138/84 | HR 100 | Wt 181.6 lb

## 2019-12-14 DIAGNOSIS — N390 Urinary tract infection, site not specified: Secondary | ICD-10-CM | POA: Diagnosis present

## 2019-12-14 DIAGNOSIS — F32A Depression, unspecified: Secondary | ICD-10-CM | POA: Diagnosis present

## 2019-12-14 DIAGNOSIS — O42912 Preterm premature rupture of membranes, unspecified as to length of time between rupture and onset of labor, second trimester: Secondary | ICD-10-CM | POA: Diagnosis present

## 2019-12-14 DIAGNOSIS — R441 Visual hallucinations: Secondary | ICD-10-CM | POA: Diagnosis not present

## 2019-12-14 DIAGNOSIS — E282 Polycystic ovarian syndrome: Secondary | ICD-10-CM | POA: Diagnosis present

## 2019-12-14 DIAGNOSIS — R440 Auditory hallucinations: Secondary | ICD-10-CM | POA: Diagnosis not present

## 2019-12-14 DIAGNOSIS — O99893 Other specified diseases and conditions complicating puerperium: Secondary | ICD-10-CM | POA: Diagnosis not present

## 2019-12-14 DIAGNOSIS — F419 Anxiety disorder, unspecified: Secondary | ICD-10-CM | POA: Diagnosis present

## 2019-12-14 DIAGNOSIS — O99211 Obesity complicating pregnancy, first trimester: Secondary | ICD-10-CM

## 2019-12-14 DIAGNOSIS — O2342 Unspecified infection of urinary tract in pregnancy, second trimester: Secondary | ICD-10-CM | POA: Diagnosis present

## 2019-12-14 DIAGNOSIS — O42112 Preterm premature rupture of membranes, onset of labor more than 24 hours following rupture, second trimester: Secondary | ICD-10-CM | POA: Diagnosis not present

## 2019-12-14 DIAGNOSIS — O99344 Other mental disorders complicating childbirth: Secondary | ICD-10-CM | POA: Diagnosis present

## 2019-12-14 DIAGNOSIS — D693 Immune thrombocytopenic purpura: Secondary | ICD-10-CM | POA: Diagnosis present

## 2019-12-14 DIAGNOSIS — O99112 Other diseases of the blood and blood-forming organs and certain disorders involving the immune mechanism complicating pregnancy, second trimester: Secondary | ICD-10-CM | POA: Diagnosis not present

## 2019-12-14 DIAGNOSIS — Z1331 Encounter for screening for depression: Secondary | ICD-10-CM

## 2019-12-14 DIAGNOSIS — Z23 Encounter for immunization: Secondary | ICD-10-CM | POA: Diagnosis not present

## 2019-12-14 DIAGNOSIS — O368120 Decreased fetal movements, second trimester, not applicable or unspecified: Secondary | ICD-10-CM | POA: Diagnosis present

## 2019-12-14 DIAGNOSIS — Z3A25 25 weeks gestation of pregnancy: Secondary | ICD-10-CM

## 2019-12-14 DIAGNOSIS — O9912 Other diseases of the blood and blood-forming organs and certain disorders involving the immune mechanism complicating childbirth: Secondary | ICD-10-CM | POA: Diagnosis present

## 2019-12-14 DIAGNOSIS — O4692 Antepartum hemorrhage, unspecified, second trimester: Secondary | ICD-10-CM | POA: Diagnosis not present

## 2019-12-14 DIAGNOSIS — Z20822 Contact with and (suspected) exposure to covid-19: Secondary | ICD-10-CM | POA: Diagnosis present

## 2019-12-14 DIAGNOSIS — Z3402 Encounter for supervision of normal first pregnancy, second trimester: Secondary | ICD-10-CM

## 2019-12-14 DIAGNOSIS — O4290 Premature rupture of membranes, unspecified as to length of time between rupture and onset of labor, unspecified weeks of gestation: Secondary | ICD-10-CM

## 2019-12-14 DIAGNOSIS — O99342 Other mental disorders complicating pregnancy, second trimester: Secondary | ICD-10-CM | POA: Diagnosis present

## 2019-12-14 DIAGNOSIS — D696 Thrombocytopenia, unspecified: Secondary | ICD-10-CM | POA: Diagnosis not present

## 2019-12-14 DIAGNOSIS — R42 Dizziness and giddiness: Secondary | ICD-10-CM | POA: Diagnosis not present

## 2019-12-14 DIAGNOSIS — O99284 Endocrine, nutritional and metabolic diseases complicating childbirth: Secondary | ICD-10-CM | POA: Diagnosis not present

## 2019-12-14 DIAGNOSIS — Z3A23 23 weeks gestation of pregnancy: Secondary | ICD-10-CM | POA: Diagnosis not present

## 2019-12-14 DIAGNOSIS — O0990 Supervision of high risk pregnancy, unspecified, unspecified trimester: Secondary | ICD-10-CM

## 2019-12-14 DIAGNOSIS — O368190 Decreased fetal movements, unspecified trimester, not applicable or unspecified: Secondary | ICD-10-CM

## 2019-12-14 DIAGNOSIS — Z3A24 24 weeks gestation of pregnancy: Secondary | ICD-10-CM

## 2019-12-14 LAB — COMPREHENSIVE METABOLIC PANEL
ALT: 14 U/L (ref 0–44)
AST: 38 U/L (ref 15–41)
Albumin: 2.5 g/dL — ABNORMAL LOW (ref 3.5–5.0)
Alkaline Phosphatase: 82 U/L (ref 38–126)
Anion gap: 8 (ref 5–15)
BUN: 6 mg/dL (ref 6–20)
CO2: 23 mmol/L (ref 22–32)
Calcium: 8.8 mg/dL — ABNORMAL LOW (ref 8.9–10.3)
Chloride: 103 mmol/L (ref 98–111)
Creatinine, Ser: 0.58 mg/dL (ref 0.44–1.00)
GFR, Estimated: >60 mL/min (ref >60)
Glucose, Bld: 76 mg/dL (ref 70–99)
Potassium: 3.9 mmol/L (ref 3.5–5.1)
Sodium: 134 mmol/L — ABNORMAL LOW (ref 135–145)
Total Bilirubin: 1.6 mg/dL — ABNORMAL HIGH (ref 0.3–1.2)
Total Protein: 5.7 g/dL — ABNORMAL LOW (ref 6.5–8.1)

## 2019-12-14 LAB — CBC
HCT: 31.2 % — ABNORMAL LOW (ref 36.0–46.0)
Hemoglobin: 10.4 g/dL — ABNORMAL LOW (ref 12.0–15.0)
MCH: 34.6 pg — ABNORMAL HIGH (ref 26.0–34.0)
MCHC: 33.3 g/dL (ref 30.0–36.0)
MCV: 103.7 fL — ABNORMAL HIGH (ref 80.0–100.0)
Platelets: 61 10*3/uL — ABNORMAL LOW (ref 150–400)
RBC: 3.01 MIL/uL — ABNORMAL LOW (ref 3.87–5.11)
RDW: 13.6 % (ref 11.5–15.5)
WBC: 8.9 10*3/uL (ref 4.0–10.5)
nRBC: 0 % (ref 0.0–0.2)

## 2019-12-14 LAB — PROTEIN / CREATININE RATIO, URINE
Creatinine, Urine: 62.39 mg/dL
Protein Creatinine Ratio: 0.11 mg/mg{Cre} (ref 0.00–0.15)
Total Protein, Urine: 7 mg/dL

## 2019-12-14 LAB — PROTIME-INR
INR: 1.2 (ref 0.8–1.2)
Prothrombin Time: 14.6 seconds (ref 11.4–15.2)

## 2019-12-14 LAB — TYPE AND SCREEN
ABO/RH(D): O POS
Antibody Screen: NEGATIVE

## 2019-12-14 LAB — APTT: aPTT: 30 seconds (ref 24–36)

## 2019-12-14 LAB — AMNISURE RUPTURE OF MEMBRANE (ROM) NOT AT ARMC: Amnisure ROM: NEGATIVE

## 2019-12-14 LAB — RESPIRATORY PANEL BY RT PCR (FLU A&B, COVID)
Influenza A by PCR: NEGATIVE
Influenza B by PCR: NEGATIVE
SARS Coronavirus 2 by RT PCR: NEGATIVE

## 2019-12-14 LAB — FIBRINOGEN: Fibrinogen: 347 mg/dL (ref 210–475)

## 2019-12-14 MED ORDER — ACETAMINOPHEN 325 MG PO TABS
650.0000 mg | ORAL_TABLET | ORAL | Status: DC | PRN
  Administered 2019-12-14: 650 mg via ORAL
  Filled 2019-12-14: qty 2

## 2019-12-14 MED ORDER — PENICILLIN G POT IN DEXTROSE 60000 UNIT/ML IV SOLN: 3.0000 10*6.[IU] | INTRAVENOUS | Status: DC

## 2019-12-14 MED ORDER — ONDANSETRON HCL 4 MG/2ML IJ SOLN: 4.0000 mg | Freq: Four times a day (QID) | INTRAMUSCULAR | Status: DC | PRN

## 2019-12-14 MED ORDER — MAGNESIUM SULFATE 40 GM/1000ML IV SOLN
2.0000 g/h | INTRAVENOUS | Status: AC
  Administered 2019-12-15: 2 g/h via INTRAVENOUS
  Filled 2019-12-14 (×2): qty 1000

## 2019-12-14 MED ORDER — NIFEDIPINE 10 MG PO CAPS
10.0000 mg | ORAL_CAPSULE | ORAL | Status: AC
  Administered 2019-12-14 (×3): 10 mg via ORAL
  Filled 2019-12-14: qty 1

## 2019-12-14 MED ORDER — SODIUM CHLORIDE 0.9 % IV SOLN: 5.0000 10*6.[IU] | Freq: Once | INTRAVENOUS | Status: DC

## 2019-12-14 MED ORDER — SOD CITRATE-CITRIC ACID 500-334 MG/5ML PO SOLN
30.0000 mL | ORAL | Status: DC | PRN
  Filled 2019-12-14: qty 15

## 2019-12-14 MED ORDER — MAGNESIUM SULFATE BOLUS VIA INFUSION
4.0000 g | Freq: Once | INTRAVENOUS | Status: AC
  Administered 2019-12-14: 4 g via INTRAVENOUS
  Filled 2019-12-14: qty 1000

## 2019-12-14 MED ORDER — INDOMETHACIN 25 MG PO CAPS
25.0000 mg | ORAL_CAPSULE | Freq: Four times a day (QID) | ORAL | Status: AC
  Administered 2019-12-15 – 2019-12-17 (×11): 25 mg via ORAL
  Filled 2019-12-14 (×13): qty 1

## 2019-12-14 MED ORDER — OXYTOCIN-SODIUM CHLORIDE 30-0.9 UT/500ML-% IV SOLN: 2.5000 [IU]/h | INTRAVENOUS | Status: DC

## 2019-12-14 MED ORDER — LACTATED RINGERS IV SOLN: INTRAVENOUS | Status: DC

## 2019-12-14 MED ORDER — SODIUM CHLORIDE 0.9 % IV SOLN
2.0000 g | Freq: Four times a day (QID) | INTRAVENOUS | Status: DC
  Administered 2019-12-14 (×2): 2 g via INTRAVENOUS
  Filled 2019-12-14 (×2): qty 2000

## 2019-12-14 MED ORDER — PENICILLIN G POT IN DEXTROSE 60000 UNIT/ML IV SOLN
3.0000 10*6.[IU] | INTRAVENOUS | Status: DC
  Administered 2019-12-15: 3 10*6.[IU] via INTRAVENOUS
  Filled 2019-12-14: qty 50

## 2019-12-14 MED ORDER — OXYTOCIN BOLUS FROM INFUSION: 333.0000 mL | Freq: Once | INTRAVENOUS | Status: DC

## 2019-12-14 MED ORDER — BETAMETHASONE SOD PHOS & ACET 6 (3-3) MG/ML IJ SUSP
12.0000 mg | INTRAMUSCULAR | Status: AC
  Administered 2019-12-14 – 2019-12-15 (×2): 12 mg via INTRAMUSCULAR
  Filled 2019-12-14: qty 5

## 2019-12-14 MED ORDER — INDOMETHACIN 25 MG PO CAPS
50.0000 mg | ORAL_CAPSULE | Freq: Once | ORAL | Status: AC
  Administered 2019-12-14: 50 mg via ORAL
  Filled 2019-12-14: qty 2

## 2019-12-14 MED ORDER — OXYCODONE-ACETAMINOPHEN 5-325 MG PO TABS: 1.0000 | ORAL_TABLET | ORAL | Status: DC | PRN

## 2019-12-14 MED ORDER — INDOMETHACIN 25 MG PO CAPS
25.0000 mg | ORAL_CAPSULE | Freq: Four times a day (QID) | ORAL | Status: DC
  Filled 2019-12-14 (×2): qty 1

## 2019-12-14 MED ORDER — LACTATED RINGERS IV SOLN: 500.0000 mL | INTRAVENOUS | Status: DC | PRN

## 2019-12-14 MED ORDER — OXYCODONE-ACETAMINOPHEN 5-325 MG PO TABS: 2.0000 | ORAL_TABLET | ORAL | Status: DC | PRN

## 2019-12-14 MED ORDER — LIDOCAINE HCL (PF) 1 % IJ SOLN: 30.0000 mL | INTRAMUSCULAR | Status: DC | PRN

## 2019-12-14 NOTE — Progress Notes (Signed)
Spanish Interpreter Nohella T.  

## 2019-12-14 NOTE — Progress Notes (Signed)
   PRENATAL VISIT NOTE  Subjective:  Megan Fuller is a 34 y.o. G1P0 at [redacted]w[redacted]d being seen today for ongoing prenatal care.  She is currently monitored for the following issues for this high-risk pregnancy and has PCOS (polycystic ovarian syndrome); UTI in pregnancy, antepartum, second trimester; Chronic ITP (idiopathic thrombocytopenia) (HCC); History of vaginal bleeding; Depression affecting pregnancy in second trimester, antepartum; Encounter for supervision of normal first pregnancy in second trimester; and Supervision of high risk pregnancy, antepartum on their problem list.  Patient reports leakage of fluid.  Contractions: Not present. Vag. Bleeding: None.  Movement: Present. Denies leaking of fluid.   The following portions of the patient's history were reviewed and updated as appropriate: allergies, current medications, past family history, past medical history, past social history, past surgical history and problem list.   Objective:   Vitals:   12/14/19 1019  BP: 138/84  Pulse: 100  Weight: 181 lb 9.6 oz (82.4 kg)    Fetal Status: Fetal Heart Rate (bpm): 150 Fundal Height: 23 cm Movement: Present     General:  Alert, oriented and cooperative. Patient is in no acute distress.  Skin: Skin is warm and dry. No rash noted.   Cardiovascular: Normal heart rate noted  Respiratory: Normal respiratory effort, no problems with respiration noted  Abdomen: Soft, gravid, appropriate for gestational age.  Pain/Pressure: Present     Pelvic: Cervical exam performed in the presence of a chaperone: visually 1 cm with bulging mebranes visualized a the os.         Extremities: Normal range of motion.  Edema: None  Mental Status: Normal mood and affect. Normal behavior. Normal judgment and thought content.   Assessment and Plan:  Pregnancy: G1P0 at [redacted]w[redacted]d 1. Positive depression screening - Patient admits to depression. She agrees to starting antidepressant while hospitalized - Ambulatory  referral to Integrated Behavioral Health  2. Encounter for supervision of normal first pregnancy in second trimester Discussed finding of speculum exam and need for admission   Preterm labor symptoms and general obstetric precautions including but not limited to vaginal bleeding, contractions, leaking of fluid and fetal movement were reviewed in detail with the patient. Please refer to After Visit Summary for other counseling recommendations.   Return in about 3 weeks (around 01/04/2020) for in person, ROB, 2 hr glucola next visit.  Future Appointments  Date Time Provider Department Center  12/21/2019  1:00 PM Johney Maine, MD [facility] None    Catalina Antigua, MD

## 2019-12-14 NOTE — H&P (Addendum)
OBSTETRIC ADMISSION HISTORY AND PHYSICAL  Megan Fuller is a 34 y.o. female G1P0 with IUP at [redacted]w[redacted]d by  presenting for concern of preterm labor. Specifically, pt was seen in clinic earlier today, at which time she reported leakage of fluid. On speculum exam in clinic she was noted to be 1cm dilated with bulging membranes at the os. Given exam findings she was direct admitted to L&D for further observation.  She reports +FMs, no VB, no blurry vision, headaches or peripheral edema, and RUQ pain.  She plans on breastfeeding. She is undecided for birth control.  She received her prenatal [facility]   Dating: By [redacted]w[redacted]d ultrasound --->  Estimated Date of Delivery: 04/08/20  Sono:  @[redacted]w[redacted]d , CWD, normal anatomy, cephalic presentation, 262g, 24.1% EFW  Prenatal History/Complications:   Past Medical History: Past Medical History:  Diagnosis Date  . PCOS (polycystic ovarian syndrome)     Past Surgical History: History reviewed. No pertinent surgical history.  Obstetrical History: OB History    Gravida  1   Para      Term      Preterm      AB      Living        SAB      TAB      Ectopic      Multiple      Live Births              Social History Social History   Socioeconomic History  . Marital status: Single    Spouse name: Not on file  . Number of children: Not on file  . Years of education: Not on file  . Highest education level: Not on file  Occupational History  . Not on file  Tobacco Use  . Smoking status: Never Smoker  . Smokeless tobacco: Never Used  Substance and Sexual Activity  . Alcohol use: No    Alcohol/week: 0.0 standard drinks  . Drug use: No  . Sexual activity: Yes  Other Topics Concern  . Not on file  Social History Narrative  . Not on file   Social Determinants of Health   Financial Resource Strain:   . Difficulty of Paying Living Expenses: Not on file  Food Insecurity:   . Worried About in the Last Year: Not  on file  . Ran Out of Food in the Last Year: Not on file  Transportation Needs:   . Lack of Transportation (Medical): Not on file  . Lack of Transportation (Non-Medical): Not on file  Physical Activity:   . Days of Exercise per Week: Not on file  . Minutes of Exercise per Session: Not on file  Stress:   . Feeling of Stress : Not on file  Social Connections:   . Frequency of Communication with Friends and Family: Not on file  . Frequency of Social Gatherings with Friends and Family: Not on file  . Attends Religious Services: Not on file  . Active Member of Clubs or Organizations: Not on file  . Attends Programme researcher, broadcasting/film/video Meetings: Not on file  . Marital Status: Not on file    Family History: Family History  Problem Relation Age of Onset  . Diabetes Mother     Allergies: No Known Allergies  Medications Prior to Admission  Medication Sig Dispense Refill Last Dose  . Prenat MV-Min-Methylfolate-FA (PRENATE PO) Take by mouth.        Review of Systems   All  systems reviewed and negative except as stated in HPI  Pulse (!) 18, temperature 98.3 F (36.8 C), temperature source Oral, height 5\' 4"  (1.626 m), weight 82.4 kg. General appearance: alert, cooperative and appears stated age Lungs: normal WOB Heart: regular rate Abdomen: soft, non-tender Extremities: no sign of DVT Presentation: cephalic Fetal monitoringBaseline: 145 bpm, Variability: Good {> 6 bpm), Accelerations: Non-reactive but appropriate for gestational age and Decelerations: Absent Uterine activityFrequency: Every 3-4 minutes   Prenatal labs: ABO, Rh: O/Positive/-- (09/30 1537) Antibody: Negative (09/30 1537) Rubella: 16.00 (09/30 1537) RPR: Non Reactive (09/30 1537)  HBsAg: Negative (09/30 1537)  HIV: Non Reactive (09/30 1537)  GBS:   unknown (collected on admission) 1 hr Glucola: not yet performed Genetic screening  declined Anatomy 06-27-2005: wnl at [redacted]w[redacted]d  Prenatal Transfer Tool  Maternal Diabetes:  No Genetic Screening: Declined Maternal Ultrasounds/Referrals: Normal Fetal Ultrasounds or other Referrals:  None Maternal Substance Abuse:  No Significant Maternal Medications:  None Significant Maternal Lab Results: Other: GBS unknown  No results found for this or any previous visit (from the past 24 hour(s)).  Patient Active Problem List   Diagnosis Date Noted  . Supervision of high-risk pregnancy 12/14/2019  . Encounter for supervision of normal first pregnancy in second trimester 11/25/2019  . Supervision of high risk pregnancy, antepartum 11/25/2019  . UTI in pregnancy, antepartum, second trimester 10/27/2019  . Chronic ITP (idiopathic thrombocytopenia) (HCC) 10/27/2019  . History of vaginal bleeding 10/27/2019  . 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 PCOS (polycystic ovarian syndrome) 02/10/2014    Assessment/Plan:  Megan Fuller is a 34 y.o. G1P0 at [redacted]w[redacted]d here for preterm labor.  #Preterm Labor: Pt reports onset of contractions on 10/16. Pt reported leakage of fluid in clinic today with speculum exam notable for 1cm cervical dilation and bulging membranes visible at the os. However, no evidence of ROM given negative for pooling in clinic and on admission with negative amnisure test. Pt making cervical change, now progressed to 2-3cm.  - IV ampicillin 2g q6hr given GBS unknown - BMZ x2 over 24hrs for fetal lung maturity (first dose on admission) - Mg for fetal neuroprotection & tocolysis; will advance to procardia, indomethacin if Mg not effective - NICU consulted with plan to see pt today given cervical change - f/u GBS swab, UCx, GC/Chlamydia results - limit cervical exams  #Chronic ITP: no recent bleeding or bruising. F/u CBC & coags (ordered on admission) #Depression: positive depression screen in clinic today. Pt reported interest in starting a SSRI while inpatient per clinic records. #Pain: TBD #FWB: Category 1 strip #ID:  GBS unknown; ampicillin on admission #MOF: breast #MOC: undecided #Circ: desired  11/16, MD  12/14/2019, 1:21 PM

## 2019-12-14 NOTE — Consult Note (Signed)
[facility]  PRENATAL CONSULT NOTE  History  I was asked by Dr. Denyce Robert team to consult on this patient for possible preterm delivery.  I had the pleasure of meeting with Ms. Blitzer and her sister today.  Ms. Mowbray indicates that the infant's father is not involved.  The visit was conducted through an in-person Spanish interpretor in the presence of the patient's nurse.    Mrs. Morash is a G1 who presented to clinic today in preterm labor at 23+[redacted] weeks gestation with a boy to be named Sue Lush. Pregnancy has been complicated by chronic ITP.  Platelets on admission are 61K.  Membranes are still intact, and she has been given one dose of betamethasone and started on antibiotics and magnesium.    Counseling We discussed the poor overall survival rate at this gestational age, and the even lower survival rate without significant neurologic impairment.  We discussed the expected hospital length of stay and the significant problems associated with extreme prematurity including respiratory distress syndrome/CLD, IVH/PVL, ROP, NEC, and infection risk. I explained that should they choose critical care interventions, he would be intubated immediately in the delivery room.  She understands that despite maximum critical care interventions, her son may encounter complications that he may not be able to survive.  She also understands that if he does survive, he may have life-long medical complexities such as blindness, cerebral palsy, and significant developmental delay.  Given the above information, I explained that there is joint medical decision making for infant's born <[redacted] weeks gestation and that there are three options should she delivery prior to 25 weeks.    1. Provide maximum critical care intervention, utilizing every measure possible to save their baby's life.   2. Do not initiate critical care, and instead provide comfort care until the baby passes, allowing  parents to hold if they desire.   3. Initiate intensive care and see how Sue Lush responds, being open to the idea of with-drawling care should significant complications arise. I explained that here is no right or wrong decision here, that each family has their own set of values and definitions of quality of life which make this a very personal choice.    Should she stay pregnant beyond 25 weeks, the chances of survival without significant impairment are significantly better and intensive care interventions would automatically be initiated by the medical team.  Plan At the end of our conversation, Ms. Pineda-Lovo expressed that she would like [place] to initiate critical care measures in every attempt to save Andrea's life.   I acknowledged that the NICU team would be present at delivery, and we briefly discussed the possibility that despite or best efforts, Sue Lush may not survive out of the delivery room.  Ms. Weber was told that this was not a binding decision and that any time prior to delivery she could change her mind.  She was encouraged to reach out with any more questions or concerns.   Thank you for involving [place] in the care of this patient. A member of our team will be available should the family have additional questions.  Time for consultation was approximately >40 minutes, of which more than half was face-to-face.   _____________________ Electronically Signed By: Karie Schwalbe, MD, MS Neonatologist

## 2019-12-14 NOTE — Progress Notes (Signed)
Labor Progress Note Megan Fuller is a 34 y.o. G1P0 at 101w3d presented for PTL, amnisure neg (10/19).  S: Doing well without complaints. Patient's sister at bedside. Patient denies pelvic pain/pressure/contractions.  O:  BP 124/68   Pulse (!) 116   Temp 98.3 F (36.8 C) (Oral)   Resp 17   Ht 5\' 4"  (1.626 m)   Wt 82.4 kg   SpO2 98%   BMI 31.17 kg/m  EFM: baseline 140bpm/mod variability/+ accels/ occasional variable decel, quick return to baseline Toco: quiet  CVE: Dilation: 2.5 Presentation: Vertex Exam by:: Stinson   A&P: 34 y.o. G1P0 [redacted]w[redacted]d presented for PTL. #PTL: S/p procardia 10 mg x3. Received BMZ x1 10/19 @1352 . S/p NICU consult. Currently on Magnesium. Indomethacin x72 hours. Continue currently management, will defer cervical exam unless patient is feeling pain/pressure. Urine culture and gcc pending.  #Pain: PRN #FWB: cat 1 #GBS not done, PCR pending #Depression: positive depression screen in clinic 10/19 prior to presentation. Continue to monitor. 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 Currently asymptomatic. #UTI in pregnancy: E coli UTI 10/20/19 with initial prenatal labs, TOC neg 9/30. Will repeat urine culture given preterm labor.   10/22/19, MD 9:19 PM

## 2019-12-15 DIAGNOSIS — D693 Immune thrombocytopenic purpura: Secondary | ICD-10-CM

## 2019-12-15 DIAGNOSIS — F32A Depression, unspecified: Secondary | ICD-10-CM

## 2019-12-15 DIAGNOSIS — Z3A24 24 weeks gestation of pregnancy: Secondary | ICD-10-CM

## 2019-12-15 DIAGNOSIS — O99342 Other mental disorders complicating pregnancy, second trimester: Secondary | ICD-10-CM

## 2019-12-15 DIAGNOSIS — Z3A23 23 weeks gestation of pregnancy: Secondary | ICD-10-CM

## 2019-12-15 DIAGNOSIS — O99112 Other diseases of the blood and blood-forming organs and certain disorders involving the immune mechanism complicating pregnancy, second trimester: Secondary | ICD-10-CM

## 2019-12-15 LAB — COMPREHENSIVE METABOLIC PANEL
ALT: 18 U/L (ref 0–44)
AST: 39 U/L (ref 15–41)
Albumin: 2.7 g/dL — ABNORMAL LOW (ref 3.5–5.0)
Alkaline Phosphatase: 94 U/L (ref 38–126)
Anion gap: 9 (ref 5–15)
BUN: 5 mg/dL — ABNORMAL LOW (ref 6–20)
CO2: 19 mmol/L — ABNORMAL LOW (ref 22–32)
Calcium: 7.2 mg/dL — ABNORMAL LOW (ref 8.9–10.3)
Chloride: 104 mmol/L (ref 98–111)
Creatinine, Ser: 0.72 mg/dL (ref 0.44–1.00)
GFR, Estimated: >60 mL/min (ref >60)
Glucose, Bld: 167 mg/dL — ABNORMAL HIGH (ref 70–99)
Potassium: 3.9 mmol/L (ref 3.5–5.1)
Sodium: 132 mmol/L — ABNORMAL LOW (ref 135–145)
Total Bilirubin: 2.4 mg/dL — ABNORMAL HIGH (ref 0.3–1.2)
Total Protein: 6.2 g/dL — ABNORMAL LOW (ref 6.5–8.1)

## 2019-12-15 LAB — CBC
HCT: 30.6 % — ABNORMAL LOW (ref 36.0–46.0)
Hemoglobin: 10.9 g/dL — ABNORMAL LOW (ref 12.0–15.0)
MCH: 35.3 pg — ABNORMAL HIGH (ref 26.0–34.0)
MCHC: 35.6 g/dL (ref 30.0–36.0)
MCV: 99 fL (ref 80.0–100.0)
Platelets: 61 10*3/uL — ABNORMAL LOW (ref 150–400)
RBC: 3.09 MIL/uL — ABNORMAL LOW (ref 3.87–5.11)
RDW: 12.9 % (ref 11.5–15.5)
WBC: 12.8 10*3/uL — ABNORMAL HIGH (ref 4.0–10.5)
nRBC: 0 % (ref 0.0–0.2)

## 2019-12-15 LAB — RPR: RPR Ser Ql: NONREACTIVE

## 2019-12-15 LAB — CERVICOVAGINAL ANCILLARY ONLY
Chlamydia: NEGATIVE
Comment: NEGATIVE
Comment: NORMAL
Neisseria Gonorrhea: NEGATIVE

## 2019-12-15 LAB — MAGNESIUM: Magnesium: 4.9 mg/dL — ABNORMAL HIGH (ref 1.7–2.4)

## 2019-12-15 MED ORDER — ALUM & MAG HYDROXIDE-SIMETH 200-200-20 MG/5ML PO SUSP
30.0000 mL | Freq: Four times a day (QID) | ORAL | Status: DC | PRN
  Administered 2019-12-15: 30 mL via ORAL
  Filled 2019-12-15: qty 30

## 2019-12-15 MED ORDER — SERTRALINE HCL 50 MG PO TABS
50.0000 mg | ORAL_TABLET | Freq: Every day | ORAL | Status: DC
  Administered 2019-12-15: 50 mg via ORAL
  Filled 2019-12-15 (×3): qty 1

## 2019-12-15 MED ORDER — HYDROXYZINE HCL 50 MG PO TABS
25.0000 mg | ORAL_TABLET | Freq: Three times a day (TID) | ORAL | Status: DC | PRN
  Administered 2019-12-15: 25 mg via ORAL
  Filled 2019-12-15: qty 1

## 2019-12-15 MED ORDER — PANTOPRAZOLE SODIUM 20 MG PO TBEC
20.0000 mg | DELAYED_RELEASE_TABLET | Freq: Every day | ORAL | Status: DC
  Administered 2019-12-15 – 2019-12-26 (×11): 20 mg via ORAL
  Filled 2019-12-15 (×12): qty 1

## 2019-12-15 NOTE — Progress Notes (Signed)
Labor Progress Note Megan Fuller is a 34 y.o. G1P0 at [redacted]w[redacted]d presented for PTL, amnisure neg (10/19).  S: Patient complaining of difficulty breathing. No associated chest pain or palpitations. Also complaining of headache at this time. Patient states she has had headaches on and off during this pregnancy, this is the same as her usual headaches. Denies any associated anxiety. Denies pelvic pain or pressure. Not feeling contractions frequently.  O:  BP 134/73   Pulse 98   Temp 97.9 F (36.6 C) (Oral)   Resp 18   Ht 5\' 4"  (1.626 m)   Wt 82.4 kg   SpO2 98%   BMI 31.17 kg/m  EFM: baseline 140bpm/mod variability/+ accels/no decels Toco: intermittent  CVE: Dilation: 2.5 Presentation: Vertex Exam by:: Stinson   A&P: 34 y.o. G1P0 [redacted]w[redacted]d presented for PTL. #PTL: S/p procardia 10 mg x3. Received BMZ x1 10/19 @1352 . S/p NICU consult. Currently on Magnesium. Indomethacin x72 hours. Continue current management, will defer cervical exam unless patient is feeling pain/pressure. Urine culture and gcc pending. Given headache and subjective shortness of breath Mg level and CMP obtained at this time. UOP normal. VSS. Will given atarax 25 mg for anxiety. #Pain: PRN #FWB: cat 1 #GBS not done, PCR pending #Depression: positive depression screen in clinic 10/19 prior to presentation. Continue to monitor. Consider med initiation.  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 Currently asymptomatic. #UTI in pregnancy: E coli UTI 10/20/19 with initial prenatal labs, TOC neg 9/30. Will repeat urine culture given preterm labor.   10/22/19, MD 3:49 AM

## 2019-12-15 NOTE — Progress Notes (Addendum)
FACULTY PRACTICE ANTEPARTUM COMPREHENSIVE PROGRESS NOTE  Megan Fuller is a 34 y.o. G1P0 at [redacted]w[redacted]d who is admitted for Preterm labor.  Estimated Date of Delivery: 04/08/20 Fetal presentation is cephalic.  Length of Stay:  1 Days. Admitted 12/14/2019  Subjective: Patient is Spanish-speaking only, interpreter present for this encounter.  She reports no current complaints. Patient reports good fetal movement.  She reports no uterine contractions, no bleeding and no loss of fluid per vagina.  Vitals:  Blood pressure 113/66, pulse 90, temperature 97.9 F (36.6 C), temperature source Oral, resp. rate 20, height 5\' 4"  (1.626 m), weight 82.4 kg, SpO2 98 %. Physical Examination: CONSTITUTIONAL: Well-developed, well-nourished female in no acute distress.  NEUROLGIC: Alert and oriented to person, place, and time. Normal reflexes, muscle tone coordination. No cranial nerve deficit noted. PSYCHIATRIC: Normal mood and affect. Normal behavior. Normal judgment and thought content. CARDIOVASCULAR: Normal heart rate noted, regular rhythm RESPIRATORY: Effort and breath sounds normal, no problems with respiration noted MUSCULOSKELETAL: Normal range of motion. No edema and no tenderness. 2+ distal pulses. ABDOMEN: Soft, nontender, nondistended, gravid. CERVIX: Dilation: 2.5 Presentation: Vertex Exam by:: Stinson  Fetal monitoring: FHR: 130 bpm, Variability: moderate, Accelerations: 10  x10, Decelerations: Absent  Uterine activity: Rare contractions  Results for orders placed or performed during the hospital encounter of 12/14/19 (from the past 48 hour(s))  Type and screen [facility]     Status: None   Collection Time: 12/14/19  1:15 PM  Result Value Ref Range   ABO/RH(D) O POS    Antibody Screen NEG    Sample Expiration      12/17/2019,2359 Performed at [facility], [address], [place], [place]   CBC     Status: Abnormal   Collection Time: 12/14/19  1:27  PM  Result Value Ref Range   WBC 8.9 4.0 - 10.5 K/uL   RBC 3.01 (L) 3.87 - 5.11 MIL/uL   Hemoglobin 10.4 (L) 12.0 - 15.0 g/dL   HCT 12/16/19 (L) 36 - 46 %   MCV 103.7 (H) 80.0 - 100.0 fL   MCH 34.6 (H) 26.0 - 34.0 pg   MCHC 33.3 30.0 - 36.0 g/dL   RDW 59.5 63.8 - 75.6 %   Platelets 61 (L) 150 - 400 K/uL    Comment: REPEATED TO VERIFY SPECIMEN CHECKED FOR CLOTS Immature Platelet Fraction may be clinically indicated, consider ordering this additional test 43.3 CONSISTENT WITH PREVIOUS RESULT    nRBC 0.0 0.0 - 0.2 %    Comment: Performed at [facility], [address], [place], [place]  Fibrinogen (coagulopathy lab panel)     Status: None   Collection Time: 12/14/19  1:27 PM  Result Value Ref Range   Fibrinogen 347 210 - 475 mg/dL    Comment: Performed at [facility], [address] [place], [place]  Protime-INR (coagulopathy lab panel)     Status: None   Collection Time: 12/14/19  1:27 PM  Result Value Ref Range   Prothrombin Time 14.6 11.4 - 15.2 seconds   INR 1.2 0.8 - 1.2    Comment: (NOTE) INR goal varies based on device and disease states. Performed at [facility], [address], [place], [place]   APTT (coagulopathy lab panel)     Status: None   Collection Time: 12/14/19  1:27 PM  Result Value Ref Range   aPTT 30 24 - 36 seconds  Comment: Performed at [facility], [address] [place], [place] [address]  Culture, beta strep (group b only)     Status: None (Preliminary result)   Collection Time: 12/14/19  1:27 PM   Specimen: Vaginal/Rectal; Genital  Result Value Ref Range   Specimen Description VAGINAL/RECTAL    Special Requests NONE    Culture      CULTURE REINCUBATED FOR BETTER GROWTH Performed at [facility], [address], [place], [place] [address]    Report Status PENDING   Comprehensive metabolic panel     Status: Abnormal   Collection Time: 12/14/19  1:27 PM  Result Value Ref  Range   Sodium 134 (L) 135 - 145 mmol/L   Potassium 3.9 3.5 - 5.1 mmol/L   Chloride 103 98 - 111 mmol/L   CO2 23 22 - 32 mmol/L   Glucose, Bld 76 70 - 99 mg/dL    Comment: Glucose reference range applies only to samples taken after fasting for at least 8 hours.   BUN 6 6 - 20 mg/dL   Creatinine, Ser 4.01 0.44 - 1.00 mg/dL   Calcium 8.8 (L) 8.9 - 10.3 mg/dL   Total Protein 5.7 (L) 6.5 - 8.1 g/dL   Albumin 2.5 (L) 3.5 - 5.0 g/dL   AST 38 15 - 41 U/L   ALT 14 0 - 44 U/L   Alkaline Phosphatase 82 38 - 126 U/L   Total Bilirubin 1.6 (H) 0.3 - 1.2 mg/dL   GFR, Estimated >02 >72 mL/min   Anion gap 8 5 - 15    Comment: Performed at [facility], [address], [place], [place] [address]  Amnisure rupture of membrane (rom)not at [facility]     Status: None   Collection Time: 12/14/19  1:50 PM  Result Value Ref Range   Amnisure ROM NEGATIVE     Comment: Performed at [facility], [address] [place], [place] [address]  Respiratory Panel by RT PCR (Flu A&B, Covid) - Nasopharyngeal Swab     Status: None   Collection Time: 12/14/19  1:58 PM   Specimen: Nasopharyngeal Swab  Result Value Ref Range   SARS Coronavirus 2 by RT PCR NEGATIVE NEGATIVE    Comment: (NOTE) SARS-CoV-2 target nucleic acids are NOT DETECTED.  The SARS-CoV-2 RNA is generally detectable in upper respiratoy specimens during the acute phase of infection. The lowest concentration of SARS-CoV-2 viral copies this assay can detect is 131 copies/mL. A negative result does not preclude SARS-Cov-2 infection and should not be used as the sole basis for treatment or other patient management decisions. A negative result may occur with  improper specimen collection/handling, submission of specimen other than nasopharyngeal swab, presence of viral mutation(s) within the areas targeted by this assay, and inadequate number of viral copies (<131 copies/mL). A negative result must be combined with clinical observations, patient  history, and epidemiological information. The expected result is Negative.  Fact Sheet for Patients:  https://www.moore.com/  Fact Sheet for Healthcare Providers:  https://www.young.biz/  This test is no t yet approved or cleared by the [place] FDA and  has been authorized for detection and/or diagnosis of SARS-CoV-2 by FDA under an Emergency Use Authorization (EUA). This EUA will remain  in effect (meaning this test can be used) for the duration of the COVID-19 declaration under Section 564(b)(1) of the Act, 21 U.S.C. section 360bbb-3(b)(1), unless the authorization is terminated or revoked sooner.     Influenza  A by PCR NEGATIVE NEGATIVE   Influenza B by PCR NEGATIVE NEGATIVE    Comment: (NOTE) The Xpert Xpress SARS-CoV-2/FLU/RSV assay is intended as an aid in  the diagnosis of influenza from Nasopharyngeal swab specimens and  should not be used as a sole basis for treatment. Nasal washings and  aspirates are unacceptable for Xpert Xpress SARS-CoV-2/FLU/RSV  testing.  Fact Sheet for Patients: https://www.moore.com/https://www.fda.gov/media/142436/download  Fact Sheet for Healthcare Providers: https://www.young.biz/https://www.fda.gov/media/142435/download  This test is not yet approved or cleared by the [place] FDA and  has been authorized for detection and/or diagnosis of SARS-CoV-2 by  FDA under an Emergency Use Authorization (EUA). This EUA will remain  in effect (meaning this test can be used) for the duration of the  Covid-19 declaration under Section 564(b)(1) of the Act, 21  U.S.C. section 360bbb-3(b)(1), unless the authorization is  terminated or revoked. Performed at [facility], [address] [place], [place] [address]   Protein / creatinine ratio, urine     Status: None   Collection Time: 12/14/19  6:12 PM  Result Value Ref Range   Creatinine, Urine 62.39 mg/dL   Total Protein, Urine 7 mg/dL    Comment: NO NORMAL RANGE ESTABLISHED FOR THIS  TEST   Protein Creatinine Ratio 0.11 0.00 - 0.15 mg/mg[Cre]    Comment: Performed at [facility], [address] [place], [place] [address]  Comprehensive metabolic panel     Status: Abnormal   Collection Time: 12/15/19  3:07 AM  Result Value Ref Range   Sodium 132 (L) 135 - 145 mmol/L   Potassium 3.9 3.5 - 5.1 mmol/L   Chloride 104 98 - 111 mmol/L   CO2 19 (L) 22 - 32 mmol/L   Glucose, Bld 167 (H) 70 - 99 mg/dL    Comment: Glucose reference range applies only to samples taken after fasting for at least 8 hours.   BUN 5 (L) 6 - 20 mg/dL   Creatinine, Ser 0.980.72 0.44 - 1.00 mg/dL   Calcium 7.2 (L) 8.9 - 10.3 mg/dL   Total Protein 6.2 (L) 6.5 - 8.1 g/dL   Albumin 2.7 (L) 3.5 - 5.0 g/dL   AST 39 15 - 41 U/L   ALT 18 0 - 44 U/L   Alkaline Phosphatase 94 38 - 126 U/L   Total Bilirubin 2.4 (H) 0.3 - 1.2 mg/dL   GFR, Estimated >11>60 >91>60 mL/min   Anion gap 9 5 - 15    Comment: Performed at [facility], [address] [place], [place] [address]  Magnesium     Status: Abnormal   Collection Time: 12/15/19  3:07 AM  Result Value Ref Range   Magnesium 4.9 (H) 1.7 - 2.4 mg/dL    Comment: Performed at [facility], [address] [place], [place] [address]  CBC     Status: Abnormal   Collection Time: 12/15/19  3:07 AM  Result Value Ref Range   WBC 12.8 (H) 4.0 - 10.5 K/uL   RBC 3.09 (L) 3.87 - 5.11 MIL/uL   Hemoglobin 10.9 (L) 12.0 - 15.0 g/dL   HCT 08.630.6 (L) 36 - 46 %   MCV 99.0 80.0 - 100.0 fL   MCH 35.3 (H) 26.0 - 34.0 pg   MCHC 35.6 30.0 - 36.0 g/dL   RDW 57.812.9 46.911.5 - 62.915.5 %   Platelets 61 (L) 150 - 400 K/uL    Comment: REPEATED TO VERIFY Immature Platelet Fraction may be clinically indicated,  consider ordering this additional test [id number]    nRBC 0.0 0.0 - 0.2 %    Comment: Performed at [facility], [address] [place], [place] [address]    No results found.  Current scheduled medications . betamethasone acetate-betamethasone sodium phosphate   12 mg Intramuscular Q24 Hr x 2  . indomethacin  25 mg Oral Q6H  . oxytocin 40 units in LR 1000 mL  333 mL Intravenous Once    I have reviewed the patient's current medications.  ASSESSMENT: Principal Problem:   Preterm labor in second trimester Active Problems:   PCOS (polycystic ovarian syndrome)   UTI in pregnancy, antepartum, second trimester   Chronic ITP (idiopathic thrombocytopenia) (HCC)   Depression affecting pregnancy in second trimester, antepartum   Supervision of high-risk pregnancy   [redacted] weeks gestation of pregnancy   PLAN: Continue tocolysis for now until 48 hours after initial betamethasone, may discontinue magnesium sulfate tomorrow. Heme Onc (Dr. Bertis Ruddy) curbsided, she feels current platelet count in 60K is her baseline, no bleeding, no intervention needed for now.  Patient already has outpatient appointment with Heme/Onc next week. Once discharged, it is important she follows up as scheduled. Category 1 FHR tracing for GA, GBS pending.  Appreciate NICU consult. Zoloft 50 mg po daily started for depression Continue close observation and routine antenatal care.   Jaynie Collins, MD, FACOG Obstetrician & Gynecologist, [facility] for [employer], [facility]

## 2019-12-15 NOTE — Progress Notes (Signed)
Labor Progress Note Megan Fuller is a 34 y.o. G1P0 at [redacted]w[redacted]d presented for PTL, amnisure neg (10/19).  S: Patient resting comfortably upon re-evaluation after atarax. Suspect patient's symptoms most likely due to anxiety.   O:  BP 109/72   Pulse 88   Temp 97.9 F (36.6 C) (Oral)   Resp 18   Ht 5\' 4"  (1.626 m)   Wt 82.4 kg   SpO2 91%   BMI 31.17 kg/m  EFM: baseline 140bpm/mod variability/+ accels/no decels Toco: intermittent  CVE: Dilation: 2.5 Presentation: Vertex Exam by:: Stinson   A&P: 34 y.o. G1P0 [redacted]w[redacted]d presented for PTL. #PTL: S/p procardia 10 mg x3. Received BMZ x1 10/19 @1352 . S/p NICU consult. Magnesium previously held given patient's headache and reported shortness of breath, Mg level 4.9, so magnesium restarted. Indomethacin x72 hours. Urine culture and gcc pending. Patient is currently comfortable. Given she has not had pelvic pain/pressure since presentation to L&D discussion with Dr. 11/19, will transfer down to [facility] specialty care and continue to monitor. #Pain: PRN #FWB: cat 1 #GBS not done, PCR pending #Depression: positive depression screen in clinic 10/19 prior to presentation. Continue to monitor. Consider med initiation.  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 Currently asymptomatic. #UTI in pregnancy: E coli UTI 10/20/19 with initial prenatal labs, TOC neg 9/30. Will repeat urine culture given preterm labor.   10/22/19, MD 5:28 AM

## 2019-12-15 NOTE — Progress Notes (Addendum)
Upon RN entering room to adjust monitors, pt reports feeling short of breath and complaining of headache. Pt reports this feeling happens about every 2-3 minutes but this is normal for her and something she has had throughout pregnancy. Pt denies any pain in her abdomen or back like she was having earlier with contractions. She reports she normally just gets up and walks around and her breathing gets better.  Pulse ox applied and pts 02 sat 100%. MD called and mag stopped, mag level ordered and RN will draw and sent to lab.

## 2019-12-15 NOTE — Progress Notes (Signed)
Labor Progress Note Megan Fuller is a 34 y.o. G1P0 at [redacted]w[redacted]d presented for PTL, amnisure neg (10/19).  S: Strip reviewed.  O:  BP 132/65   Pulse (!) 116   Temp 97.9 F (36.6 C) (Oral)   Resp 17   Ht 5\' 4"  (1.626 m)   Wt 82.4 kg   SpO2 98%   BMI 31.17 kg/m  EFM: baseline 140bpm/mod variability/+ accels/no decels Toco: intermittent  CVE: Dilation: 2.5 Presentation: Vertex Exam by:: Stinson   A&P: 34 y.o. G1P0 [redacted]w[redacted]d presented for PTL. #PTL: S/p procardia 10 mg x3. Received BMZ x1 10/19 @1352 . S/p NICU consult. Currently on Magnesium. Indomethacin x72 hours. Continue currently management, will defer cervical exam unless patient is feeling pain/pressure. Urine culture and gcc pending.  #Pain: PRN #FWB: cat 1 #GBS not done, PCR pending #Depression: positive depression screen in clinic 10/19 prior to presentation. Continue to monitor. 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 Currently asymptomatic. #UTI in pregnancy: E coli UTI 10/20/19 with initial prenatal labs, TOC neg 9/30. Will repeat urine culture given preterm labor.   10/22/19, MD 12:53 AM

## 2019-12-16 ENCOUNTER — Inpatient Hospital Stay (HOSPITAL_BASED_OUTPATIENT_CLINIC_OR_DEPARTMENT_OTHER): Payer: Medicaid Other

## 2019-12-16 DIAGNOSIS — O42112 Preterm premature rupture of membranes, onset of labor more than 24 hours following rupture, second trimester: Secondary | ICD-10-CM | POA: Diagnosis not present

## 2019-12-16 DIAGNOSIS — O42912 Preterm premature rupture of membranes, unspecified as to length of time between rupture and onset of labor, second trimester: Secondary | ICD-10-CM

## 2019-12-16 DIAGNOSIS — Z3A23 23 weeks gestation of pregnancy: Secondary | ICD-10-CM

## 2019-12-16 LAB — CULTURE, BETA STREP (GROUP B ONLY)

## 2019-12-16 LAB — CULTURE, OB URINE: Culture: NO GROWTH

## 2019-12-16 LAB — AMNISURE RUPTURE OF MEMBRANE (ROM) NOT AT ARMC: Amnisure ROM: POSITIVE

## 2019-12-16 MED ORDER — NIFEDIPINE ER OSMOTIC RELEASE 30 MG PO TB24
30.0000 mg | ORAL_TABLET | Freq: Two times a day (BID) | ORAL | Status: DC
  Administered 2019-12-16 – 2019-12-25 (×19): 30 mg via ORAL
  Filled 2019-12-16 (×19): qty 1

## 2019-12-16 MED ORDER — DOCUSATE SODIUM 100 MG PO CAPS
100.0000 mg | ORAL_CAPSULE | Freq: Every day | ORAL | Status: DC
  Administered 2019-12-16 – 2019-12-26 (×10): 100 mg via ORAL
  Filled 2019-12-16 (×11): qty 1

## 2019-12-16 MED ORDER — SODIUM CHLORIDE 0.9 % IV SOLN
2.0000 g | Freq: Four times a day (QID) | INTRAVENOUS | Status: AC
  Administered 2019-12-16 – 2019-12-17 (×8): 2 g via INTRAVENOUS
  Filled 2019-12-16 (×8): qty 2000

## 2019-12-16 MED ORDER — PRENATAL MULTIVITAMIN CH
1.0000 | ORAL_TABLET | Freq: Every day | ORAL | Status: DC
  Administered 2019-12-16 – 2019-12-26 (×10): 1 via ORAL
  Filled 2019-12-16 (×10): qty 1

## 2019-12-16 MED ORDER — CALCIUM CARBONATE ANTACID 500 MG PO CHEW: 2.0000 | CHEWABLE_TABLET | ORAL | Status: DC | PRN

## 2019-12-16 MED ORDER — AZITHROMYCIN 250 MG PO TABS
1000.0000 mg | ORAL_TABLET | Freq: Once | ORAL | Status: AC
  Administered 2019-12-16: 1000 mg via ORAL
  Filled 2019-12-16: qty 4

## 2019-12-16 MED ORDER — ZOLPIDEM TARTRATE 5 MG PO TABS: 5.0000 mg | ORAL_TABLET | Freq: Every evening | ORAL | Status: DC | PRN

## 2019-12-16 MED ORDER — AMOXICILLIN 500 MG PO CAPS
500.0000 mg | ORAL_CAPSULE | Freq: Three times a day (TID) | ORAL | Status: AC
  Administered 2019-12-18 – 2019-12-22 (×15): 500 mg via ORAL
  Filled 2019-12-16 (×15): qty 1

## 2019-12-16 NOTE — Progress Notes (Signed)
Subjective: Late entry, pt originally seen at 1730.  CTSP pt regarding increase uterine activity and pelvic pressure.  Pt seen with interpreter and nursing staff.  Per nursing pt felt increased pelvic pressure when using the rest room and was starting to have contractions q 3 minutes which she was breathing through.  Pt noted PROM earlier today.  Pt asked directly were the contractions strong and she answered no, they were light but about every three minutes.  She denies any new change in pelvic pressure.  Pt is s/p magnesium sulfate for neuroprotection and betamethasone.  She is currently on latency antibiotics and twice daily procardia for contractions.  She also receives intermittent indomethicin. Objective: Vital signs in last 24 hours: Temp:  [98 F (36.7 C)-98.8 F (37.1 C)] 98.2 F (36.8 C) (10/21 1952) Pulse Rate:  [89-104] 104 (10/21 1952) Resp:  [18] 18 (10/21 1606) BP: (105-117)/(68-76) 117/68 (10/21 1952) SpO2:  [99 %-100 %] 100 % (10/21 1952) Weight change:   Intake/Output from previous day: 10/20 0701 - 10/21 0700 In: 3955.5 [P.O.:1825; I.V.:2130.5] Out: 5100 [Urine:5100] Intake/Output this shift: Total I/O In: 720 [P.O.:720] Out: -   General appearance: alert, cooperative and no distress GI: soft, nontender Pelvic: deferred  Bedside ultrasound reconfirmed cephalic presentation  FHT: 150s with variability appropriate to 23 weeks Irregular contractions q 3-4 minutes Lab Results: Recent Labs    12/14/19 1327 12/15/19 0307  WBC 8.9 12.8*  HGB 10.4* 10.9*  HCT 31.2* 30.6*  PLT 61* 61*   BMET:  Recent Labs    12/14/19 1327 12/15/19 0307  NA 134* 132*  K 3.9 3.9  CL 103 104  CO2 23 19*  GLUCOSE 76 167*  BUN 6 5*  CREATININE 0.58 0.72  CALCIUM 8.8* 7.2*   No results for input(s): PTH in the last 72 hours. Iron Studies: No results for input(s): IRON, TIBC, TRANSFERRIN, FERRITIN in the last 72 hours. Studies/Results: [place] MFM OB DETAIL +14 WK  Result  Date: 12/16/2019 ----------------------------------------------------------------------  OBSTETRICS REPORT                       (Signed Final 12/16/2019 04:31 pm) ---------------------------------------------------------------------- Patient Info  ID #:       [id number]                          D.O.B.:  Jan 17, 1986 (34 yrs)  Name:       Megan Fuller              Visit Date: 12/16/2019 09:37 am ---------------------------------------------------------------------- Performed By  Attending:        Lin Landsman      Referred By:      [facility]                    MD                                       Care  Performed By:     Lenise Arena        [facility] ---------------------------------------------------------------------- Orders  #  Description                           Code        Ordered By  1  [place] MFM OB DETAIL +14 WK               76811.01    Scheryl Darter ----------------------------------------------------------------------  #  Order #                     Accession #                Episode #  1  [id number]                   [id number]                 [id number] ---------------------------------------------------------------------- Indications  Premature rupture of membranes - leaking       O42.90  fluid  Encounter for uncertain dates                  Z36.87  Medical complication of pregnancy (Adult       O26.90  chronic immune thrombocytopenic purpura)  Obesity complicating pregnancy, second         O99.212  trimester (BMI 31)  [redacted] weeks gestation of pregnancy                Z3A.23 ---------------------------------------------------------------------- Vital Signs  Weight (lb): 181                               Height:        5'4"  BMI:         31.07 ---------------------------------------------------------------------- Fetal Evaluation  Num Of Fetuses:         1  Fetal Heart Rate(bpm):  150  Cardiac  Activity:       Observed  Presentation:           Cephalic  Placenta:               Anterior  P. Cord Insertion:      Visualized, central  Amniotic Fluid  AFI FV:      Oligohydramnios                              Largest Pocket(cm)                              3.3 ---------------------------------------------------------------------- Biometry  BPD:      55.7  mm     G. Age:  23w 0d         19  %    CI:        70.84   %    70 - 86                                                          FL/HC:      18.8   %    18.7 - 20.9  HC:      210.9  mm  G. Age:  23w 1d         15  %    HC/AC:      1.07        1.05 - 1.21  AC:      197.7  mm     G. Age:  24w 3d         65  %    FL/BPD:     71.1   %    71 - 87  FL:       39.6  mm     G. Age:  22w 5d         13  %    FL/AC:      20.0   %    20 - 24  CER:      25.3  mm     G. Age:  23w 0d         48  %  CM:        5.9  mm  Est. FW:     612  gm      1 lb 6 oz     37  % ---------------------------------------------------------------------- OB History  Gravidity:    1 ---------------------------------------------------------------------- Gestational Age  U/S Today:     23w 2d                                        EDD:   04/11/20  Best:          23w 5d     Det. ByMarcella Dubs:  Early Ultrasound         EDD:   04/08/20                                      (08/10/19) ---------------------------------------------------------------------- Anatomy  Cranium:               Not well visualized    Aortic Arch:            Not well visualized  Cavum:                 Not well visualized    Ductal Arch:            Not well visualized  Ventricles:            Not well visualized    Diaphragm:              Not well visualized  Choroid Plexus:        Not well visualized    Stomach:                Absence of fluid                                                                        filled stomach  Cerebellum:            Appears normal         Abdomen:  Appears normal  Posterior Fossa:        Appears normal         Abdominal Wall:         Appears nml (cord                                                                        insert, abd wall)  Nuchal Fold:           Not applicable (>20    Cord Vessels:           Appears normal ([redacted]                         wks GA)                                        vessel cord)  Face:                  Not well visualized    Kidneys:                Appear normal  Lips:                  Not well visualized    Bladder:                Appears normal  Thoracic:              Appears normal         Spine:                  Ltd views no                                                                        intracranial signs of                                                                        NTD  Heart:                 Not well visualized    Upper Extremities:      Appears normal  RVOT:                  Appears normal         Lower Extremities:      Appears normal  LVOT:                  Not well visualized  Other:  Technically difficult due to low amniotic fluid and fetal position. ---------------------------------------------------------------------- Cervix Uterus Adnexa  Cervix  Not visualized (advanced GA >24wks) ---------------------------------------------------------------------- Impression  Single intrauterine pregnancy here for a detailed anatomy  with an inpatient admission due to premature rupture of  membranes.  Normal anatomy with measurements consistent with dates  There is good fetal movement and with oligohydramnios.  Suboptimal views of the fetal anatomy were obtained  secondary to fetal position and decreased amniotic fluid  volume. ---------------------------------------------------------------------- Recommendations  Follow up growth in 4 week  Management and testing per in patient provider. ----------------------------------------------------------------------               Lin Landsman, MD Electronically Signed Final Report   12/16/2019 04:31 pm  ----------------------------------------------------------------------   I have reviewed the patient's current medications.  Assessment/Plan: PPROM, preterm contractions  Continue latency antibiotics, expectant management for now Pt has received 2200 procardia Spoke with MFM and it was noted that all interventions have been addressed Spoke to NICU staff and there is adequate staffing and neonatologist has already discussed interventions if the baby was delivered.    LOS: 2 days   Warden Fillers 12/16/2019,11:25 PM

## 2019-12-16 NOTE — Progress Notes (Signed)
Patient ID: Megan Fuller, female   DOB: 18-Dec-1985, 34 y.o.   MRN: [id number] FACULTY PRACTICE ANTEPARTUM(COMPREHENSIVE) NOTE  Megan Fuller is a 34 y.o. G1P0 at [redacted]w[redacted]d who is admitted for Preterm labor.   Fetal presentation is unsure. Length of Stay:  2  Days  Subjective: Patient was dizzy when standing while on magnesium, felt leaking fluid Patient reports the fetal movement as active. Patient reports uterine contraction  activity as rare. Patient reports  vaginal bleeding as none. Patient describes fluid per vagina as Other clear now some slight staining initially .  Vitals:  Blood pressure 128/79, pulse 100, temperature 98.2 F (36.8 C), temperature source Oral, resp. rate 18, height 5\' 4"  (1.626 m), weight 82.4 kg, SpO2 97 %. Physical Examination:  General appearance - alert, well appearing, and in no distress Heart - normal rate and regular rhythm Abdomen - soft, nontender, nondistended Fundal Height:  size equals dates Cervical Exam: Not evaluated. Extremities: extremities normal, atraumatic, no cyanosis or edema and Homans sign is negative, no sign of DVT  Membranes:ruptured  Fetal Monitoring:  Baseline: 150 bpm, Variability: Fair (1-6 bpm), Accelerations: Non-reactive but appropriate for gestational age and Decelerations: Absent  Labs:  Results for orders placed or performed during the hospital encounter of 12/14/19 (from the past 24 hour(s))  Comprehensive metabolic panel   Collection Time: 12/15/19  3:07 AM  Result Value Ref Range   Sodium 132 (L) 135 - 145 mmol/L   Potassium 3.9 3.5 - 5.1 mmol/L   Chloride 104 98 - 111 mmol/L   CO2 19 (L) 22 - 32 mmol/L   Glucose, Bld 167 (H) 70 - 99 mg/dL   BUN 5 (L) 6 - 20 mg/dL   Creatinine, Ser 12/17/19 0.44 - 1.00 mg/dL   Calcium 7.2 (L) 8.9 - 10.3 mg/dL   Total Protein 6.2 (L) 6.5 - 8.1 g/dL   Albumin 2.7 (L) 3.5 - 5.0 g/dL   AST 39 15 - 41 U/L   ALT 18 0 - 44 U/L   Alkaline Phosphatase 94 38 - 126 U/L   Total  Bilirubin 2.4 (H) 0.3 - 1.2 mg/dL   GFR, Estimated 9.92 >42 mL/min   Anion gap 9 5 - 15  Magnesium   Collection Time: 12/15/19  3:07 AM  Result Value Ref Range   Magnesium 4.9 (H) 1.7 - 2.4 mg/dL  CBC   Collection Time: 12/15/19  3:07 AM  Result Value Ref Range   WBC 12.8 (H) 4.0 - 10.5 K/uL   RBC 3.09 (L) 3.87 - 5.11 MIL/uL   Hemoglobin 10.9 (L) 12.0 - 15.0 g/dL   HCT 12/17/19 (L) 36 - 46 %   MCV 99.0 80.0 - 100.0 fL   MCH 35.3 (H) 26.0 - 34.0 pg   MCHC 35.6 30.0 - 36.0 g/dL   RDW 34.1 96.2 - 22.9 %   Platelets 61 (L) 150 - 400 K/uL   nRBC 0.0 0.0 - 0.2 %  Amnisure rupture of membrane (rom)not at [facility]   Collection Time: 12/16/19  1:55 AM  Result Value Ref Range   Amnisure ROM POSITIVE      Medications:  Scheduled . [START ON 12/18/2019] amoxicillin  500 mg Oral TID  . azithromycin  1,000 mg Oral Once  . docusate sodium  100 mg Oral Daily  . indomethacin  25 mg Oral Q6H  . oxytocin 40 units in LR 1000 mL  333 mL Intravenous Once  . pantoprazole  20 mg Oral Daily  .  prenatal multivitamin  1 tablet Oral Q1200  . sertraline  50 mg Oral Daily   I have reviewed the patient's current medications.  ASSESSMENT: Patient Active Problem List   Diagnosis Date Noted  . Preterm labor in second trimester 12/15/2019  . [redacted] weeks gestation of pregnancy 12/15/2019  . Supervision of high-risk pregnancy 12/14/2019  . Encounter for supervision of normal first pregnancy in second trimester 11/25/2019  . Supervision of high risk pregnancy, antepartum 11/25/2019  . UTI in pregnancy, antepartum, second trimester 10/27/2019  . Chronic ITP (idiopathic thrombocytopenia) (HCC) 10/27/2019  . History of vaginal bleeding 10/27/2019  . 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 PCOS (polycystic ovarian syndrome) 02/10/2014  PSROM, amnisure positive  PLAN: IV antibiotics followed by PO for 7 days. I informed her of diagnosis and answered her questions. Finish her  indocin course. [place] ordered  Scheryl Darter 12/16/2019,2:55 AM

## 2019-12-16 NOTE — Progress Notes (Signed)
FACULTY PRACTICE ANTEPARTUM COMPREHENSIVE PROGRESS NOTE  Megan Fuller is a 34 y.o. G1P0 at [redacted]w[redacted]d who is admitted for Preterm labor, now with PPROM this early morning.  Estimated Date of Delivery: 04/08/20 Fetal presentation is cephalic.  Length of Stay:  2 Days. Admitted 12/14/2019  Subjective: Patient is Spanish-speaking only, interpreter present for this encounter. She reports no current complaints. Patient reports good fetal movement.  She reports not feeling uterine contractions, no bleeding and no current loss of fluid per vagina. RN is concerned about frequent contractions noted on tocometer.  Vitals:  Blood pressure 116/76, pulse 98, temperature 98.3 F (36.8 C), temperature source Oral, resp. rate 18, height 5\' 4"  (1.626 m), weight 82.4 kg, SpO2 99 %. Physical Examination: CONSTITUTIONAL: Well-developed, well-nourished female in no acute distress.  NEUROLGIC: Alert and oriented to person, place, and time. Normal reflexes, muscle tone coordination. No cranial nerve deficit noted. PSYCHIATRIC: Normal mood and affect. Normal behavior. Normal judgment and thought content. CARDIOVASCULAR: Normal heart rate noted, regular rhythm RESPIRATORY: Effort and breath sounds normal, no problems with respiration noted MUSCULOSKELETAL: Normal range of motion. No edema and no tenderness. 2+ distal pulses. ABDOMEN: Soft, nontender, nondistended, gravid. CERVIX: Dilation: 2.5 Effacement (%): 90 Station: -2 Presentation: Vertex Exam by:: Dr. [email or web address] Bedside ultrasound confirmed cephalic presentation.  Fetal monitoring: FHR: 130 bpm, Variability: moderate, Accelerations: 10  x10, Decelerations: variable  Uterine activity: q2-4 minutes  Results for orders placed or performed during the hospital encounter of 12/14/19 (from the past 48 hour(s))  Cervicovaginal ancillary only     Status: None   Collection Time: 12/14/19 12:57 PM  Result Value Ref Range   Neisseria Gonorrhea Negative     Chlamydia Negative    Comment Normal Reference Ranger Chlamydia - Negative    Comment      Normal Reference Range Neisseria Gonorrhea - Negative  Type and screen [facility]     Status: None   Collection Time: 12/14/19  1:15 PM  Result Value Ref Range   ABO/RH(D) O POS    Antibody Screen NEG    Sample Expiration      12/17/2019,2359 Performed at [facility], [address] [place], [place]   CBC     Status: Abnormal   Collection Time: 12/14/19  1:27 PM  Result Value Ref Range   WBC 8.9 4.0 - 10.5 K/uL   RBC 3.01 (L) 3.87 - 5.11 MIL/uL   Hemoglobin 10.4 (L) 12.0 - 15.0 g/dL   HCT 12/16/19 (L) 36 - 46 %   MCV 103.7 (H) 80.0 - 100.0 fL   MCH 34.6 (H) 26.0 - 34.0 pg   MCHC 33.3 30.0 - 36.0 g/dL   RDW 14.4 81.8 - 56.3 %   Platelets 61 (L) 150 - 400 K/uL    Comment: REPEATED TO VERIFY SPECIMEN CHECKED FOR CLOTS Immature Platelet Fraction may be clinically indicated, consider ordering this additional test 14.9 CONSISTENT WITH PREVIOUS RESULT    nRBC 0.0 0.0 - 0.2 %    Comment: Performed at [facility], [address] [place], [place]  RPR     Status: None   Collection Time: 12/14/19  1:27 PM  Result Value Ref Range   RPR Ser Ql NON REACTIVE NON REACTIVE    Comment: Performed at [facility], [address] [place], [place]  Fibrinogen (coagulopathy lab panel)     Status: None   Collection Time: 12/14/19  1:27 PM  Result Value Ref Range   Fibrinogen 347 210 - 475 mg/dL    Comment: Performed at [facility], [address] [place], [place] [address]  Protime-INR (coagulopathy lab panel)     Status: None   Collection Time: 12/14/19  1:27 PM  Result Value Ref Range   Prothrombin Time 14.6 11.4 - 15.2 seconds   INR 1.2 0.8 - 1.2    Comment: (NOTE) INR goal varies based on device and disease states. Performed at [facility], [address] [place], [place] [address]   APTT (coagulopathy lab  panel)     Status: None   Collection Time: 12/14/19  1:27 PM  Result Value Ref Range   aPTT 30 24 - 36 seconds    Comment: Performed at [facility], [address] [place], [place] [address]  Culture, beta strep (group b only)     Status: None   Collection Time: 12/14/19  1:27 PM   Specimen: Vaginal/Rectal; Genital  Result Value Ref Range   Specimen Description VAGINAL/RECTAL    Special Requests NONE    Culture      NO GROUP B STREP (S.AGALACTIAE) ISOLATED Performed at [facility], [address] [place], [place] [address]    Report Status 12/16/2019 FINAL   Comprehensive metabolic panel     Status: Abnormal   Collection Time: 12/14/19  1:27 PM  Result Value Ref Range   Sodium 134 (L) 135 - 145 mmol/L   Potassium 3.9 3.5 - 5.1 mmol/L   Chloride 103 98 - 111 mmol/L   CO2 23 22 - 32 mmol/L   Glucose, Bld 76 70 - 99 mg/dL    Comment: Glucose reference range applies only to samples taken after fasting for at least 8 hours.   BUN 6 6 - 20 mg/dL   Creatinine, Ser 4.030.58 0.44 - 1.00 mg/dL   Calcium 8.8 (L) 8.9 - 10.3 mg/dL   Total Protein 5.7 (L) 6.5 - 8.1 g/dL   Albumin 2.5 (L) 3.5 - 5.0 g/dL   AST 38 15 - 41 U/L   ALT 14 0 - 44 U/L   Alkaline Phosphatase 82 38 - 126 U/L   Total Bilirubin 1.6 (H) 0.3 - 1.2 mg/dL   GFR, Estimated >47>60 >42>60 mL/min   Anion gap 8 5 - 15    Comment: Performed at [facility], [address] [place], [place] [address]  Amnisure rupture of membrane (rom)not at [facility]     Status: None   Collection Time: 12/14/19  1:50 PM  Result Value Ref Range   Amnisure ROM NEGATIVE     Comment: Performed at [facility], [address] [place], [place] [address]  Respiratory Panel by RT PCR (Flu A&B, Covid) - Nasopharyngeal Swab     Status: None   Collection Time: 12/14/19  1:58 PM   Specimen: Nasopharyngeal Swab  Result Value Ref Range   SARS Coronavirus 2 by RT PCR NEGATIVE NEGATIVE    Comment: (NOTE) SARS-CoV-2 target nucleic acids are  NOT DETECTED.  The SARS-CoV-2 RNA is generally detectable in upper respiratoy specimens during the acute phase of infection. The lowest concentration of SARS-CoV-2 viral copies this assay can detect is 131 copies/mL. A negative result does not preclude SARS-Cov-2 infection and should not be used as the sole basis for treatment or other patient management decisions. A negative result may occur with  improper specimen collection/handling, submission of specimen other than  nasopharyngeal swab, presence of viral mutation(s) within the areas targeted by this assay, and inadequate number of viral copies (<131 copies/mL). A negative result must be combined with clinical observations, patient history, and epidemiological information. The expected result is Negative.  Fact Sheet for Patients:  https://www.moore.com/  Fact Sheet for Healthcare Providers:  https://www.young.biz/  This test is no t yet approved or cleared by the [place] FDA and  has been authorized for detection and/or diagnosis of SARS-CoV-2 by FDA under an Emergency Use Authorization (EUA). This EUA will remain  in effect (meaning this test can be used) for the duration of the COVID-19 declaration under Section 564(b)(1) of the Act, 21 U.S.C. section 360bbb-3(b)(1), unless the authorization is terminated or revoked sooner.     Influenza A by PCR NEGATIVE NEGATIVE   Influenza B by PCR NEGATIVE NEGATIVE    Comment: (NOTE) The Xpert Xpress SARS-CoV-2/FLU/RSV assay is intended as an aid in  the diagnosis of influenza from Nasopharyngeal swab specimens and  should not be used as a sole basis for treatment. Nasal washings and  aspirates are unacceptable for Xpert Xpress SARS-CoV-2/FLU/RSV  testing.  Fact Sheet for Patients: https://www.moore.com/  Fact Sheet for Healthcare Providers: https://www.young.biz/  This test is not yet approved or  cleared by the [place] FDA and  has been authorized for detection and/or diagnosis of SARS-CoV-2 by  FDA under an Emergency Use Authorization (EUA). This EUA will remain  in effect (meaning this test can be used) for the duration of the  Covid-19 declaration under Section 564(b)(1) of the Act, 21  U.S.C. section 360bbb-3(b)(1), unless the authorization is  terminated or revoked. Performed at [facility], [address] [place], [place] [address]   Protein / creatinine ratio, urine     Status: None   Collection Time: 12/14/19  6:12 PM  Result Value Ref Range   Creatinine, Urine 62.39 mg/dL   Total Protein, Urine 7 mg/dL    Comment: NO NORMAL RANGE ESTABLISHED FOR THIS TEST   Protein Creatinine Ratio 0.11 0.00 - 0.15 mg/mg[Cre]    Comment: Performed at [facility], [address] [place], [place] [address]  OB Urine Culture     Status: None   Collection Time: 12/14/19  6:12 PM   Specimen: OB Clean Catch; Urine  Result Value Ref Range   Specimen Description OB CLEAN CATCH    Special Requests NONE    Culture      NO GROWTH NO GROUP B STREP (S.AGALACTIAE) ISOLATED Performed at [facility], [address] [place], [place] [address]    Report Status 12/16/2019 FINAL   Comprehensive metabolic panel     Status: Abnormal   Collection Time: 12/15/19  3:07 AM  Result Value Ref Range   Sodium 132 (L) 135 - 145 mmol/L   Potassium 3.9 3.5 - 5.1 mmol/L   Chloride 104 98 - 111 mmol/L   CO2 19 (L) 22 - 32 mmol/L   Glucose, Bld 167 (H) 70 - 99 mg/dL    Comment: Glucose reference range applies only to samples taken after fasting for at least 8 hours.   BUN 5 (L) 6 - 20 mg/dL   Creatinine, Ser 0.35 0.44 - 1.00 mg/dL   Calcium 7.2 (L) 8.9 - 10.3 mg/dL   Total Protein 6.2 (L) 6.5 - 8.1 g/dL   Albumin 2.7 (L) 3.5 - 5.0 g/dL   AST 39 15 - 41 U/L   ALT 18 0 -  44 U/L   Alkaline Phosphatase 94 38 - 126 U/L   Total Bilirubin 2.4 (H) 0.3 - 1.2 mg/dL   GFR, Estimated >56  >81 mL/min   Anion gap 9 5 - 15    Comment: Performed at [facility], [address] [place], [place] [address]  Magnesium     Status: Abnormal   Collection Time: 12/15/19  3:07 AM  Result Value Ref Range   Magnesium 4.9 (H) 1.7 - 2.4 mg/dL    Comment: Performed at [facility], [address] [place], [place] [address]  CBC     Status: Abnormal   Collection Time: 12/15/19  3:07 AM  Result Value Ref Range   WBC 12.8 (H) 4.0 - 10.5 K/uL   RBC 3.09 (L) 3.87 - 5.11 MIL/uL   Hemoglobin 10.9 (L) 12.0 - 15.0 g/dL   HCT 94.4 (L) 36 - 46 %   MCV 99.0 80.0 - 100.0 fL   MCH 35.3 (H) 26.0 - 34.0 pg   MCHC 35.6 30.0 - 36.0 g/dL   RDW 96.7 59.1 - 63.8 %   Platelets 61 (L) 150 - 400 K/uL    Comment: REPEATED TO VERIFY Immature Platelet Fraction may be clinically indicated, consider ordering this additional test [id number]    nRBC 0.0 0.0 - 0.2 %    Comment: Performed at [facility], [address], [place], [place] [address]  Amnisure rupture of membrane (rom)not at [facility]     Status: None   Collection Time: 12/16/19  1:55 AM  Result Value Ref Range   Amnisure ROM POSITIVE     Comment: Performed at [facility], [address], [place], [place] [address]    No results found.  Current scheduled medications . [START ON 12/18/2019] amoxicillin  500 mg Oral Q8H  . docusate sodium  100 mg Oral Daily  . indomethacin  25 mg Oral Q6H  . NIFEdipine  30 mg Oral BID  . oxytocin 40 units in LR 1000 mL  333 mL Intravenous Once  . pantoprazole  20 mg Oral Daily  . prenatal multivitamin  1 tablet Oral Q1200  . sertraline  50 mg Oral Daily    I have reviewed the patient's current medications.  ASSESSMENT: Principal Problem:   Preterm premature rupture of membranes (PPROM) in second trimester, antepartum Active Problems:   PCOS (polycystic ovarian syndrome)   UTI in pregnancy, antepartum, second trimester   Chronic ITP (idiopathic thrombocytopenia) (HCC)   Depression  affecting pregnancy in second trimester, antepartum   Supervision of high-risk pregnancy   Preterm labor in second trimester   [redacted] weeks gestation of pregnancy   PLAN: Procardia XL 30 bid ordered for contractions On latency antibiotics now Heme Onc (Dr. Bertis Ruddy) curbsided, she feels current platelet count in 60K is her baseline, no bleeding, no intervention needed for now.  Will reconsult as needed. Category 1 FHR tracing for GA, GBS pending.  Appreciate NICU consult; they were updated about PPROM status She has received BMZ x 2 doses. Zoloft 50 mg po daily started for depression Continue close observation and routine antenatal care.   Jaynie Collins, MD, FACOG Obstetrician & Gynecologist, [facility] for [employer], [facility]

## 2019-12-17 ENCOUNTER — Telehealth: Payer: Self-pay | Admitting: Hematology

## 2019-12-17 LAB — CBC
HCT: 24.7 % — ABNORMAL LOW (ref 36.0–46.0)
Hemoglobin: 8.5 g/dL — ABNORMAL LOW (ref 12.0–15.0)
MCH: 34.3 pg — ABNORMAL HIGH (ref 26.0–34.0)
MCHC: 34.4 g/dL (ref 30.0–36.0)
MCV: 99.6 fL (ref 80.0–100.0)
Platelets: 59 10*3/uL — ABNORMAL LOW (ref 150–400)
RBC: 2.48 MIL/uL — ABNORMAL LOW (ref 3.87–5.11)
RDW: 13.1 % (ref 11.5–15.5)
WBC: 9.5 10*3/uL (ref 4.0–10.5)
nRBC: 0 % (ref 0.0–0.2)

## 2019-12-17 NOTE — Telephone Encounter (Signed)
Pt has been rescheduled to see Dr. Candise Che on 11/10 at 11am. Pt is currently in the hospital. Letter mailed.

## 2019-12-17 NOTE — Progress Notes (Signed)
FACULTY PRACTICE ANTEPARTUM PROGRESS NOTE  Megan Fuller is a 34 y.o. G1P0 at [redacted]w[redacted]d who is admitted for PROM.  Estimated Date of Delivery: 04/08/20 Fetal presentation is cephalic.  Length of Stay:  3 Days. Admitted 12/14/2019  Subjective: Pt seen this morning.  She states she did not sleep well.  Pt not feeling a lot of fetal movement.  She continues to leak fluid, but denies any organized contractions. Pt denies bleeding and leaking of fluid per vagina.  Vitals:  Blood pressure 103/61, pulse 92, temperature 98.2 F (36.8 C), temperature source Oral, resp. rate 18, height 5\' 4"  (1.626 m), weight 82.4 kg, SpO2 100 %. Physical Examination: CONSTITUTIONAL: Well-developed, well-nourished female in no acute distress.  HENT:  Normocephalic, atraumatic NECK: Normal range of motion, supple, no masses. SKIN: Skin is warm and dry. No rash noted. Not diaphoretic. No erythema. No pallor. NEUROLGIC: Alert and oriented to person, place, and time. Normal reflexes, muscle tone coordination. No cranial nerve deficit noted. PSYCHIATRIC: Normal mood and affect. Normal behavior. Normal judgment and thought content. MUSCULOSKELETAL: Normal range of motion. No edema and no tenderness. ABDOMEN: Soft, nontender, nondistended, gravid. CERVIX: deferred  Fetal monitoring: FHR: 145 bpm, Variability: moderate, Accelerations: Present, Decelerations: Absent  Uterine activity: irregular   Results for orders placed or performed during the hospital encounter of 12/14/19 (from the past 48 hour(s))  Amnisure rupture of membrane (rom)not at [facility]     Status: None   Collection Time: 12/16/19  1:55 AM  Result Value Ref Range   Amnisure ROM POSITIVE     Comment: Performed at [facility], [address], [place], [place]  CBC     Status: Abnormal   Collection Time: 12/17/19  5:08 AM  Result Value Ref Range   WBC 9.5 4.0 - 10.5 K/uL   RBC 2.48 (L) 3.87 - 5.11 MIL/uL   Hemoglobin 8.5 (L) 12.0 - 15.0  g/dL   HCT 12/19/19 (L) 36 - 46 %   MCV 99.6 80.0 - 100.0 fL   MCH 34.3 (H) 26.0 - 34.0 pg   MCHC 34.4 30.0 - 36.0 g/dL   RDW 92.4 26.8 - 34.1 %   Platelets 59 (L) 150 - 400 K/uL    Comment: REPEATED TO VERIFY Immature Platelet Fraction may be clinically indicated, consider ordering this additional test 96.2 CONSISTENT WITH PREVIOUS RESULT    nRBC 0.0 0.0 - 0.2 %    Comment: Performed at [facility], [address], [place], [place]    I have reviewed the patient's current medications.  ASSESSMENT: Principal Problem:   Preterm premature rupture of membranes (PPROM) in second trimester, antepartum Active Problems:   PCOS (polycystic ovarian syndrome)   UTI in pregnancy, antepartum, second trimester   Chronic ITP (idiopathic thrombocytopenia) (HCC)   Depression affecting pregnancy in second trimester, antepartum   Supervision of high-risk pregnancy   Preterm labor in second trimester   [redacted] weeks gestation of pregnancy   PLAN: Continue latency antibiotics and scheduled procardia Monitor for onset of pretermlabor Completed BMZ and magnesium sulfate previously NICU and neonatology is aware Consider weekly CBC to monitor platelet count   Continue routine antenatal care.   [id number], MD [employer] Faculty Attending, [facility] 12/17/2019 7:21 AM

## 2019-12-18 LAB — TYPE AND SCREEN
ABO/RH(D): O POS
Antibody Screen: NEGATIVE

## 2019-12-18 NOTE — Progress Notes (Signed)
Patient ID: Megan Fuller, female   DOB: 07/29/1985, 34 y.o.   MRN: [id number] FACULTY PRACTICE ANTEPARTUM(COMPREHENSIVE) NOTE  Megan Fuller is a 34 y.o. G1P0 at [redacted]w[redacted]d by early ultrasound who is admitted for PROM.   Fetal presentation is cephalic. Length of Stay:  4  Days  Subjective: No complaint. Interpreter assisted with encounter Patient reports the fetal movement as active. Patient reports uterine contraction  activity as none. Patient reports  vaginal bleeding as none. Patient describes fluid per vagina as Other yellow.  Vitals:  Blood pressure 113/71, pulse (!) 102, temperature 99.1 F (37.3 C), temperature source Oral, resp. rate 16, height 5\' 4"  (1.626 m), weight 82.4 kg, SpO2 100 %. Physical Examination:  General appearance - alert, well appearing, and in no distress Heart - normal rate and regular rhythm Abdomen - soft, nontender, nondistended Fundal Height:  size equals dates Cervical Exam: Not evaluated.  Extremities: extremities normal, atraumatic, no cyanosis or edema and Homans sign is negative, no sign of DVT  Membranes:ruptured  Fetal Monitoring: Baseline 150, no deceleration, moderate variability  Labs:  Results for orders placed or performed during the hospital encounter of 12/14/19 (from the past 24 hour(s))  Type and screen [facility]   Collection Time: 12/18/19  5:44 AM  Result Value Ref Range   ABO/RH(D) O POS    Antibody Screen NEG    Sample Expiration      12/21/2019,2359 Performed at [facility], [address] [place], [place]      Medications:  Scheduled . amoxicillin  500 mg Oral Q8H  . docusate sodium  100 mg Oral Daily  . NIFEdipine  30 mg Oral BID  . oxytocin 40 units in LR 1000 mL  333 mL Intravenous Once  . pantoprazole  20 mg Oral Daily  . prenatal multivitamin  1 tablet Oral Q1200  . sertraline  50 mg Oral Daily   I have reviewed the patient's current  medications.  ASSESSMENT: Patient Active Problem List   Diagnosis Date Noted  . Preterm premature rupture of membranes (PPROM) in second trimester, antepartum 12/16/2019  . Preterm labor in second trimester 12/15/2019  . [redacted] weeks gestation of pregnancy 12/15/2019  . Supervision of high-risk pregnancy 11/25/2019  . UTI in pregnancy, antepartum, second trimester 10/27/2019  . Chronic ITP (idiopathic thrombocytopenia) (HCC) 10/27/2019  . 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 PCOS (polycystic ovarian syndrome) 02/10/2014    PLAN: Continue latency antibiotics and scheduled procardia Monitor for onset of preterm labor Completed BMZ and magnesium sulfate previously NICU and neonatology is aware  02/12/2014 12/18/2019,11:36 AM

## 2019-12-19 MED ORDER — ALUM & MAG HYDROXIDE-SIMETH 200-200-20 MG/5ML PO SUSP: 30.0000 mL | Freq: Four times a day (QID) | ORAL | Status: DC | PRN

## 2019-12-19 NOTE — Progress Notes (Signed)
Patient ID: Megan Fuller, female   DOB: [birth year], 34 y.o.   MRN: [id number] ACULTY PRACTICE ANTEPARTUM COMPREHENSIVE PROGRESS NOTE  Megan Fuller is a 34 y.o. G1P0 at [redacted]w[redacted]d  who is admitted for PROM.   Fetal presentation is cephalic. Length of Stay:  5  Days  Subjective: Pt has no complaints this morning Patient reports good fetal movement.  She reports no uterine contractions. Still has some LOF but no bleeding  Vitals:  Blood pressure 134/72, pulse (!) 106, temperature 98 F (36.7 C), temperature source Oral, resp. rate 17, height 5\' 4"  (1.626 m), weight 82.4 kg, SpO2 99 %.   Physical Examination: Lungs clear Heart RRR Abd soft + BS gravid non tedner  Fetal Monitoring:  150's, no decels, no ut ctx  Labs:  No results found for this or any previous visit (from the past 24 hour(s)).  Imaging Studies:    NA   Medications:  Scheduled . amoxicillin  500 mg Oral Q8H  . docusate sodium  100 mg Oral Daily  . NIFEdipine  30 mg Oral BID  . pantoprazole  20 mg Oral Daily  . prenatal multivitamin  1 tablet Oral Q1200  . sertraline  50 mg Oral Daily   I have reviewed the patient's current medications.  ASSESSMENT: IUP 24 1/7 PROM Chronic ITP Depression  PLAN: Stable. S/P BMZ and magnesium. Continue with latency antibiotics. No S/sx of infection. Delivery for maternal and fetal indications. Continue routine antenatal care.   3/7 12/19/2019,9:55 AM

## 2019-12-20 DIAGNOSIS — Z3A24 24 weeks gestation of pregnancy: Secondary | ICD-10-CM

## 2019-12-20 LAB — CBC
HCT: 27.5 % — ABNORMAL LOW (ref 36.0–46.0)
Hemoglobin: 9.6 g/dL — ABNORMAL LOW (ref 12.0–15.0)
MCH: 34.7 pg — ABNORMAL HIGH (ref 26.0–34.0)
MCHC: 34.9 g/dL (ref 30.0–36.0)
MCV: 99.3 fL (ref 80.0–100.0)
Platelets: 61 10*3/uL — ABNORMAL LOW (ref 150–400)
RBC: 2.77 MIL/uL — ABNORMAL LOW (ref 3.87–5.11)
RDW: 13 % (ref 11.5–15.5)
WBC: 7.5 10*3/uL (ref 4.0–10.5)
nRBC: 0 % (ref 0.0–0.2)

## 2019-12-20 NOTE — Progress Notes (Signed)
Patient ID: Megan Fuller, female   DOB: 29-Dec-1985, 34 y.o.   MRN: [id number] FACULTY PRACTICE ANTEPARTUM(COMPREHENSIVE) NOTE  Annis I Lingenfelter is a 34 y.o. G1P0 at [redacted]w[redacted]d by early ultrasound who is admitted for PROM.   Fetal presentation is cephalic. Length of Stay:  6  Days  Subjective: No complaints Patient reports the fetal movement as active. Patient reports uterine contraction  activity as none. Patient reports  vaginal bleeding as none. Patient describes fluid per vagina as Clear.  Vitals:  Blood pressure 116/62, pulse (!) 108, temperature 98.1 F (36.7 C), temperature source Oral, resp. rate 19, height 5\' 4"  (1.626 m), weight 82.4 kg, SpO2 98 %. Physical Examination:  General appearance - alert, well appearing, and in no distress Heart - normal rate and regular rhythm Abdomen - soft, nontender, nondistended Fundal Height:  size equals dates Cervical Exam: Not evaluated.  Extremities: extremities normal, atraumatic, no cyanosis or edema and Homans sign is negative, no sign of DVT  Membranes:ruptured  Fetal Monitoring:   Fetal Heart Rate A  Mode External filed at 12/20/2019 1043  Baseline Rate (A) 150 bpm  [difficult to trace] filed at 12/20/2019 1043  Variability 6-25 BPM filed at 12/20/2019 1043  Accelerations None  [10 x 10 accelerations heard but not graphed] filed at 12/20/2019 1043  Decelerations None filed at 12/20/2019 1043     Labs:  Results for orders placed or performed during the hospital encounter of 12/14/19 (from the past 24 hour(s))  CBC   Collection Time: 12/20/19  6:51 AM  Result Value Ref Range   WBC 7.5 4.0 - 10.5 K/uL   RBC 2.77 (L) 3.87 - 5.11 MIL/uL   Hemoglobin 9.6 (L) 12.0 - 15.0 g/dL   HCT 12/22/19 (L) 36 - 46 %   MCV 99.3 80.0 - 100.0 fL   MCH 34.7 (H) 26.0 - 34.0 pg   MCHC 34.9 30.0 - 36.0 g/dL   RDW 02.5 42.7 - 06.2 %   Platelets 61 (L) 150 - 400 K/uL   nRBC 0.0 0.0 - 0.2 %     Medications:  Scheduled . amoxicillin  500 mg Oral  Q8H  . docusate sodium  100 mg Oral Daily  . NIFEdipine  30 mg Oral BID  . pantoprazole  20 mg Oral Daily  . prenatal multivitamin  1 tablet Oral Q1200  . sertraline  50 mg Oral Daily   I have reviewed the patient's current medications.  ASSESSMENT: Patient Active Problem List   Diagnosis Date Noted  . Preterm premature rupture of membranes (PPROM) in second trimester, antepartum 12/16/2019  . Preterm labor in second trimester 12/15/2019  . [redacted] weeks gestation of pregnancy 12/15/2019  . Supervision of high-risk pregnancy 11/25/2019  . UTI in pregnancy, antepartum, second trimester 10/27/2019  . Chronic ITP (idiopathic thrombocytopenia) (HCC) 10/27/2019  . 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 PCOS (polycystic ovarian syndrome) 02/10/2014    PLAN: Continue hospitalization and observe for sx of PTL, infection  02/12/2014 12/20/2019,10:48 AM

## 2019-12-21 ENCOUNTER — Inpatient Hospital Stay: Payer: No Typology Code available for payment source | Admitting: Hematology

## 2019-12-21 LAB — TYPE AND SCREEN
ABO/RH(D): O POS
Antibody Screen: NEGATIVE

## 2019-12-21 NOTE — Progress Notes (Signed)
Patient ID: Megan Fuller, female   DOB: [birth year], 34 y.o.   MRN: [id number] FACULTY PRACTICE ANTEPARTUM(COMPREHENSIVE) NOTE  Megan Fuller is a 34 y.o. G1P0 at [redacted]w[redacted]d by early ultrasound who is admitted for Preterm labor, PROM.   Fetal presentation is cephalic. Length of Stay:  7  Days  Subjective: Decreased fetal movement noted Patient reports the fetal movement as decreased . Patient reports uterine contraction  activity as none. Patient reports  vaginal bleeding as none. Patient describes fluid per vagina as Other yellow.  Vitals:  Blood pressure 120/67, pulse 100, temperature 98.4 F (36.9 C), temperature source Oral, resp. rate 16, height 5\' 4"  (1.626 m), weight 82.4 kg, SpO2 100 %. Physical Examination:  General appearance - alert, well appearing, and in no distress Heart - normal rate and regular rhythm Abdomen - soft, nontender, nondistended Fundal Height:  size equals dates Cervical Exam: Not evaluated Extremities: extremities normal, atraumatic, no cyanosis or edema and Homans sign is negative, no sign of DVT  Membranes:ruptured  Fetal Monitoring:   Fetal Heart Rate A  Mode External filed at 12/20/2019 2347  Baseline Rate (A) 145 bpm filed at 12/20/2019 2347  Variability 6-25 BPM filed at 12/20/2019 2347  Accelerations 10 x 10 filed at 12/20/2019 2347  Decelerations Early filed at 12/20/2019 2347     Labs:  Results for orders placed or performed during the hospital encounter of 12/14/19 (from the past 24 hour(s))  Type and screen [facility]   Collection Time: 12/21/19  6:11 AM  Result Value Ref Range   ABO/RH(D) O POS    Antibody Screen NEG    Sample Expiration      12/24/2019,2359 Performed at [facility], [address] [place], [place]      Medications:  Scheduled . amoxicillin  500 mg Oral Q8H  . docusate sodium  100 mg Oral Daily  . NIFEdipine  30 mg Oral BID  . pantoprazole  20 mg Oral Daily  .  prenatal multivitamin  1 tablet Oral Q1200   I have reviewed the patient's current medications.  ASSESSMENT: Patient Active Problem List   Diagnosis Date Noted  . Preterm premature rupture of membranes (PPROM) in second trimester, antepartum 12/16/2019  . Preterm labor in second trimester 12/15/2019  . [redacted] weeks gestation of pregnancy 12/15/2019  . Supervision of high-risk pregnancy 11/25/2019  . UTI in pregnancy, antepartum, second trimester 10/27/2019  . Chronic ITP (idiopathic thrombocytopenia) (HCC) 10/27/2019  . 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 PCOS (polycystic ovarian syndrome) 02/10/2014    PLAN: Complete course of antibiotics-7 day total. Fetal tracing is reassuring  02/12/2014 12/21/2019,12:24 PM

## 2019-12-21 NOTE — Progress Notes (Signed)
Initial Nutrition Assessment  DOCUMENTATION CODES:   Not applicable  INTERVENTION:  Regular Diet Pt may order double protein portions and snacks TID if she makes request when ordering meals   NUTRITION DIAGNOSIS:  Increased nutrient needs related to  (pregnancy and fetal growth requirements) as evidenced by  (24 weeks IUP).  GOAL:  Patient will meet greater than or equal to 90% of their needs, Weight gain  MONITOR:   Weight trends  REASON FOR ASSESSMENT:   Antenatal, LOS    ASSESSMENT:   24 3/7 weeks IUP, adm w/ PROM  wt at approx 6 weeks 77.7 kg, BMI 29.4. 10 lb weight gain to date. 10/25 Hct 27.5%   Diet Order:   Diet Order            Diet regular Room service appropriate? Yes; Fluid consistency: Thin  Diet effective now                 EDUCATION NEEDS:   No education needs have been identified at this time  Skin:  Skin Assessment: Reviewed RN Assessment   Height:   Ht Readings from Last 1 Encounters:  12/14/19 5\' 4"  (1.626 m)    Weight:   Wt Readings from Last 1 Encounters:  12/14/19 82.4 kg    Ideal Body Weight:   120 lbs  BMI:  Body mass index is 31.17 kg/m.  Estimated Nutritional Needs:   Kcal:  1900-2100  Protein:  85-95 g  Fluid:  2.2 L    12/16/19 M.M LDN Neonatal Nutrition Support Specialist/RD III

## 2019-12-22 MED ORDER — FERROUS SULFATE 325 (65 FE) MG PO TABS
325.0000 mg | ORAL_TABLET | Freq: Every day | ORAL | Status: DC
  Administered 2019-12-22 – 2019-12-26 (×5): 325 mg via ORAL
  Filled 2019-12-22 (×5): qty 1

## 2019-12-22 NOTE — Progress Notes (Signed)
Pt refused NST. Dr. Macon Large notified. RN instructed to change NST order to daily, instead of BID.

## 2019-12-22 NOTE — Progress Notes (Signed)
FACULTY PRACTICE ANTEPARTUM COMPREHENSIVE PROGRESS NOTE  Megan Fuller is a 34 y.o. G1P0 at [redacted]w[redacted]d who is admitted for Preterm labor, now with PPROM this early morning.  Estimated Date of Delivery: 04/08/20 Fetal presentation is cephalic.  Length of Stay:  8 Days. Admitted 12/14/2019  Subjective: Patient is Spanish-speaking only, interpreter present for this encounter. She reports no current complaints. Patient reports good fetal movement.  She reports not feeling uterine contractions, no bleeding and no current loss of fluid per vagina. RN is concerned about frequent contractions noted on tocometer.  Vitals:  Blood pressure 120/66, pulse 92, temperature 98 F (36.7 C), temperature source Oral, resp. rate 18, height 5\' 4"  (1.626 m), weight 82.4 kg, SpO2 100 %. Physical Examination: CONSTITUTIONAL: Well-developed, well-nourished female in no acute distress.  NEUROLGIC: Alert and oriented to person, place, and time. Normal reflexes, muscle tone coordination. No cranial nerve deficit noted. PSYCHIATRIC: Normal mood and affect. Normal behavior. Normal judgment and thought content. CARDIOVASCULAR: Normal heart rate noted, regular rhythm RESPIRATORY: Effort and breath sounds normal, no problems with respiration noted MUSCULOSKELETAL: Normal range of motion. No edema and no tenderness. 2+ distal pulses. ABDOMEN: Soft, nontender, nondistended, gravid. CERVIX: Dilation: 2.5 Effacement (%): 90 Station: -2 Presentation: Vertex Exam by:: Dr. [email or web address] Bedside ultrasound confirmed cephalic presentation.  Fetal monitoring: FHR: 140 bpm, Variability: moderate, Accelerations: none, Decelerations: none Uterine activity: rare  Results for orders placed or performed during the hospital encounter of 12/14/19 (from the past 48 hour(s))  Type and screen [facility]     Status: None   Collection Time: 12/21/19  6:11 AM  Result Value Ref Range   ABO/RH(D) O POS    Antibody Screen NEG     Sample Expiration      12/24/2019,2359 Performed at [facility], [address] [place], [place]    CBC Latest Ref Rng & Units 12/20/2019 12/17/2019 12/15/2019  WBC 4.0 - 10.5 K/uL 7.5 9.5 12.8(H)  Hemoglobin 12.0 - 15.0 g/dL 12/17/2019) 6.0(A) 10.9(L)  Hematocrit 36 - 46 % 27.5(L) 24.7(L) 30.6(L)  Platelets 150 - 400 K/uL 61(L) 59(L) 61(L)     No results found.  Current scheduled medications . amoxicillin  500 mg Oral Q8H  . docusate sodium  100 mg Oral Daily  . NIFEdipine  30 mg Oral BID  . pantoprazole  20 mg Oral Daily  . prenatal multivitamin  1 tablet Oral Q1200    I have reviewed the patient's current medications.  ASSESSMENT: Principal Problem:   Preterm premature rupture of membranes (PPROM) in second trimester, antepartum Active Problems:   PCOS (polycystic ovarian syndrome)   UTI in pregnancy, antepartum, second trimester   Chronic ITP (idiopathic thrombocytopenia) (HCC)   Depression affecting pregnancy in second trimester, antepartum   Supervision of high-risk pregnancy   Preterm labor in second trimester   [redacted] weeks gestation of pregnancy   PLAN: Procardia XL 30 bid for contractions On latency antibiotics now Heme Onc (Dr. 5.4(U) already curbsided, she feels current platelet count in 61K is her baseline, no bleeding, no intervention needed for now.  Will reconsult as needed. Category 1 FHR tracing for GA, GBS pending.  Appreciate NICU consult. She has received BMZ x 2 doses. Zoloft 50 mg po daily for depression Continue close observation and routine antenatal care.   Bertis Ruddy, MD, FACOG Obstetrician & Gynecologist, [facility] for [facility]

## 2019-12-23 ENCOUNTER — Inpatient Hospital Stay (HOSPITAL_BASED_OUTPATIENT_CLINIC_OR_DEPARTMENT_OTHER): Payer: Medicaid Other

## 2019-12-23 DIAGNOSIS — O99212 Obesity complicating pregnancy, second trimester: Secondary | ICD-10-CM

## 2019-12-23 DIAGNOSIS — D693 Immune thrombocytopenic purpura: Secondary | ICD-10-CM

## 2019-12-23 DIAGNOSIS — O99112 Other diseases of the blood and blood-forming organs and certain disorders involving the immune mechanism complicating pregnancy, second trimester: Secondary | ICD-10-CM

## 2019-12-23 DIAGNOSIS — O42912 Preterm premature rupture of membranes, unspecified as to length of time between rupture and onset of labor, second trimester: Secondary | ICD-10-CM

## 2019-12-23 DIAGNOSIS — Z3A24 24 weeks gestation of pregnancy: Secondary | ICD-10-CM

## 2019-12-23 DIAGNOSIS — O368120 Decreased fetal movements, second trimester, not applicable or unspecified: Secondary | ICD-10-CM

## 2019-12-23 DIAGNOSIS — E669 Obesity, unspecified: Secondary | ICD-10-CM

## 2019-12-23 LAB — CBC
HCT: 27.1 % — ABNORMAL LOW (ref 36.0–46.0)
Hemoglobin: 9.5 g/dL — ABNORMAL LOW (ref 12.0–15.0)
MCH: 34.8 pg — ABNORMAL HIGH (ref 26.0–34.0)
MCHC: 35.1 g/dL (ref 30.0–36.0)
MCV: 99.3 fL (ref 80.0–100.0)
Platelets: 59 10*3/uL — ABNORMAL LOW (ref 150–400)
RBC: 2.73 MIL/uL — ABNORMAL LOW (ref 3.87–5.11)
RDW: 13.3 % (ref 11.5–15.5)
WBC: 8.1 10*3/uL (ref 4.0–10.5)
nRBC: 0 % (ref 0.0–0.2)

## 2019-12-23 NOTE — Progress Notes (Signed)
Patient ID: Megan Fuller, female   DOB: 29-Jan-1986, 34 y.o.   MRN: [id number] FACULTY PRACTICE ANTEPARTUM(COMPREHENSIVE) NOTE  Megan Fuller is a 34 y.o. G1P0 at [redacted]w[redacted]d by LMP who is admitted for Preterm labor, PROM.   Fetal presentation is cephalic. Length of Stay:  9  Days  Subjective: Still concerned by decreased fetal movement, asked about [place] Patient reports the fetal movement as decreased . Patient reports uterine contraction  activity as none. Patient reports  vaginal bleeding as none. Patient describes fluid per vagina as Clear.  Vitals:  Blood pressure 123/72, pulse (!) 106, temperature 98.5 F (36.9 C), temperature source Oral, resp. rate 17, height 5\' 4"  (1.626 m), weight 82.4 kg, SpO2 100 %. Physical Examination:  General appearance - alert, well appearing, and in no distress Heart - normal rate and regular rhythm Abdomen - soft, nontender, nondistended Fundal Height:  size equals dates, fundus soft and NT Cervical Exam: Not evaluated. Extremities: extremities normal, atraumatic, no cyanosis or edema and Homans sign is negative, no sign of DVT with DTRs 2+ bilaterally Membranes:ruptured  Fetal Monitoring:  Fetal Heart Rate A  Mode External filed at 12/22/2019 1021  Baseline Rate (A) 145 bpm filed at 12/22/2019 1021  Variability <5 BPM filed at 12/22/2019 1021  Accelerations 10 x 10 filed at 12/22/2019 1021  Decelerations None filed at 12/22/2019 1021      Labs:  Results for orders placed or performed during the hospital encounter of 12/14/19 (from the past 24 hour(s))  CBC   Collection Time: 12/23/19  6:18 AM  Result Value Ref Range   WBC 8.1 4.0 - 10.5 K/uL   RBC 2.73 (L) 3.87 - 5.11 MIL/uL   Hemoglobin 9.5 (L) 12.0 - 15.0 g/dL   HCT 12/25/19 (L) 36 - 46 %   MCV 99.3 80.0 - 100.0 fL   MCH 34.8 (H) 26.0 - 34.0 pg   MCHC 35.1 30.0 - 36.0 g/dL   RDW 31.5 17.6 - 16.0 %   Platelets 59 (L) 150 - 400 K/uL   nRBC 0.0 0.0 - 0.2 %     Medications:   Scheduled  docusate sodium  100 mg Oral Daily   ferrous sulfate  325 mg Oral Daily   NIFEdipine  30 mg Oral BID   pantoprazole  20 mg Oral Daily   prenatal multivitamin  1 tablet Oral Q1200   I have reviewed the patient's current medications.  ASSESSMENT: Patient Active Problem List   Diagnosis Date Noted   Preterm premature rupture of membranes (PPROM) in second trimester, antepartum 12/16/2019   Preterm labor in second trimester 12/15/2019   [redacted] weeks gestation of pregnancy 12/15/2019   Supervision of high-risk pregnancy 11/25/2019   UTI in pregnancy, antepartum, second trimester 10/27/2019   Chronic ITP (idiopathic thrombocytopenia) (HCC) 10/27/2019   Depression affecting pregnancy in second trimester, antepartum 10/27/2019   PCOS (polycystic ovarian syndrome) 02/10/2014    PLAN: Fetal assessment monitor and BPP. ABX course is complete. Continue observation in hospital  02/12/2014 12/23/2019,11:32 AM

## 2019-12-24 NOTE — Progress Notes (Signed)
Ordered pt meals, assisted Dr Debroah Loop with interpretation by Orlan Leavens Spanish Interpreter.

## 2019-12-24 NOTE — Progress Notes (Signed)
Patient ID: Megan Fuller, female   DOB: 01-10-1986, 34 y.o.   MRN: [id number] FACULTY PRACTICE ANTEPARTUM(COMPREHENSIVE) NOTE  Megan Fuller is a 34 y.o. G1P0 at [redacted]w[redacted]d by early ultrasound who is admitted for Preterm labor, PROM.   Fetal presentation is cephalic. Length of Stay:  10  Days  Subjective: Doesn't feel baby's movement, was reassured by [place] yesterday Patient reports the fetal movement as decreased . Patient reports uterine contraction  activity as none. Patient reports  vaginal bleeding as none. Patient describes fluid per vagina as Clear.  Vitals:  Blood pressure 130/70, pulse (!) 110, temperature 99.2 F (37.3 C), temperature source Oral, resp. rate 18, height 5\' 4"  (1.626 m), weight 82.4 kg, SpO2 98 %. Physical Examination:  General appearance - alert, well appearing, and in no distress Heart - normal rate and regular rhythm Abdomen - soft, nontender, nondistended Fundal Height:  size equals dates Cervical Exam: Not evaluated.  Extremities: extremities normal, atraumatic, no cyanosis or edema and Homans sign is negative, no sign of DVT Membranes:ruptured  Fetal Monitoring:   Fetal Heart Rate A  Mode External filed at 12/22/2019 1021  Baseline Rate (A) 145 bpm filed at 12/22/2019 1021  Variability <5 BPM filed at 12/22/2019 1021  Accelerations 10 x 10 filed at 12/22/2019 1021  Decelerations None filed at 12/22/2019 1021     Labs:  Results for orders placed or performed during the hospital encounter of 12/14/19 (from the past 24 hour(s))  Type and screen [facility]   Collection Time: 12/24/19  6:56 AM  Result Value Ref Range   ABO/RH(D) O POS    Antibody Screen NEG    Sample Expiration      12/27/2019,2359 Performed at [facility], [address], [place], [place]    ----------------------------------------------------------------------  OBSTETRICS REPORT                       (Signed Final 12/23/2019 02:15  pm) ---------------------------------------------------------------------- Patient Info  ID #:       12/25/2019                          D.O.B.:  December 17, 1985 (34 yrs)  Name:       [birth year]              Visit Date: 12/23/2019 01:57 pm ---------------------------------------------------------------------- Performed By  Attending:        12/25/2019 MD        Referred By:      [facility]                                                             Care  Performed By:     [facility]        [facility] ---------------------------------------------------------------------- Orders  #  Description  Code        Ordered By  1  [place] MFM OB LIMITED                     [id number]    Scheryl Darter ----------------------------------------------------------------------  #  Order #                     Accession #                Episode #  1  [id number]                   [id number]                 [id number] ---------------------------------------------------------------------- Indications  Premature rupture of membranes - leaking       O42.90  fluid  Medical complication of pregnancy (Adult       O26.90  chronic immune thrombocytopenic purpura)  Obesity complicating pregnancy, second         O99.212  trimester (BMI 31)  [redacted] weeks gestation of pregnancy                Z3A.24 ---------------------------------------------------------------------- Vital Signs                                                 Height:        5'4" ---------------------------------------------------------------------- Fetal Evaluation  Num Of Fetuses:         1  Fetal Heart Rate(bpm):  146  Cardiac Activity:       Observed  Presentation:           Cephalic  Placenta:               Anterior  P. Cord Insertion:      Visualized  Amniotic Fluid  AFI FV:      Anhydramnios                               Largest Pocket(cm)                              0 ---------------------------------------------------------------------- OB History  Gravidity:    1 ---------------------------------------------------------------------- Gestational Age  Best:          24w 5d     Det. ByMarcella Dubs         EDD:   04/08/20                                      (08/10/19) ---------------------------------------------------------------------- Impression  Patient was admitted with a diagnosis of PPROM.  She complains of decreased fetal movements.  A limited ultrasound study was performed .  No measurable  pocket of amniotic fluid was seen (anhydramnios).  Cephalic  presentation.  Good fetal activity was observed on ultrasound.  I reviewed the NST which is reassuring for this gestational  age. ---------------------------------------------------------------------- Recommendations  -Weekly limited ultrasound evaluation.  -BPP to begin at 28 to [redacted] weeks gestation. ----------------------------------------------------------------------                  Noralee Space, MD Electronically Signed Final Report  12/23/2019 02:15 pm ----------------------------------------------------------------------   Medications:  Scheduled . docusate sodium  100 mg Oral Daily  . ferrous sulfate  325 mg Oral Daily  . NIFEdipine  30 mg Oral BID  . pantoprazole  20 mg Oral Daily  . prenatal multivitamin  1 tablet Oral Q1200   I have reviewed the patient's current medications.  ASSESSMENT: Patient Active Problem List   Diagnosis Date Noted  . Preterm premature rupture of membranes (PPROM) in second trimester, antepartum 12/16/2019  . Preterm labor in second trimester 12/15/2019  . [redacted] weeks gestation of pregnancy 12/15/2019  . Supervision of high-risk pregnancy 11/25/2019  . UTI in pregnancy, antepartum, second trimester 10/27/2019  . Chronic ITP (idiopathic thrombocytopenia) (HCC) 10/27/2019  . Depression  affecting pregnancy in second trimester, antepartum 10/27/2019  . PCOS (polycystic ovarian syndrome) 02/10/2014    PLAN: Limited US showed good fetal movement, anhydramnios, cephalic. Repeat US 6 days  Scheryl Darter 12/24/2019,10:22 AM

## 2019-12-25 DIAGNOSIS — Z3A25 25 weeks gestation of pregnancy: Secondary | ICD-10-CM

## 2019-12-25 MED ORDER — NIFEDIPINE 10 MG PO CAPS
20.0000 mg | ORAL_CAPSULE | Freq: Three times a day (TID) | ORAL | Status: DC | PRN
  Filled 2019-12-25: qty 2

## 2019-12-25 NOTE — Progress Notes (Signed)
Pt states she feels lower abdominal cramps that radiate to her back and come and go about every 10 minutes. Notified Dr. Mayford Knife. Received new orders for procardia. Pt can have next dose at 1330. Will continue to monitor.

## 2019-12-25 NOTE — Progress Notes (Signed)
FACULTY PRACTICE ANTEPARTUM PROGRESS NOTE  Megan Fuller is a 34 y.o. G1P0 at [redacted]w[redacted]d who is admitted for PPROM.  Estimated Date of Delivery: 04/08/20 Fetal presentation is cephalic.  Length of Stay:  11 Days. Admitted 12/14/2019  Subjective: Pt seen.  She denies fever/chills or abdominal pain. Patient reports a small amount of fetal movement.  She notes irregular uterine contractions around q 20 minutes, denies bleeding. She notes a small amount of leaking of fluid per vagina.  Vitals:  Blood pressure 138/83, pulse (!) 118, temperature 98.3 F (36.8 C), temperature source Oral, resp. rate 18, height 5\' 4"  (1.626 m), weight 82.4 kg, SpO2 100 %. Physical Examination: CONSTITUTIONAL: Well-developed, well-nourished female in no acute distress.  HENT:  Normocephalic, atraumatic, External right and left ear normal.  EYES: Conjunctivae and EOM are normal. Pupils are equal, round, and reactive to light. No scleral icterus.  NECK: Normal range of motion, supple, no masses. SKIN: Skin is warm and dry. No rash noted. Not diaphoretic. No erythema. No pallor. NEUROLGIC: Alert and oriented to person, place, and time. Normal reflexes, muscle tone coordination. No cranial nerve deficit noted. PSYCHIATRIC: Normal mood and affect. Normal behavior. Normal judgment and thought content. MUSCULOSKELETAL: Normal range of motion. No edema and no tenderness. ABDOMEN: Soft, nontender, nondistended, gravid. CERVIX: deferred  Fetal monitoring: FHR: 135 bpm, Variability: moderate, Accelerations: Present, Decelerations: Absent  (1630 10/29) Uterine activity: irritability  Results for orders placed or performed during the hospital encounter of 12/14/19 (from the past 48 hour(s))  Type and screen [facility]     Status: None   Collection Time: 12/24/19  6:56 AM  Result Value Ref Range   ABO/RH(D) O POS    Antibody Screen NEG    Sample Expiration      12/27/2019,2359 Performed at [facility], [address] [place], [place]     I have reviewed the patient's current medications.  ASSESSMENT: Principal Problem:   Preterm premature rupture of membranes (PPROM) in second trimester, antepartum Active Problems:   PCOS (polycystic ovarian syndrome)   UTI in pregnancy, antepartum, second trimester   Chronic ITP (idiopathic thrombocytopenia) (HCC)   Depression affecting pregnancy in second trimester, antepartum   Supervision of high-risk pregnancy   Preterm labor in second trimester   [redacted] weeks gestation of pregnancy   PLAN: Continue inpatient care and monitor for infection Pt currently stable Continue daily NST, of note last platelets are at 28   Continue routine antenatal care.   46, MD [facility] Faculty Attending, Center for [facility] 12/25/2019 7:19 AM

## 2019-12-25 NOTE — Progress Notes (Addendum)
Pt declined procardia dose that was ordered. Pt stated she did not have pain any more and felt that she did not need to take the dose now. Pt stated she will let me know when she starts cramping again.

## 2019-12-26 ENCOUNTER — Other Ambulatory Visit: Payer: Self-pay

## 2019-12-26 ENCOUNTER — Encounter (HOSPITAL_COMMUNITY): Payer: Self-pay | Admitting: Family Medicine

## 2019-12-26 ENCOUNTER — Inpatient Hospital Stay (HOSPITAL_COMMUNITY): Payer: Medicaid Other | Admitting: Anesthesiology

## 2019-12-26 ENCOUNTER — Encounter (HOSPITAL_COMMUNITY)
Admission: AD | Disposition: A | Discharge status: Home / Self Care | Payer: Self-pay | Source: Home / Self Care | Attending: Obstetrics & Gynecology

## 2019-12-26 DIAGNOSIS — O99284 Endocrine, nutritional and metabolic diseases complicating childbirth: Secondary | ICD-10-CM

## 2019-12-26 DIAGNOSIS — D696 Thrombocytopenia, unspecified: Secondary | ICD-10-CM

## 2019-12-26 DIAGNOSIS — Z3A25 25 weeks gestation of pregnancy: Secondary | ICD-10-CM

## 2019-12-26 DIAGNOSIS — O4692 Antepartum hemorrhage, unspecified, second trimester: Secondary | ICD-10-CM

## 2019-12-26 DIAGNOSIS — O4693 Antepartum hemorrhage, unspecified, third trimester: Secondary | ICD-10-CM

## 2019-12-26 LAB — CBC
HCT: 26.6 % — ABNORMAL LOW (ref 36.0–46.0)
HCT: 30.2 % — ABNORMAL LOW (ref 36.0–46.0)
Hemoglobin: 9.3 g/dL — ABNORMAL LOW (ref 12.0–15.0)
Hemoglobin: 10.5 g/dL — ABNORMAL LOW (ref 12.0–15.0)
MCH: 34.6 pg — ABNORMAL HIGH (ref 26.0–34.0)
MCH: 35.1 pg — ABNORMAL HIGH (ref 26.0–34.0)
MCHC: 35 g/dL (ref 30.0–36.0)
MCHC: 34.8 g/dL (ref 30.0–36.0)
MCV: 98.9 fL (ref 80.0–100.0)
MCV: 101 fL — ABNORMAL HIGH (ref 80.0–100.0)
Platelets: 59 10*3/uL — ABNORMAL LOW (ref 150–400)
Platelets: 63 10*3/uL — ABNORMAL LOW (ref 150–400)
RBC: 2.69 MIL/uL — ABNORMAL LOW (ref 3.87–5.11)
RBC: 2.99 MIL/uL — ABNORMAL LOW (ref 3.87–5.11)
RDW: 13.5 % (ref 11.5–15.5)
RDW: 13.5 % (ref 11.5–15.5)
WBC: 7.1 10*3/uL (ref 4.0–10.5)
WBC: 7.6 10*3/uL (ref 4.0–10.5)
nRBC: 0 % (ref 0.0–0.2)
nRBC: 0 % (ref 0.0–0.2)

## 2019-12-26 LAB — PROTIME-INR
INR: 1.1 (ref 0.8–1.2)
Prothrombin Time: 14.1 seconds (ref 11.4–15.2)

## 2019-12-26 LAB — PREPARE RBC (CROSSMATCH)

## 2019-12-26 LAB — FIBRINOGEN: Fibrinogen: 350 mg/dL (ref 210–475)

## 2019-12-26 LAB — APTT: aPTT: 29 seconds (ref 24–36)

## 2019-12-26 SURGERY — Surgical Case
Anesthesia: General | Wound class: Clean Contaminated

## 2019-12-26 MED ORDER — SODIUM CHLORIDE 0.9% IV SOLUTION: Freq: Once | INTRAVENOUS | Status: DC

## 2019-12-26 MED ORDER — OXYCODONE HCL 5 MG PO TABS: 5.0000 mg | ORAL_TABLET | Freq: Once | ORAL | Status: DC | PRN

## 2019-12-26 MED ORDER — MEPERIDINE HCL 25 MG/ML IJ SOLN: 6.2500 mg | INTRAMUSCULAR | Status: DC | PRN

## 2019-12-26 MED ORDER — PROPOFOL 10 MG/ML IV BOLUS
INTRAVENOUS | Status: DC | PRN
  Administered 2019-12-26: 150 mg via INTRAVENOUS
  Administered 2019-12-26: 50 mg via INTRAVENOUS

## 2019-12-26 MED ORDER — FENTANYL CITRATE (PF) 100 MCG/2ML IJ SOLN
25.0000 ug | INTRAMUSCULAR | Status: DC | PRN
  Administered 2019-12-27: 50 ug via INTRAVENOUS
  Administered 2019-12-27 (×2): 25 ug via INTRAVENOUS

## 2019-12-26 MED ORDER — LACTATED RINGERS IV SOLN: INTRAVENOUS | Status: DC | PRN

## 2019-12-26 MED ORDER — OXYTOCIN-SODIUM CHLORIDE 30-0.9 UT/500ML-% IV SOLN
INTRAVENOUS | Status: DC | PRN
  Administered 2019-12-26: 30 [IU] via INTRAVENOUS

## 2019-12-26 MED ORDER — SODIUM CHLORIDE 0.9 % IV SOLN: INTRAVENOUS | Status: DC | PRN

## 2019-12-26 MED ORDER — SOD CITRATE-CITRIC ACID 500-334 MG/5ML PO SOLN: 30.0000 mL | ORAL | Status: DC

## 2019-12-26 MED ORDER — ACETAMINOPHEN 325 MG PO TABS: 325.0000 mg | ORAL_TABLET | ORAL | Status: DC | PRN

## 2019-12-26 MED ORDER — ACETAMINOPHEN 160 MG/5ML PO SOLN: 325.0000 mg | ORAL | Status: DC | PRN

## 2019-12-26 MED ORDER — LACTATED RINGERS IV SOLN: INTRAVENOUS | Status: DC

## 2019-12-26 MED ORDER — SODIUM CHLORIDE 0.9 % IV SOLN: 500.0000 mg | Freq: Once | INTRAVENOUS | Status: DC

## 2019-12-26 MED ORDER — PROPOFOL 10 MG/ML IV BOLUS
INTRAVENOUS | Status: AC
  Filled 2019-12-26: qty 20

## 2019-12-26 MED ORDER — FENTANYL CITRATE (PF) 100 MCG/2ML IJ SOLN
INTRAMUSCULAR | Status: DC | PRN
  Administered 2019-12-26: 150 ug via INTRAVENOUS
  Administered 2019-12-26 (×2): 100 ug via INTRAVENOUS

## 2019-12-26 MED ORDER — CEFAZOLIN SODIUM-DEXTROSE 2-4 GM/100ML-% IV SOLN
INTRAVENOUS | Status: AC
  Filled 2019-12-26: qty 100

## 2019-12-26 MED ORDER — PHENYLEPHRINE 40 MCG/ML (10ML) SYRINGE FOR IV PUSH (FOR BLOOD PRESSURE SUPPORT)
PREFILLED_SYRINGE | INTRAVENOUS | Status: AC
  Filled 2019-12-26: qty 10

## 2019-12-26 MED ORDER — CEFAZOLIN SODIUM-DEXTROSE 2-4 GM/100ML-% IV SOLN
2.0000 g | INTRAVENOUS | Status: AC
  Administered 2019-12-26: 2 g via INTRAVENOUS

## 2019-12-26 MED ORDER — MIDAZOLAM HCL 2 MG/2ML IJ SOLN
INTRAMUSCULAR | Status: AC
  Filled 2019-12-26: qty 2

## 2019-12-26 MED ORDER — DEXAMETHASONE SODIUM PHOSPHATE 10 MG/ML IJ SOLN
INTRAMUSCULAR | Status: DC | PRN
  Administered 2019-12-26: 4 mg via INTRAVENOUS

## 2019-12-26 MED ORDER — SODIUM CHLORIDE 0.9 % IV SOLN
INTRAVENOUS | Status: DC | PRN
  Administered 2019-12-26: 500 mg via INTRAVENOUS

## 2019-12-26 MED ORDER — FENTANYL CITRATE (PF) 100 MCG/2ML IJ SOLN
INTRAMUSCULAR | Status: AC
  Filled 2019-12-26: qty 2

## 2019-12-26 MED ORDER — LACTATED RINGERS IV BOLUS: 1000.0000 mL | Freq: Once | INTRAVENOUS | Status: DC

## 2019-12-26 MED ORDER — PHENYLEPHRINE HCL (PRESSORS) 10 MG/ML IV SOLN
INTRAVENOUS | Status: DC | PRN
  Administered 2019-12-26 (×2): 80 ug via INTRAVENOUS

## 2019-12-26 MED ORDER — FENTANYL CITRATE (PF) 250 MCG/5ML IJ SOLN
INTRAMUSCULAR | Status: AC
  Filled 2019-12-26: qty 5

## 2019-12-26 MED ORDER — ONDANSETRON HCL 4 MG/2ML IJ SOLN
INTRAMUSCULAR | Status: AC
  Filled 2019-12-26: qty 2

## 2019-12-26 MED ORDER — SODIUM CHLORIDE 0.9 % IR SOLN
Status: DC | PRN
  Administered 2019-12-26: 1000 mL

## 2019-12-26 MED ORDER — OXYCODONE HCL 5 MG/5ML PO SOLN: 5.0000 mg | Freq: Once | ORAL | Status: DC | PRN

## 2019-12-26 MED ORDER — ONDANSETRON HCL 4 MG/2ML IJ SOLN: 4.0000 mg | Freq: Once | INTRAMUSCULAR | Status: DC | PRN

## 2019-12-26 MED ORDER — ONDANSETRON HCL 4 MG/2ML IJ SOLN
INTRAMUSCULAR | Status: DC | PRN
  Administered 2019-12-26: 4 mg via INTRAVENOUS

## 2019-12-26 MED ORDER — SUCCINYLCHOLINE CHLORIDE 20 MG/ML IJ SOLN
INTRAMUSCULAR | Status: DC | PRN
  Administered 2019-12-26: 140 mg via INTRAVENOUS

## 2019-12-26 MED ORDER — DEXAMETHASONE SODIUM PHOSPHATE 4 MG/ML IJ SOLN
INTRAMUSCULAR | Status: AC
  Filled 2019-12-26: qty 1

## 2019-12-26 MED ORDER — SCOPOLAMINE 1 MG/3DAYS TD PT72
MEDICATED_PATCH | TRANSDERMAL | Status: DC | PRN
  Administered 2019-12-26: 1 via TRANSDERMAL

## 2019-12-26 MED ORDER — SODIUM CHLORIDE 0.9 % IV SOLN
INTRAVENOUS | Status: AC
  Filled 2019-12-26: qty 500

## 2019-12-26 SURGICAL SUPPLY — 41 items
APL SKNCLS STERI-STRIP NONHPOA (GAUZE/BANDAGES/DRESSINGS) ×1
BENZOIN TINCTURE PRP APPL 2/3 (GAUZE/BANDAGES/DRESSINGS) ×3 IMPLANT
CANISTER SUCT 3000ML PPV (MISCELLANEOUS) ×3 IMPLANT
CHLORAPREP W/TINT 26ML (MISCELLANEOUS) ×3 IMPLANT
CLOSURE WOUND 1/2 X4 (GAUZE/BANDAGES/DRESSINGS) ×1
DRSG OPSITE POSTOP 4X10 (GAUZE/BANDAGES/DRESSINGS) ×3 IMPLANT
ELECT REM PT RETURN 9FT ADLT (ELECTROSURGICAL) ×3
ELECTRODE REM PT RTRN 9FT ADLT (ELECTROSURGICAL) ×1 IMPLANT
EXTRACTOR VACUUM KIWI (MISCELLANEOUS) ×3 IMPLANT
GLOVE BIOGEL PI IND STRL 7.0 (GLOVE) ×2 IMPLANT
GLOVE BIOGEL PI IND STRL 7.5 (GLOVE) ×1 IMPLANT
GLOVE BIOGEL PI INDICATOR 7.0 (GLOVE) ×4
GLOVE BIOGEL PI INDICATOR 7.5 (GLOVE) ×2
GLOVE SKINSENSE NS SZ7.0 (GLOVE) ×2
GLOVE SKINSENSE STRL SZ7.0 (GLOVE) ×1 IMPLANT
GOWN STRL REUS W/ TWL LRG LVL3 (GOWN DISPOSABLE) ×2 IMPLANT
GOWN STRL REUS W/ TWL XL LVL3 (GOWN DISPOSABLE) ×1 IMPLANT
GOWN STRL REUS W/TWL LRG LVL3 (GOWN DISPOSABLE) ×6
GOWN STRL REUS W/TWL XL LVL3 (GOWN DISPOSABLE) ×3
NS IRRIG 1000ML POUR BTL (IV SOLUTION) ×3 IMPLANT
PACK C SECTION WH (CUSTOM PROCEDURE TRAY) ×3 IMPLANT
PAD ABD 7.5X8 STRL (GAUZE/BANDAGES/DRESSINGS) ×3 IMPLANT
PAD ABD 8X10 STRL (GAUZE/BANDAGES/DRESSINGS) ×2 IMPLANT
PAD OB MATERNITY 4.3X12.25 (PERSONAL CARE ITEMS) ×3 IMPLANT
PAD PREP 24X48 CUFFED NSTRL (MISCELLANEOUS) ×3 IMPLANT
PENCIL SMOKE EVAC W/HOLSTER (ELECTROSURGICAL) ×3 IMPLANT
SPONGE GAUZE 4X4 12PLY STER LF (GAUZE/BANDAGES/DRESSINGS) ×4 IMPLANT
STAPLER VISISTAT 35W (STAPLE) ×2 IMPLANT
STRIP CLOSURE SKIN 1/2X4 (GAUZE/BANDAGES/DRESSINGS) ×2 IMPLANT
SUT MNCRL 0 VIOLET CTX 36 (SUTURE) ×2 IMPLANT
SUT MON AB 4-0 PS1 27 (SUTURE) ×3 IMPLANT
SUT MONOCRYL 0 CTX 36 (SUTURE) ×6
SUT PDS AB 0 CTX 36 PDP370T (SUTURE) ×2 IMPLANT
SUT PDS AB 0 CTX 60 (SUTURE) ×6 IMPLANT
SUT PLAIN 2 0 XLH (SUTURE) ×5 IMPLANT
SUT VIC AB 0 CT1 36 (SUTURE) ×6 IMPLANT
SUT VIC AB 3-0 CT1 27 (SUTURE) ×3
SUT VIC AB 3-0 CT1 TAPERPNT 27 (SUTURE) ×1 IMPLANT
TAPE MEDIFIX FOAM 3 (GAUZE/BANDAGES/DRESSINGS) ×2 IMPLANT
TOWEL OR 17X24 6PK STRL BLUE (TOWEL DISPOSABLE) ×6 IMPLANT
WATER STERILE IRR 1000ML POUR (IV SOLUTION) ×3 IMPLANT

## 2019-12-26 NOTE — Progress Notes (Signed)
FACULTY PRACTICE ANTEPARTUM COMPREHENSIVE PROGRESS NOTE  Megan Fuller is a 34 y.o. G1P0 at [redacted]w[redacted]d who is admitted for Preterm labor, now with PPROM this early morning.  Estimated Date of Delivery: 04/08/20 Fetal presentation is cephalic.  Length of Stay:  12 Days. Admitted 12/14/2019  Subjective: Patient is Spanish-speaking only, [employer] interpreter used for this encounter. She reports no current complaints. Patient reports good fetal movement.  She reports not feeling uterine contractions, no bleeding and no current loss of fluid per vagina. RN is concerned about frequent contractions noted on tocometer.  Vitals:  Blood pressure 107/60, pulse 96, temperature 98.3 F (36.8 C), temperature source Oral, resp. rate 18, height 5\' 4"  (1.626 m), weight 82.4 kg, SpO2 99 %. Physical Examination: CONSTITUTIONAL: Well-developed, well-nourished female in no acute distress.  NEUROLGIC: Alert and oriented to person, place, and time. Normal reflexes, muscle tone coordination. No cranial nerve deficit noted. PSYCHIATRIC: Normal mood and affect. Normal behavior. Normal judgment and thought content. CARDIOVASCULAR: Normal heart rate noted, regular rhythm RESPIRATORY: Effort and breath sounds normal, no problems with respiration noted MUSCULOSKELETAL: Normal range of motion. No edema and no tenderness. 2+ distal pulses. ABDOMEN: Soft, nontender, nondistended, gravid. CERVIX: Dilation: 2.5 Effacement (%): 90 Station: -2 Presentation: Vertex Exam by:: Dr. [email or web address] last week Bedside ultrasound confirmed cephalic presentation.  Fetal monitoring: FHR: 140 bpm, Variability: moderate, Accelerations: 10 x 10, Decelerations: none Uterine activity: rare  CBC Latest Ref Rng & Units 12/26/2019 12/23/2019 12/20/2019  WBC 4.0 - 10.5 K/uL 7.1 8.1 7.5  Hemoglobin 12.0 - 15.0 g/dL 12/22/2019) 0.0(L) 4.9(Z)  Hematocrit 36 - 46 % 26.6(L) 27.1(L) 27.5(L)  Platelets 150 - 400 K/uL 59(L) 59(L) 61(L)   No results  found.  Current scheduled medications . docusate sodium  100 mg Oral Daily  . ferrous sulfate  325 mg Oral Daily  . pantoprazole  20 mg Oral Daily  . prenatal multivitamin  1 tablet Oral Q1200    I have reviewed the patient's current medications.  ASSESSMENT: Principal Problem:   Preterm premature rupture of membranes (PPROM) in second trimester, antepartum Active Problems:   PCOS (polycystic ovarian syndrome)   UTI in pregnancy, antepartum, second trimester   Chronic ITP (idiopathic thrombocytopenia) (HCC)   Depression affecting pregnancy in second trimester, antepartum   Supervision of high-risk pregnancy   Preterm labor in second trimester   [redacted] weeks gestation of pregnancy   PLAN: Procardia XL 30 bid for contractions On latency antibiotics now Heme Onc (Dr. 7.9(X) already curbsided, she feels current platelet count is ~60K is her baseline, no bleeding, no intervention needed for now.  Will reconsult as needed. Category 1 FHR tracing for GA, GBS pending.  Appreciate NICU consult. She has received BMZ x 2 doses. Zoloft 50 mg po daily for depression Continue close observation and routine antenatal care.   Bertis Ruddy, MD, FACOG Obstetrician & Gynecologist, [facility] for [facility]

## 2019-12-26 NOTE — Anesthesia Preprocedure Evaluation (Signed)
Anesthesia Evaluation  Patient identified by MRN, date of birth, ID band Patient awake    Reviewed: Allergy & Precautions, H&P , NPO status , Patient's Chart, lab work & pertinent test results, reviewed documented beta blocker date and time   Airway Mallampati: I  TM Distance: >3 FB Neck ROM: full    Dental no notable dental hx. (+) Teeth Intact, Dental Advisory Given   Pulmonary neg pulmonary ROS,    Pulmonary exam normal breath sounds clear to auscultation       Cardiovascular negative cardio ROS Normal cardiovascular exam Rhythm:regular Rate:Normal     Neuro/Psych PSYCHIATRIC DISORDERS Depression negative neurological ROS     GI/Hepatic negative GI ROS, Neg liver ROS,   Endo/Other  negative endocrine ROS  Renal/GU negative Renal ROS  negative genitourinary   Musculoskeletal   Abdominal   Peds  Hematology  (+) Blood dyscrasia, anemia ,   Anesthesia Other Findings   Reproductive/Obstetrics (+) Pregnancy                             Anesthesia Physical Anesthesia Plan  ASA: III and emergent  Anesthesia Plan: General   Post-op Pain Management:    Induction: Cricoid pressure planned and Rapid sequence  PONV Risk Score and Plan: 3 and Ondansetron, Dexamethasone and Treatment may vary due to age or medical condition  Airway Management Planned: Oral ETT  Additional Equipment:   Intra-op Plan:   Post-operative Plan:   Informed Consent: I have reviewed the patients History and Physical, chart, labs and discussed the procedure including the risks, benefits and alternatives for the proposed anesthesia with the patient or authorized representative who has indicated his/her understanding and acceptance.       Plan Discussed with: Anesthesiologist and CRNA  Anesthesia Plan Comments: (  Idiopathic thrombocytopenia  )        Anesthesia Quick Evaluation

## 2019-12-26 NOTE — Progress Notes (Signed)
Operative Note   SURGERY DATE: 12/26/2019  PRE-OP DIAGNOSIS:  *Pregnancy at 25/1 *Worsening vaginal bleeding *Preterm labor in early labor (2cm) *Chronic idiopathic thrombocytopenia  POST-OP DIAGNOSIS: Same. Delivered   PROCEDURE: Urgent primary low transverse cesarean section via vertical midline skin incision with double layer uterine closure  SURGEON: Surgeon(s) and Role:    * Osher Oettinger, MD - Primary  ASSISTANT: None  ANESTHESIA: general  ESTIMATED BLOOD LOSS: 333mL  DRAINS: 400mL UOP via indwelling foley  TOTAL IV FLUIDS: 1000mL crystalloid  VTE PROPHYLAXIS: SCDs to bilateral lower extremities  ANTIBIOTICS: Two grams of Cefazolin were given., within 1 hour of skin incision and azithromycin 500mg IV x 1 given after  SPECIMENS: placenta and arterial and venous cord gases  COMPLICATIONS: Due to most recent platelets at 59, decision made to do general anesthesia and to do a vertical midline skin incision, to use a more avascular plane  FINDINGS: No intra-abdominal adhesions were noted. Grossly normal uterus, tubes and ovaries. No amniotic fluid, cephalic, female infant, weight 660gm, APGARs 4/6/7, intact placenta. Results for PINEDA-LOVO, BOY Eternity (MRN [id number]) as of 12/28/2019 08:50  Ref. Range 12/26/2019 23:40 12/26/2019 23:40  pH cord blood (arterial) Latest Ref Range: 7.210 - 7.380  7.278   pCO2 cord blood (arterial) Latest Ref Range: 42.0 - 56.0 mmHg 48.6   Bicarbonate Latest Ref Range: 13.0 - 22.0 mmol/L 22.0 22.3 (H)  Ph Cord Blood (Venous) Latest Ref Range: 7.240 - 7.380   7.274  pCO2 Cord Blood (Venous) Latest Ref Range: 42.0 - 56.0   49.8    PROCEDURE IN DETAIL: The patient was taken to the operating room where she was then prepped and draped in the normal fashion in the dorsal supine position with a leftward tilt.  After a time out was performed, she underwent general anesthesia.  A vertical midline skin incision was made with the  scalpel and carried through to the underlying layer of fascia. The fascia was then incised at the midline and this incision was extended vertically with the mayo scissors.  The rectus muscles were then separated in the midline and the peritoneum was entered bluntly.  The Alexis retractor was then placed.   A low transverse hysterotomy was made with the scalpel until the endometrial cavity was breached and the amniotic sac entered. This incision was extended bluntly and the infant's head, shoulders and body were delivered atraumatically.The cord was clamped x 2 and cut, and the infant was handed to the awaiting pediatricians, after delayed cord clamping was not done.  The placenta was then gradually expressed from the uterus and then the uterus was exteriorized and cleared of all clots and debris. The hysterotomy was repaired with a running suture of 1-0 monocryl. A second imbricating layer of 1-0 monocryl suture was then placed to achieve excellent hemostasis.   The uterus and adnexa were then returned to the abdomen, and the hysterotomy and all operative sites were reinspected and excellent hemostasis was noted after irrigation and suction of the abdomen with warm saline.  The fascia was then closed with looped 0-PDS from both ends and tied in the middleThe subcutaneous layer was then reapproximated with interrupted sutures of 2-0 plain gut, and the skin was then closed with staples.   The patient  tolerated the procedure well. Sponge, lap, needle, and instrument counts were correct x 2. The patient was transferred to the recovery room awake, alert and breathing independently in stable condition.  Deran Barro, Jr. MD [facility]ending [facility] Fish farm manager)

## 2019-12-26 NOTE — Progress Notes (Signed)
Placed on EFM because of c/o low back and abdominal pain, which she rated 6 on 0-10 scale. After monitoring for about 30 minutes, she stated pain was gone and indeed, no contractions were noted on EFM tracing. Instructed to notify RN if pain returns.

## 2019-12-26 NOTE — Progress Notes (Signed)
OB Note CTSP patient for bleeding, pain and difficulty finding FHTs  S: pt feeling q61m UCs since 1600 AF VS normal and stable NAD SSE: approximately 24mL of blood/watery d/c in vault, no active bleeding. Visually 1cm SVE: 1.5/80/-1/cephalic  Bedside u/s: normal FHR in the 140s, cephalic  A/P: PTL vs abruption Transfer to L&D; continuous EFM; rpt cbc, coags, fibrinogen, t&s UTD; 1L IVF bolus, NPO and MIVF. Hold off on ordering platelets for now  Pt is already s/p bmz and mg on admission.   D/w pt and she is amenable to transfusion PRN  Cornelia Copa MD Attending Center for [facility] (Faculty Practice) 12/26/2019 Time: 2106

## 2019-12-26 NOTE — Progress Notes (Addendum)
OB Note CTSP for more VB  When she got up to go to the bathroom, she was passing more clots. Difficulty finding baby on efm Bedside u/s with FHR in the 140s-150s SVE: 2/80/-1 with half dollar sized clots removed on exam  D/w her that given she keeps bleeding and passing clots, I recommend urgent c/s. Labs are not back. RN states they were drawn. I also told her I recommend blood products. Will transfuse two units of platelets and have another two on hold as well as two units of blood on hold  Last PO at 1900. Anesthesia and OR aware  Interpreter used  [place] Bing, Montez Hageman MD Attending Center for [employer] (Faculty Practice) 12/26/2019 Time: 2226

## 2019-12-27 LAB — CBC
HCT: 28.7 % — ABNORMAL LOW (ref 36.0–46.0)
Hemoglobin: 10 g/dL — ABNORMAL LOW (ref 12.0–15.0)
MCH: 35 pg — ABNORMAL HIGH (ref 26.0–34.0)
MCHC: 34.8 g/dL (ref 30.0–36.0)
MCV: 100.3 fL — ABNORMAL HIGH (ref 80.0–100.0)
Platelets: 58 10*3/uL — ABNORMAL LOW (ref 150–400)
RBC: 2.86 MIL/uL — ABNORMAL LOW (ref 3.87–5.11)
RDW: 13.5 % (ref 11.5–15.5)
WBC: 10 10*3/uL (ref 4.0–10.5)
nRBC: 0 % (ref 0.0–0.2)

## 2019-12-27 LAB — PREPARE PLATELET PHERESIS: Unit division: 0

## 2019-12-27 LAB — BPAM PLATELET PHERESIS
Blood Product Expiration Date: 202111022359
ISSUE DATE / TIME: 202110312229
Unit Type and Rh: 6200

## 2019-12-27 MED ORDER — COCONUT OIL OIL
1.0000 "application " | TOPICAL_OIL | Status: DC | PRN
  Administered 2019-12-27: 1 via TOPICAL

## 2019-12-27 MED ORDER — NALOXONE HCL 0.4 MG/ML IJ SOLN: 0.4000 mg | INTRAMUSCULAR | Status: DC | PRN

## 2019-12-27 MED ORDER — ONDANSETRON HCL 4 MG/2ML IJ SOLN: 4.0000 mg | Freq: Four times a day (QID) | INTRAMUSCULAR | Status: DC | PRN

## 2019-12-27 MED ORDER — HYDROMORPHONE HCL 1 MG/ML IJ SOLN
INTRAMUSCULAR | Status: AC
  Filled 2019-12-27: qty 0.5

## 2019-12-27 MED ORDER — OXYCODONE HCL 5 MG PO TABS: 5.0000 mg | ORAL_TABLET | Freq: Once | ORAL | Status: DC | PRN

## 2019-12-27 MED ORDER — SODIUM CHLORIDE 0.9% FLUSH: 9.0000 mL | INTRAVENOUS | Status: DC | PRN

## 2019-12-27 MED ORDER — POLYETHYLENE GLYCOL 3350 17 G PO PACK
17.0000 g | PACK | Freq: Every day | ORAL | Status: DC
  Administered 2019-12-27 – 2019-12-28 (×2): 17 g via ORAL
  Filled 2019-12-27 (×2): qty 1

## 2019-12-27 MED ORDER — IBUPROFEN 600 MG PO TABS
600.0000 mg | ORAL_TABLET | Freq: Four times a day (QID) | ORAL | Status: DC
  Administered 2019-12-28 – 2019-12-29 (×4): 600 mg via ORAL
  Filled 2019-12-27 (×4): qty 1

## 2019-12-27 MED ORDER — TETANUS-DIPHTH-ACELL PERTUSSIS 5-2.5-18.5 LF-MCG/0.5 IM SUSY
0.5000 mL | PREFILLED_SYRINGE | Freq: Once | INTRAMUSCULAR | Status: AC
  Administered 2019-12-28: 0.5 mL via INTRAMUSCULAR
  Filled 2019-12-27: qty 0.5

## 2019-12-27 MED ORDER — SENNOSIDES-DOCUSATE SODIUM 8.6-50 MG PO TABS
2.0000 | ORAL_TABLET | Freq: Every evening | ORAL | Status: DC | PRN
  Filled 2019-12-27: qty 2

## 2019-12-27 MED ORDER — ACETAMINOPHEN 160 MG/5ML PO SOLN: 325.0000 mg | ORAL | Status: DC | PRN

## 2019-12-27 MED ORDER — DIPHENHYDRAMINE HCL 12.5 MG/5ML PO ELIX: 12.5000 mg | ORAL_SOLUTION | Freq: Four times a day (QID) | ORAL | Status: DC | PRN

## 2019-12-27 MED ORDER — FENTANYL CITRATE (PF) 100 MCG/2ML IJ SOLN: 25.0000 ug | INTRAMUSCULAR | Status: DC | PRN

## 2019-12-27 MED ORDER — LACTATED RINGERS IV SOLN: INTRAVENOUS | Status: DC

## 2019-12-27 MED ORDER — SIMETHICONE 80 MG PO CHEW
80.0000 mg | CHEWABLE_TABLET | Freq: Three times a day (TID) | ORAL | Status: DC
  Administered 2019-12-27 – 2019-12-29 (×7): 80 mg via ORAL
  Filled 2019-12-27 (×8): qty 1

## 2019-12-27 MED ORDER — OXYTOCIN-SODIUM CHLORIDE 30-0.9 UT/500ML-% IV SOLN
2.5000 [IU]/h | INTRAVENOUS | Status: AC
  Administered 2019-12-27 (×2): 2.5 [IU]/h via INTRAVENOUS
  Filled 2019-12-27: qty 500

## 2019-12-27 MED ORDER — WITCH HAZEL-GLYCERIN EX PADS: 1.0000 "application " | MEDICATED_PAD | CUTANEOUS | Status: DC | PRN

## 2019-12-27 MED ORDER — DIBUCAINE (PERIANAL) 1 % EX OINT: 1.0000 "application " | TOPICAL_OINTMENT | CUTANEOUS | Status: DC | PRN

## 2019-12-27 MED ORDER — ACETAMINOPHEN 325 MG PO TABS
650.0000 mg | ORAL_TABLET | Freq: Four times a day (QID) | ORAL | Status: DC
  Administered 2019-12-28 – 2019-12-29 (×4): 650 mg via ORAL
  Filled 2019-12-27 (×5): qty 2

## 2019-12-27 MED ORDER — ONDANSETRON HCL 4 MG/2ML IJ SOLN: 4.0000 mg | Freq: Once | INTRAMUSCULAR | Status: DC | PRN

## 2019-12-27 MED ORDER — DIPHENHYDRAMINE HCL 25 MG PO CAPS: 25.0000 mg | ORAL_CAPSULE | Freq: Four times a day (QID) | ORAL | Status: DC | PRN

## 2019-12-27 MED ORDER — HYDROMORPHONE 1 MG/ML IV SOLN
INTRAVENOUS | Status: DC
  Administered 2019-12-27: 30 mg via INTRAVENOUS
  Filled 2019-12-27: qty 30

## 2019-12-27 MED ORDER — MEPERIDINE HCL 25 MG/ML IJ SOLN: 6.2500 mg | INTRAMUSCULAR | Status: DC | PRN

## 2019-12-27 MED ORDER — ACETAMINOPHEN 325 MG PO TABS: 325.0000 mg | ORAL_TABLET | ORAL | Status: DC | PRN

## 2019-12-27 MED ORDER — DIPHENHYDRAMINE HCL 50 MG/ML IJ SOLN: 12.5000 mg | Freq: Four times a day (QID) | INTRAMUSCULAR | Status: DC | PRN

## 2019-12-27 MED ORDER — OXYCODONE HCL 5 MG/5ML PO SOLN: 5.0000 mg | Freq: Once | ORAL | Status: DC | PRN

## 2019-12-27 MED ORDER — FENTANYL CITRATE (PF) 100 MCG/2ML IJ SOLN
INTRAMUSCULAR | Status: AC
  Filled 2019-12-27: qty 2

## 2019-12-27 MED ORDER — GABAPENTIN 100 MG PO CAPS
100.0000 mg | ORAL_CAPSULE | Freq: Two times a day (BID) | ORAL | Status: DC
  Administered 2019-12-27 – 2019-12-29 (×6): 100 mg via ORAL
  Filled 2019-12-27 (×6): qty 1

## 2019-12-27 MED ORDER — PRENATAL MULTIVITAMIN CH
1.0000 | ORAL_TABLET | Freq: Every day | ORAL | Status: DC
  Administered 2019-12-27 – 2019-12-28 (×2): 1 via ORAL
  Filled 2019-12-27 (×2): qty 1

## 2019-12-27 MED ORDER — OXYCODONE-ACETAMINOPHEN 5-325 MG PO TABS
1.0000 | ORAL_TABLET | Freq: Four times a day (QID) | ORAL | Status: DC | PRN
  Administered 2019-12-27 – 2019-12-28 (×3): 2 via ORAL
  Filled 2019-12-27 (×2): qty 1
  Filled 2019-12-27 (×2): qty 2

## 2019-12-27 MED ORDER — HYDROMORPHONE HCL 1 MG/ML IJ SOLN
0.5000 mg | Freq: Once | INTRAMUSCULAR | Status: AC
  Administered 2019-12-27: 0.5 mg via INTRAVENOUS

## 2019-12-27 MED ORDER — MENTHOL 3 MG MT LOZG: 1.0000 | LOZENGE | OROMUCOSAL | Status: DC | PRN

## 2019-12-27 NOTE — Transfer of Care (Signed)
Immediate Anesthesia Transfer of Care Note  Patient: Megan Fuller  Procedure(s) Performed: CESAREAN SECTION (N/A )  Patient Location: PACU  Anesthesia Type:General  Level of Consciousness: awake, alert  and oriented  Airway & Oxygen Therapy: Patient Spontanous Breathing and Patient connected to nasal cannula oxygen  Post-op Assessment: Report given to RN and Post -op Vital signs reviewed and stable  Post vital signs: Reviewed and stable  Last Vitals:  Vitals Value Taken Time  BP    Temp    Pulse    Resp    SpO2      Last Pain:  Vitals:   12/26/19 2158  TempSrc:   PainSc: 0-No pain      Patients Stated Pain Goal: 0 (12/16/19 1800)  Complications: No complications documented.

## 2019-12-27 NOTE — Anesthesia Postprocedure Evaluation (Signed)
Anesthesia Post Note  Patient: Megan Fuller  Procedure(s) Performed: CESAREAN SECTION (N/A )     Patient location during evaluation: PACU Anesthesia Type: General Level of consciousness: awake and alert Pain management: pain level controlled Vital Signs Assessment: post-procedure vital signs reviewed and stable Respiratory status: spontaneous breathing, nonlabored ventilation, respiratory function stable and patient connected to nasal cannula oxygen Cardiovascular status: blood pressure returned to baseline and stable Postop Assessment: no apparent nausea or vomiting Anesthetic complications: no   No complications documented.  Last Vitals:  Vitals:   12/26/19 1955 12/26/19 2157  BP: 121/73   Pulse: (!) 101 92  Resp: 17 17  Temp: 36.8 C 37.2 C  SpO2: 100% 100%    Last Pain:  Vitals:   12/26/19 2158  TempSrc:   PainSc: 0-No pain   Pain Goal: Patients Stated Pain Goal: 0 (12/16/19 1800)                 Lakeia Bradshaw

## 2019-12-27 NOTE — Progress Notes (Signed)
Dr Vergie Living at bedside evaluation complete, cervical exam 2.5 with active bleeding noted; MD explaining per spanish interrupter that needs to have emergency c-section. Jose-SO at bedside and helping pt decide for section. Pt verbalized with interrupter consent for cesarean section and spoke in english "ok" for csection and to receive blood if needed. Starting prep for surgery

## 2019-12-27 NOTE — Lactation Note (Signed)
This note was copied from a baby's chart. Lactation Consultation Note  Patient Name: Megan Fuller [id number] Date: 12/27/2019 Reason for consult: Initial assessment;1st time breastfeeding;Preterm <34wks;NICU baby P1, premature infant 25 weeks 2 days in NICU. Mom receives [facility] in [place]. LC discussed hand expression and mom expressed few drops of colostrum in a bullet that dad will take to NICU. Mom understands to pump every 3 hours for 15 minutes on initial setting and hand expressed after to help establish her milk supply.  Mom shown how to use DEBP & how to disassemble, clean, & reassemble parts. Mom will follow NICU infant feeding polices and guidelines. Mom understands to call [facility] services if she has any breastfeeding questions or concerns. Mom made aware of O/P services, breastfeeding support groups, community resources, and our phone # for post-discharge questions.   Maternal Data Formula Feeding for Exclusion: Yes Reason for exclusion: Mother's choice to formula and breast feed on admission Has patient been taught Hand Expression?: Yes Does the patient have breastfeeding experience prior to this delivery?: No  Feeding    LATCH Score                   Interventions Interventions: Breast feeding basics reviewed;DEBP;Hand express  Lactation Tools Discussed/Used WIC Program: Yes Pump Review: Setup, frequency, and cleaning;Milk Storage Initiated by:: Danelle Earthly, IBCLC Date initiated:: 12/27/19   Consult Status Consult Status: Follow-up Date: 12/27/19 Follow-up type: In-patient    Danelle Earthly 12/27/2019, 2:35 AM

## 2019-12-27 NOTE — Progress Notes (Signed)
Pt taken  to NICU to see newborn per pt request. Pt advised to stay sitting in wheelchair while in NICU due to safety. Pt verbalized understanding.

## 2019-12-27 NOTE — Progress Notes (Signed)
Subjective: Postpartum Day 1: Cesarean Delivery Patient reports feeling well with some incisional pain well controlled with pain medication. Patient denies nausea/emesis. She is tolerating a regular diet.    Objective: Vital signs in last 24 hours: Temp:  [97.8 F (36.6 C)-99 F (37.2 C)] 98.3 F (36.8 C) (11/01 0753) Pulse Rate:  [71-118] 88 (11/01 0753) Resp:  [13-24] 16 (11/01 0949) BP: (121-149)/(73-100) 137/86 (11/01 0753) SpO2:  [98 %-100 %] 99 % (11/01 0949) Weight:  [82.4 kg] 82.4 kg (10/31 2157)  Physical Exam:  General: alert, cooperative and no distress Lochia: appropriate Uterine Fundus: firm Incision: honey comb dressing clean dry and intact DVT Evaluation: No evidence of DVT seen on physical exam.  Recent Labs    12/26/19 2136 12/27/19 0428  HGB 10.5* 10.0*  HCT 30.2* 28.7*    Assessment/Plan: Status post Cesarean section. Doing well postoperatively.  Continue current care.  Jonathin Heinicke 12/27/2019, 10:35 AM

## 2019-12-27 NOTE — Progress Notes (Signed)
Pt requesting to go to the BR; assisted pt up and pt starting active vaginal bleeding and c/o pain, instructed to get back in bed; assisted back to bed, noted small clot and bright red bleeding to pad; call made to MD to come an eval. Gave pt bedpan, pt able to void x 1. [facility] aware of possible delivery; came to set up for delivery.

## 2019-12-27 NOTE — Progress Notes (Signed)
Dr. Vergie Living notified @0200  of saturated honeycomb dressing and bloody pressure dressing over patient's c-section wound. RN requested provider to assess incision and bleeding. Dr. stated he would come assess patient later.   RN held 0230 Q1hr fundal assessment until provider assessed patient. RN initiated pain medication for patient.  RN held 0330 Q1hr fundal assessment until provider assessed patient.   RN followed up with Dr. Vergie Living @0335 Vergie Living. RN stated concerns about rubbing patient fundus with patient bleeding. Orders received to change honeycomb and pressure dressings, preform one more fundal rub, continue assessing patients bleeding with new dressings as baseline. Dr. stated he would assess patient on morning rounds.  Dressing changed @0400 . Honeycomb dressing >70% saturated, bleeding on pressure dressing in two areas w/ about 5cm by 5cm spots noted not fully saturated, no active bleeding from incision at time of dressing change. Patient fundal assessment preform after new honeycomb and pressure dressing in place. RN unable to visualize if patient bleeding on honeycomb w/ pressure dressing in place.

## 2019-12-27 NOTE — Progress Notes (Signed)
I called the Blood Bank and since surgery went so well, will only keep 1U PRBC and 2U plts on hold for now.  Cornelia Copa MD Attending Center for [employer] (Faculty Practice) 12/27/2019 Time: 1201am

## 2019-12-27 NOTE — Anesthesia Procedure Notes (Signed)
Procedure Name: Intubation Date/Time: 12/26/2019 10:31 PM Performed by: Armanda Heritage, CRNA Pre-anesthesia Checklist: Patient identified, Emergency Drugs available, Suction available, Patient being monitored and Timeout performed Patient Re-evaluated:Patient Re-evaluated prior to induction Oxygen Delivery Method: Circle system utilized Preoxygenation: Pre-oxygenation with 100% oxygen Induction Type: IV induction and Rapid sequence Laryngoscope Size: Glidescope and 3 Grade View: Grade I Number of attempts: 1 Airway Equipment and Method: Stylet Placement Confirmation: ETT inserted through vocal cords under direct vision Secured at: 22 cm Dental Injury: Teeth and Oropharynx as per pre-operative assessment

## 2019-12-27 NOTE — Addendum Note (Signed)
Addendum  created 12/27/19 0035 by Bethena Midget, MD   Order list changed

## 2019-12-27 NOTE — Progress Notes (Signed)
RN called to patient room @2040  for vaginal bleeding. RN assessed wash cloth saturated in bright red blood, dine-quarter clots noted on wash cloth, quarter sized clot noted in toilet. RN assisted patient to bed and placed patient on FHM but was unable to determine if heart rate tracing was maternal or fetal. Dr. notified @2050  of vaginal bleeding and FHR tracing. Orders received to prepare bedside ultrasound for provider assessment. Dr. Vergie Living performed a bedside ultrasound @2055 . Positive fetal heart tones noted. Vaginal exam performed by Dr. , see chart for dilation. Orders received to transfer patient to labor and delivery, start Vergie Living Lactated Ringers bolus, and place lab orders. LR started bolus started on OBSC. Patient to labor and delivery @2130 .

## 2019-12-28 ENCOUNTER — Encounter (HOSPITAL_COMMUNITY): Payer: Self-pay | Admitting: Obstetrics and Gynecology

## 2019-12-28 LAB — TYPE AND SCREEN
ABO/RH(D): O POS
Antibody Screen: NEGATIVE
Unit division: 0
Unit division: 0

## 2019-12-28 LAB — COMPREHENSIVE METABOLIC PANEL
ALT: 24 U/L (ref 0–44)
AST: 55 U/L — ABNORMAL HIGH (ref 15–41)
Albumin: 2.1 g/dL — ABNORMAL LOW (ref 3.5–5.0)
Alkaline Phosphatase: 94 U/L (ref 38–126)
Anion gap: 8 (ref 5–15)
BUN: 6 mg/dL (ref 6–20)
CO2: 23 mmol/L (ref 22–32)
Calcium: 8.6 mg/dL — ABNORMAL LOW (ref 8.9–10.3)
Chloride: 108 mmol/L (ref 98–111)
Creatinine, Ser: 0.66 mg/dL (ref 0.44–1.00)
GFR, Estimated: >60 mL/min (ref >60)
Glucose, Bld: 66 mg/dL — ABNORMAL LOW (ref 70–99)
Potassium: 3.8 mmol/L (ref 3.5–5.1)
Sodium: 139 mmol/L (ref 135–145)
Total Bilirubin: 1.4 mg/dL — ABNORMAL HIGH (ref 0.3–1.2)
Total Protein: 5.1 g/dL — ABNORMAL LOW (ref 6.5–8.1)

## 2019-12-28 LAB — BPAM RBC
Blood Product Expiration Date: 202112042359
Blood Product Expiration Date: 202112042359
ISSUE DATE / TIME: 202110312227
ISSUE DATE / TIME: 202110312227
Unit Type and Rh: 5100
Unit Type and Rh: 5100

## 2019-12-28 LAB — CBC
HCT: 26.2 % — ABNORMAL LOW (ref 36.0–46.0)
Hemoglobin: 8.7 g/dL — ABNORMAL LOW (ref 12.0–15.0)
MCH: 34 pg (ref 26.0–34.0)
MCHC: 33.2 g/dL (ref 30.0–36.0)
MCV: 102.3 fL — ABNORMAL HIGH (ref 80.0–100.0)
Platelets: 60 10*3/uL — ABNORMAL LOW (ref 150–400)
RBC: 2.56 MIL/uL — ABNORMAL LOW (ref 3.87–5.11)
RDW: 14.2 % (ref 11.5–15.5)
WBC: 7.5 10*3/uL (ref 4.0–10.5)
nRBC: 0 % (ref 0.0–0.2)

## 2019-12-28 LAB — SURGICAL PATHOLOGY

## 2019-12-28 MED ORDER — QUETIAPINE FUMARATE 25 MG PO TABS
25.0000 mg | ORAL_TABLET | Freq: Every day | ORAL | Status: DC
  Administered 2019-12-28: 25 mg via ORAL
  Filled 2019-12-28: qty 1

## 2019-12-28 MED ORDER — FERROUS SULFATE 325 (65 FE) MG PO TABS
325.0000 mg | ORAL_TABLET | Freq: Three times a day (TID) | ORAL | Status: DC
  Administered 2019-12-28 – 2019-12-29 (×3): 325 mg via ORAL
  Filled 2019-12-28 (×3): qty 1

## 2019-12-28 NOTE — Progress Notes (Signed)
Patient requested pain medication. Upon nurse arrival, patient complained of "seeing spots and feeling dizzy" and stating that "I almost fell when I tried to get up." Patient educated to call nurse if she needs to get up, BP assessed. 131/92. Dr. Jolayne Panther notified

## 2019-12-28 NOTE — Progress Notes (Signed)
Subjective: Postpartum Day 2: Cesarean Delivery Patient reports feeling dizzy and tired. She ambulated and voided. Patient also reports visual and auditory hallucinations which started a few days prior to delivery. She denies suicidal/homicidal ideations    Objective: Vital signs in last 24 hours: Temp:  [98.1 F (36.7 C)-99.1 F (37.3 C)] 98.8 F (37.1 C) (11/02 0804) Pulse Rate:  [79-103] 103 (11/02 0904) Resp:  [17-18] 18 (11/02 0804) BP: (129-148)/(70-92) 131/92 (11/02 0904) SpO2:  [98 %-100 %] 98 % (11/02 0804)  Physical Exam:  General: alert, cooperative and no distress Lochia: appropriate Uterine Fundus: firm Incision: honeycomb dressing, clean dry and intact DVT Evaluation: No evidence of DVT seen on physical exam.  Recent Labs    12/27/19 0428 12/28/19 0936  HGB 10.0* 8.7*  HCT 28.7* 26.2*    Assessment/Plan: Status post Cesarean section. Doing well postoperatively.  Decrease in hemoglobin. Discussed benefits of blood transfusion vs iron transfusion. Patient declined both options and prefers oral iron supplement. Patient understands that her dizziness will not subside immediately Psych consult placed per patient request.  Megan Fuller 12/28/2019, 10:44 AM

## 2019-12-28 NOTE — Consult Note (Signed)
  34 year old female who presented to [facility], with premature rupture of membranes that resulted in cesarean delivery.  Psychiatric consult was placed due to patient reporting visual and auditory hallucinations which started several days prior to delivery.  Upon evaluation patient is eating her lunch (which appears to be brought and from outside), and identifies her sister at her bedside.  She does give verbal consent to speak with her sister in the room.  Patient reports her hallucinations began a few days prior to delivery and describes them as hearing voices and seeing things at night. "  I have told myself that these things are not real.  So I try not to talk to them or listen to them."  Patient sister also adds concerns as to why this took place.  Both deny any family history of mental illness to include bipolar, schizophrenia, delusional disorder, and or ante or post partum psychosis.  Patient does endorse some concerns about initiation of medication, however seems to be open to treatment for complete resolution of hallucinations.  Will start Seroquel 25 mg p.o. nightly for hallucinations and psychosis.  Patient is nave to psychotropic medication will start at a low dose at this time.  Once the evaluation was complete patient requested a Spanish-speaking interpreter, Clinical research associate offered interpreter machine in which she declined and requested a refill person who speaks [place].  Patient and sister acknowledged that both understood what was being said, she declines any suicidal ideations and or homicidal ideations at this time. -We will start Seroquel 25 mg p.o. nightly for hallucinations and psychosis. -We will recommend working closely with social work for referral to integrated behavioral health at [facility] on [address]. -Will attempt to contact interpreter at patient's request, to make sure there are no additional questions or concerns.  We will revisit patient  tomorrow. -Patient does not meet criteria for inpatient admission at this time.

## 2019-12-28 NOTE — Lactation Note (Signed)
This note was copied from a baby's chart. Lactation Consultation Note  Patient Name: Megan Fuller [id number] Date: 12/28/2019 Reason for consult: Follow-up assessment;1st time breastfeeding;Primapara;NICU baby;Preterm <34wks  Mother used electric pump 4 times yesterday. This LC reinforced earlier teaching and encouraged mom to pump for 15 minutes q 3 hours. Mother states that she would like for baby to receive her milk or donor breast milk instead of formula for first feeds. WIC referral faxed 12/28/2019.  Interventions Interventions: Breast feeding basics reviewed;Hand express;DEBP;Expressed milk  Lactation Tools Discussed/Used     Consult Status Consult Status: Follow-up Date: 12/29/19 Follow-up type: In-patient    Elder Negus 12/28/2019, 10:28 AM

## 2019-12-29 MED ORDER — IBUPROFEN 600 MG PO TABS
600.0000 mg | ORAL_TABLET | Freq: Four times a day (QID) | ORAL | 0 refills | Status: DC
Start: 2019-12-29 — End: 2022-05-28

## 2019-12-29 MED ORDER — OXYCODONE-ACETAMINOPHEN 5-325 MG PO TABS
1.0000 | ORAL_TABLET | Freq: Four times a day (QID) | ORAL | 0 refills | Status: DC | PRN
Start: 2019-12-29 — End: 2022-05-28

## 2019-12-29 MED ORDER — FERROUS SULFATE 325 (65 FE) MG PO TABS
325.0000 mg | ORAL_TABLET | Freq: Three times a day (TID) | ORAL | 3 refills | Status: DC
Start: 2019-12-29 — End: 2022-05-28

## 2019-12-29 MED ORDER — QUETIAPINE FUMARATE 25 MG PO TABS
25.0000 mg | ORAL_TABLET | Freq: Every day | ORAL | 1 refills | Status: DC
Start: 2019-12-29 — End: 2022-05-28

## 2019-12-29 NOTE — Op Note (Signed)
Operative Note   SURGERY DATE: 12/26/2019  PRE-OP DIAGNOSIS:  *Pregnancy at 25/1 *Worsening vaginal bleeding *Preterm labor in early labor (2cm) *Chronic idiopathic thrombocytopenia  POST-OP DIAGNOSIS: Same. Delivered   PROCEDURE: Urgent primary low transverse cesarean section via vertical midline skin incision with double layer uterine closure  SURGEON: Surgeon(s) and Role:    [place] Bing, MD - Primary  ASSISTANT: None  ANESTHESIA: general  ESTIMATED BLOOD LOSS:  DRAINS: UOP via indwelling foley  TOTAL IV FLUIDS: crystalloid  VTE PROPHYLAXIS: SCDs to bilateral lower extremities  ANTIBIOTICS: Two grams of Cefazolin were given., within 1 hour of skin incision and azithromycin 500mg  IV x 1 given after  SPECIMENS: placenta and arterial and venous cord gases  COMPLICATIONS: Due to most recent platelets at 59, decision made to do general anesthesia and to do a vertical midline skin incision, to use a more avascular plane  FINDINGS: No intra-abdominal adhesions were noted. Grossly normal uterus, tubes and ovaries. No amniotic fluid, cephalic, female infant, weight , APGARs 4/6/7, intact placenta. Results for JAYLINE, KILBURG (MRN Michelle Piper) as of 12/28/2019 08:50  Ref. Range 12/26/2019 23:40 12/26/2019 23:40  pH cord blood (arterial) Latest Ref Range: 7.210 - 7.380  7.278   pCO2 cord blood (arterial) Latest Ref Range: 42.0 - 56.0 mmHg 48.6   Bicarbonate Latest Ref Range: 13.0 - 22.0 mmol/L 22.0 22.3 (H)  Ph Cord Blood (Venous) Latest Ref Range: 7.240 - 7.380   7.274  pCO2 Cord Blood (Venous) Latest Ref Range: 42.0 - 56.0   49.8    PROCEDURE IN DETAIL: The patient was taken to the operating room where she was then prepped and draped in the normal fashion in the dorsal supine position with a leftward tilt.  After a time out was performed, she underwent general anesthesia.  A vertical midline skin incision was made with the  scalpel and carried through to the underlying layer of fascia. The fascia was then incised at the midline and this incision was extended vertically with the mayo scissors.  The rectus muscles were then separated in the midline and the peritoneum was entered bluntly.  The Alexis retractor was then placed.   A low transverse hysterotomy was made with the scalpel until the endometrial cavity was breached and the amniotic sac entered. This incision was extended bluntly and the infant's head, shoulders and body were delivered atraumatically.The cord was clamped x 2 and cut, and the infant was handed to the awaiting pediatricians, after delayed cord clamping was not done.  The placenta was then gradually expressed from the uterus and then the uterus was exteriorized and cleared of all clots and debris. The hysterotomy was repaired with a running suture of 1-0 monocryl. A second imbricating layer of 1-0 monocryl suture was then placed to achieve excellent hemostasis.   The uterus and adnexa were then returned to the abdomen, and the hysterotomy and all operative sites were reinspected and excellent hemostasis was noted after irrigation and suction of the abdomen with warm saline.  The fascia was then closed with looped 0-PDS from both ends and tied in the middleThe subcutaneous layer was then reapproximated with interrupted sutures of 2-0 plain gut, and the skin was then closed with staples.   The patient  tolerated the procedure well. Sponge, lap, needle, and instrument counts were correct x 2. The patient was transferred to the recovery room awake, alert and breathing independently in stable condition.  12/28/2019 MD Attending Center for [facility]  Healthcare Fish farm manager)

## 2019-12-29 NOTE — Discharge Instructions (Signed)
Parto por cesrea, cuidados posteriores Cesarean Delivery, Care After Esta hoja le brinda informacin sobre cmo cuidarse despus del procedimiento. El mdico tambin podr darle indicaciones ms especficas. Comunquese con el mdico si tiene problemas o preguntas. Qu puedo esperar despus del procedimiento? Despus del procedimiento, es comn tener los siguientes sntomas:  Una pequea cantidad de sangre o de lquido transparente que proviene de la incisin.  Enrojecimiento, hinchazn y dolor en la zona de la incisin.  Dolor y molestia abdominales.  Hemorragia vaginal (loquios). Aunque no haya tenido parto vaginal, tendr sangrado y secrecin vaginal.  Calambres plvicos.  Fatiga. Es posible que sienta dolor, hinchazn y molestias en el tejido que se encuentra entre la vagina y el ano (perineo) en los siguientes casos:  Si la cesrea no fue planificada y le permitieron realizar el trabajo de parto y pujar.  Si le hicieron una incisin en la zona (episiotoma) o el tejido se desgarr durante el intento de parto vaginal. Siga estas indicaciones en su casa: Cuidados de la incisin   Siga las indicaciones del mdico acerca del cuidado de la incisin. Asegrese de hacer lo siguiente: ? Lvese las manos con agua y jabn antes de cambiar las vendas (vendaje). Use desinfectante para manos si no dispone de agua y jabn. ? Si tiene un vendaje, cmbielo o quteselo siguiendo las indicaciones del mdico. ? No retire los puntos (suturas), las grapas cutneas, la goma para cerrar la piel o las tiras adhesivas. Es posible que estos cierres cutneos deban permanecer en la piel durante 2semanas o ms tiempo. Si los bordes de las tiras adhesivas empiezan a despegarse y enroscarse, puede recortar los que estn sueltos. No retire las tiras adhesivas por completo a menos que el mdico se lo indique.  Controle todos los das la zona de la incisin para detectar signos de infeccin. Est atento a los  siguientes signos: ? Aumento del enrojecimiento, la hinchazn o el dolor. ? Ms lquido o sangre. ? Calor. ? Pus o mal olor.  No tome baos de inmersin, no nade ni use un jacuzzi hasta que el mdico la autorice. Pregntele al mdico si puede ducharse.  Cuando tosa o estornude, abrace una almohada. Esto ayuda con el dolor y disminuye la posibilidad de que su incisin se abra (dehiscencia). Haga esto hasta que la incisin cicatrice. Medicamentos  Tome los medicamentos de venta libre y los recetados solamente como se lo haya indicado el mdico.  Si le recetaron un antibitico, tmelo como se lo haya indicado el mdico. No deje de tomar el antibitico aunque comience a sentirse mejor.  No conduzca ni use maquinaria pesada mientras toma analgsicos recetados. Estilo de vida  No beba alcohol. Esto es de suma importancia si est amamantando o toma analgsicos.  No consuma ningn producto que contenga nicotina o tabaco, como cigarrillos, cigarrillos electrnicos y tabaco de mascar. Si necesita ayuda para dejar de fumar, consulte al mdico. Comida y bebida  Beba al menos 8vasos de ochoonzas (240cc) de agua todos los das a menos que el mdico le indique lo contrario. Si amamanta, quiz deba beber an ms cantidad de agua.  Coma alimentos ricos en fibras todos los das. Estos alimentos pueden ayudar a prevenir o aliviar el estreimiento. Los alimentos ricos en fibra incluyen los siguientes: ? Panes y cereales integrales. ? Arroz integral. ? Frijoles. ? Frutas y verduras frescas. Actividad   Si es posible, pdale a alguien que la ayude a cuidar del beb y con las tareas del hogar durante al   menos algunos das despus de que le den el alta del hospital.  Retome sus actividades normales como se lo haya indicado el mdico. Pregntele al mdico qu actividades son seguras para usted.  Descanse todo lo que pueda. Trate de descansar o tomar una siesta mientras el beb duerme.  No levante  ningn objeto que pese ms de 10libras (4,5kg) o el lmite de peso que le hayan indicado, hasta que el mdico le diga que puede hacerlo.  Hable con el mdico sobre cundo puede retomar la actividad sexual. Esto puede depender de lo siguiente: ? Riesgo de sufrir una infeccin. ? La rapidez con que cicatrice. ? Comodidad y deseo de retomar la actividad sexual. Indicaciones generales  No use tampones ni se haga duchas vaginales hasta que el mdico la autorice.  Use ropa floja y cmoda y un sostn firme y que le calce bien.  Mantenga el perineo limpio y seco. Cuando vaya al bao, siempre higiencese de adelante hacia atrs.  Si expulsa un cogulo de sangre, gurdelo y llame al mdico para informrselo. No deseche los cogulos de sangre por el inodoro antes de recibir indicaciones del mdico.  Concurra a todas las visitas de seguimiento para usted y el beb, como se lo haya indicado el mdico. Esto es importante. Comunquese con un mdico si:  Tiene los siguientes sntomas: ? Fiebre. ? Secrecin vaginal con mal olor. ? Pus o mal olor en el lugar de la incisin. ? Dificultad o dolor al orinar. ? Aumento o disminucin repentinos de la frecuencia de las deposiciones. ? Aumento del enrojecimiento, la hinchazn o el dolor alrededor de la incisin. ? Aumento del lquido o sangre proveniente de la incisin. ? Erupcin cutnea. ? Nuseas. ? Poco inters o falta de inters en actividades que solan gustarle. ? Dudas sobre su cuidado y el del beb.  Su incisin se siente caliente al tacto.  Siente dolor en las mamas y se ponen rojas o duras.  Siente tristeza o preocupacin de forma inusual.  Vomita.  Elimina un cogulo de sangre grande por la vagina.  Orina ms de lo habitual.  Se siente mareado o aturdido. Solicite ayuda inmediatamente si:  Tiene los siguientes sntomas: ? Dolor que no desaparece o no mejora con medicamentos. ? Dolor en el pecho. ? Dificultad para  respirar. ? Visin borrosa o manchas en la visin. ? Pensamientos de autolesionarse o lesionar al beb. ? Nuevo dolor en el abdomen o en una de las piernas. ? Dolor de cabeza intenso.  Se desmaya.  Tiene una hemorragia tan intensa en la vagina que empapa ms de un apsito en una hora. El sangrado no debe ser ms abundante que el perodo ms intenso que haya tenido. Resumen  Despus del procedimiento, es comn tener dolor en el lugar de la incisin, clicos abdominales, y sangrado vaginal leve.  Controle todos los das la zona de la incisin para detectar signos de infeccin.  Informe al mdico sobre cualquier sntoma inusual.  Concurra a todas las visitas de seguimiento para usted y el beb, como se lo haya indicado el mdico. Esta informacin no tiene como fin reemplazar el consejo del mdico. Asegrese de hacerle al mdico cualquier pregunta que tenga. Document Revised: 09/24/2017 Document Reviewed: 09/24/2017 Elsevier Patient Education  2020 [employer]  

## 2019-12-29 NOTE — Discharge Summary (Signed)
Postpartum Discharge Summary  Date of Service updated 12/29/2019     Patient Name: Megan Fuller DOB: [birth year] MRN: [id number]  Date of admission: 12/14/2019 Delivery date:12/26/2019  Delivering provider: Aletha Halim  Date of discharge: 12/29/2019  Admitting diagnosis: Supervision of high-risk pregnancy [O09.90]  Patient Active Problem List   Diagnosis Date Noted  . [redacted] weeks gestation of pregnancy 12/26/2019  . Preterm premature rupture of membranes (PPROM) in second trimester, antepartum 12/16/2019  . Preterm labor in second trimester 12/15/2019  . Supervision of high-risk pregnancy 11/25/2019  . UTI in pregnancy, antepartum, second trimester 10/27/2019  . Chronic ITP (idiopathic thrombocytopenia) (HCC) 10/27/2019  . 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 PCOS (polycystic ovarian syndrome) 02/10/2014  Patient was admitted with [facility] and was given a full 7 day course of antibiotics. She remained stable until she began bleeding on 10/31 and she was brought for urgent cesarean section, done by Dr. Ilda Basset. Staples were used for skin closure, vertical incison She was seen by psychiatry due to depression and hallucinations and Seroquel was ordered Intrauterine pregnancy: [redacted]w[redacted]d    Secondary diagnosis:  Principal Problem:   Preterm premature rupture of membranes (PPROM) in second trimester, antepartum Active Problems:   PCOS (polycystic ovarian syndrome)   UTI in pregnancy, antepartum, second trimester   Chronic ITP (idiopathic thrombocytopenia) (HCC)   Depression affecting pregnancy in second trimester, antepartum   Supervision of high-risk pregnancy   Preterm labor in second trimester   [redacted] weeks gestation of pregnancy  Additional problems: visual hallucination seen by psychiatry    Discharge diagnosis: Preterm Pregnancy Delivered                                              Post partum procedures:no Augmentation:  N/A Complications: None  Hospital course: Onset of Labor With Unplanned C/S   34y.o. yo G1P0101 at 223w1das [facility] Patient had a labor course significant for onset of vaginal bleeding after hospitalization, BMZ, antibiotic for 7 days and magnesium . The patient went for cesarean section due to onset of vaginal bleeding. Delivery details as follows: Membrane Rupture Time/Date:  ,   Delivery Method:C-Section, Low Vertical  Details of operation can be found in separate operative note. Patient had an uncomplicated postpartum course.  She is ambulating,tolerating a regular diet, passing flatus, and urinating well.  Patient is discharged home in stable condition 12/29/19.  Newborn Data: Birth date:12/26/2019  Birth time:10:55 PM  Gender:Female  Living status:Living  Apgars:4 ,6  Weight:660 g   Magnesium Sulfate received: Yes: Neuroprotection BMZ received: Yes Rhophylac:No MMR:No T-DaP:Given prenatally Flu: No Transfusion:No  Physical exam  Vitals:   12/28/19 1645 12/28/19 1942 12/28/19 2334 12/29/19 0805  BP: 136/80 133/84 136/83 (!) 135/92  Pulse: 74 72 77 84  Resp: '18 18 18 18  ' Temp: 98.1 F (36.7 C) 98.1 F (36.7 C) 98.2 F (36.8 C) 98 F (36.7 C)  TempSrc: Oral Oral Oral Oral  SpO2: 100% 100% 100% 100%  Weight:      Height:       General: alert, cooperative and no distress Lochia: appropriate Uterine Fundus: firm Incision: Healing well with no significant drainage DVT Evaluation: No evidence of DVT seen on physical exam. Labs: Lab Results  Component Value Date   WBC 7.5 12/28/2019  HGB 8.7 (L) 12/28/2019   HCT 26.2 (L) 12/28/2019   MCV 102.3 (H) 12/28/2019   PLT 60 (L) 12/28/2019   CMP Latest Ref Rng & Units 12/28/2019  Glucose 70 - 99 mg/dL 66(L)  BUN 6 - 20 mg/dL 6  Creatinine 0.44 - 1.00 mg/dL 0.66  Sodium 135 - 145 mmol/L 139  Potassium 3.5 - 5.1 mmol/L 3.8  Chloride 98 - 111 mmol/L 108  CO2 22 - 32 mmol/L 23  Calcium 8.9 -  10.3 mg/dL 8.6(L)  Total Protein 6.5 - 8.1 g/dL 5.1(L)  Total Bilirubin 0.3 - 1.2 mg/dL 1.4(H)  Alkaline Phos 38 - 126 U/L 94  AST 15 - 41 U/L 55(H)  ALT 0 - 44 U/L 24   Edinburgh Score: No flowsheet data found.   After visit meds:  Allergies as of 12/29/2019   No Known Allergies     Medication List    TAKE these medications   ferrous sulfate 325 (65 FE) MG tablet Take 1 tablet (325 mg total) by mouth 3 (three) times daily with meals.   ibuprofen 600 MG tablet Commonly known as: ADVIL Take 1 tablet (600 mg total) by mouth every 6 (six) hours.   oxyCODONE-acetaminophen 5-325 MG tablet Commonly known as: PERCOCET/ROXICET Take 1-2 tablets by mouth every 6 (six) hours as needed for moderate pain or severe pain.   PRENATE PO Take by mouth.   QUEtiapine 25 MG tablet Commonly known as: SEROQUEL Take 1 tablet (25 mg total) by mouth at bedtime.        Discharge home in stable condition Infant Feeding: Breast Infant Disposition:NICU Discharge instruction: per After Visit Summary and Postpartum booklet. Activity: Advance as tolerated. Pelvic rest for 6 weeks.  Diet: routine diet Future Appointments: Future Appointments  Date Time Provider [place]  01/05/2020 11:00 AM Brunetta Genera, MD [facility] None  01/12/2020  8:35 AM Woodroe Mode, MD [facility]  01/12/2020  9:30 AM [facility] [place]   Follow up Visit:  [facility] Follow up in 1 week(s).   Specialty: Obstetrics and Gynecology Why: incision check, remove staples Contact information: [place] [phone] [phone]               Please schedule this patient for a In person postpartum visit in 1 week with the following provider: RN and incision check. Additional Postpartum F/U:Postpartum Depression checkup and Incision check 1 week  High risk pregnancy complicated  by: PSROM and preterm delivery Delivery mode:  C-Section, Low Vertical  Anticipated Birth Control:  Unsure   12/29/2019 Emeterio Reeve, MD

## 2019-12-30 ENCOUNTER — Ambulatory Visit: Payer: Self-pay

## 2019-12-30 NOTE — Lactation Note (Signed)
This note was copied from a baby's chart. Lactation Consultation Note  Patient Name: Boy Johnica Armwood [place]'P Date: 12/30/2019 Reason for consult: NICU baby  This LC has made multiple attempts over the past two days to consult with mother. RN to notify LC if mom returns today. Will plan f/u care.   Consult Status Consult Status: Follow-up Date: 12/31/19 Follow-up type: In-patient    Elder Negus 12/30/2019, 6:07 PM

## 2020-01-01 ENCOUNTER — Other Ambulatory Visit: Payer: Self-pay

## 2020-01-01 ENCOUNTER — Inpatient Hospital Stay (HOSPITAL_COMMUNITY)
Admission: AD | Admit: 2020-01-01 | Discharge: 2020-01-01 | Disposition: A | Discharge status: Home / Self Care | Payer: Self-pay | Attending: Obstetrics & Gynecology | Admitting: Obstetrics & Gynecology

## 2020-01-01 DIAGNOSIS — O09899 Supervision of other high risk pregnancies, unspecified trimester: Secondary | ICD-10-CM

## 2020-01-01 DIAGNOSIS — D693 Immune thrombocytopenic purpura: Secondary | ICD-10-CM | POA: Insufficient documentation

## 2020-01-01 DIAGNOSIS — O9913 Other diseases of the blood and blood-forming organs and certain disorders involving the immune mechanism complicating the puerperium: Secondary | ICD-10-CM

## 2020-01-01 DIAGNOSIS — O165 Unspecified maternal hypertension, complicating the puerperium: Secondary | ICD-10-CM | POA: Insufficient documentation

## 2020-01-01 DIAGNOSIS — G8918 Other acute postprocedural pain: Secondary | ICD-10-CM | POA: Insufficient documentation

## 2020-01-01 DIAGNOSIS — O99893 Other specified diseases and conditions complicating puerperium: Secondary | ICD-10-CM

## 2020-01-01 DIAGNOSIS — Z98891 History of uterine scar from previous surgery: Secondary | ICD-10-CM

## 2020-01-01 LAB — COMPREHENSIVE METABOLIC PANEL
ALT: 30 U/L (ref 0–44)
AST: 47 U/L — ABNORMAL HIGH (ref 15–41)
Albumin: 2.3 g/dL — ABNORMAL LOW (ref 3.5–5.0)
Alkaline Phosphatase: 114 U/L (ref 38–126)
Anion gap: 7 (ref 5–15)
BUN: 10 mg/dL (ref 6–20)
CO2: 25 mmol/L (ref 22–32)
Calcium: 8.6 mg/dL — ABNORMAL LOW (ref 8.9–10.3)
Chloride: 107 mmol/L (ref 98–111)
Creatinine, Ser: 0.73 mg/dL (ref 0.44–1.00)
GFR, Estimated: >60 mL/min (ref >60)
Glucose, Bld: 94 mg/dL (ref 70–99)
Potassium: 3.7 mmol/L (ref 3.5–5.1)
Sodium: 139 mmol/L (ref 135–145)
Total Bilirubin: 1 mg/dL (ref 0.3–1.2)
Total Protein: 5.3 g/dL — ABNORMAL LOW (ref 6.5–8.1)

## 2020-01-01 LAB — CBC
HCT: 28.3 % — ABNORMAL LOW (ref 36.0–46.0)
Hemoglobin: 9.5 g/dL — ABNORMAL LOW (ref 12.0–15.0)
MCH: 35.3 pg — ABNORMAL HIGH (ref 26.0–34.0)
MCHC: 33.6 g/dL (ref 30.0–36.0)
MCV: 105.2 fL — ABNORMAL HIGH (ref 80.0–100.0)
Platelets: 77 10*3/uL — ABNORMAL LOW (ref 150–400)
RBC: 2.69 MIL/uL — ABNORMAL LOW (ref 3.87–5.11)
RDW: 14 % (ref 11.5–15.5)
WBC: 4.5 10*3/uL (ref 4.0–10.5)
nRBC: 0 % (ref 0.0–0.2)

## 2020-01-01 LAB — URINALYSIS, ROUTINE W REFLEX MICROSCOPIC
Bacteria, UA: NONE SEEN
Bilirubin Urine: NEGATIVE
Glucose, UA: NEGATIVE mg/dL
Ketones, ur: NEGATIVE mg/dL
Leukocytes,Ua: NEGATIVE
Nitrite: NEGATIVE
Protein, ur: NEGATIVE mg/dL
Specific Gravity, Urine: 1.015 (ref 1.005–1.030)
pH: 6 (ref 5.0–8.0)

## 2020-01-01 LAB — PROTEIN / CREATININE RATIO, URINE
Creatinine, Urine: 97.68 mg/dL
Total Protein, Urine: <6 mg/dL

## 2020-01-01 MED ORDER — TRAMADOL HCL 50 MG PO TABS
50.0000 mg | ORAL_TABLET | Freq: Once | ORAL | Status: AC
  Administered 2020-01-01: 50 mg via ORAL
  Filled 2020-01-01: qty 1

## 2020-01-01 MED ORDER — AMLODIPINE BESYLATE 5 MG PO TABS
5.0000 mg | ORAL_TABLET | Freq: Every day | ORAL | Status: DC
  Administered 2020-01-01: 5 mg via ORAL
  Filled 2020-01-01: qty 1

## 2020-01-01 MED ORDER — AMLODIPINE BESYLATE 5 MG PO TABS: 5.0000 mg | ORAL_TABLET | Freq: Every day | ORAL | 2 refills | Status: DC

## 2020-01-01 NOTE — MAU Note (Signed)
Staples removed by Martyn Malay RN, steri strips applied

## 2020-01-01 NOTE — MAU Provider Note (Signed)
Chief Complaint: Remove staples   None     SUBJECTIVE HPI: Megan Fuller is a 34 y.o. G1P0101 who is POD# 7 following primary classical C/S for PPROM and vaginal bleeding who presents to maternity admissions reporting she missed her appointment to have staples removed yesterday and has pain where the staples are and wants them removed.  She denies any headache, epigastric pain, or visual disturbances. She reports she is taking ibuprofen but can't take the Percocet because it makes her dizzy and have hallucinations. She is having light lochia only.     Location: abdomen/incision Quality: sharp, burning Severity: 8/10 on pain scale Duration: 2-3 days Timing: constant Modifying factors: moving, walking Associated signs and symptoms: none  HPI  Past Medical History:  Diagnosis Date   PCOS (polycystic ovarian syndrome)    Past Surgical History:  Procedure Laterality Date   CESAREAN SECTION N/A 12/26/2019   Procedure: CESAREAN SECTION;  Surgeon: Remsen Bing, MD;  Location: [facility];  Service: Obstetrics;  Laterality: N/A;   Social History   Socioeconomic History   Marital status: Single    Spouse name: Not on file   Number of children: Not on file   Years of education: Not on file   Highest education level: Not on file  Occupational History   Not on file  Tobacco Use   Smoking status: Never Smoker   Smokeless tobacco: Never Used  Substance and Sexual Activity   Alcohol use: No    Alcohol/week: 0.0 standard drinks   Drug use: No   Sexual activity: Yes  Other Topics Concern   Not on file  Social History Narrative   Not on file   Social Determinants of Health   Financial Resource Strain:    Difficulty of Paying Living Expenses: Not on file  Food Insecurity:    Worried About Running Out of Food in the Last Year: Not on file   Ran Out of Food in the Last Year: Not on file  Transportation Needs:    Lack of Transportation (Medical): Not on  file   Lack of Transportation (Non-Medical): Not on file  Physical Activity:    Days of Exercise per Week: Not on file   Minutes of Exercise per Session: Not on file  Stress:    Feeling of Stress : Not on file  Social Connections:    Frequency of Communication with Friends and Family: Not on file   Frequency of Social Gatherings with Friends and Family: Not on file   Attends Religious Services: Not on file   Active Member of Clubs or Organizations: Not on file   Attends Banker Meetings: Not on file   Marital Status: Not on file  Intimate Partner Violence:    Fear of Current or Ex-Partner: Not on file   Emotionally Abused: Not on file   Physically Abused: Not on file   Sexually Abused: Not on file   No current facility-administered medications on file prior to encounter.   Current Outpatient Medications on File Prior to Encounter  Medication Sig Dispense Refill   ferrous sulfate 325 (65 FE) MG tablet Take 1 tablet (325 mg total) by mouth 3 (three) times daily with meals. 30 tablet 3   ibuprofen (ADVIL) 600 MG tablet Take 1 tablet (600 mg total) by mouth every 6 (six) hours. 30 tablet 0   oxyCODONE-acetaminophen (PERCOCET/ROXICET) 5-325 MG tablet Take 1-2 tablets by mouth every 6 (six) hours as needed for moderate pain or severe pain.  30 tablet 0   Prenat MV-Min-Methylfolate-FA (PRENATE PO) Take by mouth.     QUEtiapine (SEROQUEL) 25 MG tablet Take 1 tablet (25 mg total) by mouth at bedtime. 30 tablet 1   No Known Allergies  ROS:  Review of Systems  Constitutional: Negative for chills, fatigue and fever.  Eyes: Negative for visual disturbance.  Respiratory: Negative for shortness of breath.   Cardiovascular: Negative for chest pain.  Gastrointestinal: Positive for abdominal pain. Negative for nausea and vomiting.  Genitourinary: Negative for difficulty urinating, dysuria, flank pain, pelvic pain, vaginal bleeding, vaginal discharge and vaginal  pain.  Skin: Positive for wound.       Vertical incision on lower abdomen with ~ 20 staples in place  Neurological: Negative for dizziness and headaches.  Psychiatric/Behavioral: Negative.      I have reviewed patient's Past Medical Hx, Surgical Hx, Family Hx, Social Hx, medications and allergies.   Physical Exam   Patient Vitals for the past 24 hrs:  BP Temp Temp src Pulse Resp SpO2  01/01/20 1744 (!) 149/93 -- -- 93 -- --  01/01/20 1659 (!) 140/94 98.4 F (36.9 C) Oral (!) 101 18 98 %   Constitutional: Well-developed, well-nourished female in no acute distress.  Cardiovascular: normal rate Respiratory: normal effort GI: Abd soft, non-tender. Pos BS x 4 Integumentary:  Incision well approximated without edema, erythema, or exudate, 20 staples are present on vertical incision MS: Extremities nontender, no edema, normal ROM Neurologic: Alert and oriented x 4.  GU: Neg CVAT.   LAB RESULTS Results for orders placed or performed during the hospital encounter of 01/01/20 (from the past 24 hour(s))  Protein / creatinine ratio, urine     Status: None   Collection Time: 01/01/20  5:32 PM  Result Value Ref Range   Creatinine, Urine 97.68 mg/dL   Total Protein, Urine <6 mg/dL   Protein Creatinine Ratio        0.00 - 0.15 mg/mg[Cre]  Urinalysis, Routine w reflex microscopic Urine, Clean Catch     Status: Abnormal   Collection Time: 01/01/20  5:32 PM  Result Value Ref Range   Color, Urine YELLOW YELLOW   APPearance CLEAR CLEAR   Specific Gravity, Urine 1.015 1.005 - 1.030   pH 6.0 5.0 - 8.0   Glucose, UA NEGATIVE NEGATIVE mg/dL   Hgb urine dipstick MODERATE (A) NEGATIVE   Bilirubin Urine NEGATIVE NEGATIVE   Ketones, ur NEGATIVE NEGATIVE mg/dL   Protein, ur NEGATIVE NEGATIVE mg/dL   Nitrite NEGATIVE NEGATIVE   Leukocytes,Ua NEGATIVE NEGATIVE   RBC / HPF 0-5 0 - 5 RBC/hpf   WBC, UA 0-5 0 - 5 WBC/hpf   Bacteria, UA NONE SEEN NONE SEEN   Squamous Epithelial / LPF 0-5 0 - 5    Mucus PRESENT   CBC     Status: Abnormal   Collection Time: 01/01/20  5:42 PM  Result Value Ref Range   WBC 4.5 4.0 - 10.5 K/uL   RBC 2.69 (L) 3.87 - 5.11 MIL/uL   Hemoglobin 9.5 (L) 12.0 - 15.0 g/dL   HCT 16.1 (L) 36 - 46 %   MCV 105.2 (H) 80.0 - 100.0 fL   MCH 35.3 (H) 26.0 - 34.0 pg   MCHC 33.6 30.0 - 36.0 g/dL   RDW 09.6 04.5 - 40.9 %   Platelets 77 (L) 150 - 400 K/uL   nRBC 0.0 0.0 - 0.2 %  Comprehensive metabolic panel     Status: Abnormal   Collection Time:  01/01/20  5:42 PM  Result Value Ref Range   Sodium 139 135 - 145 mmol/L   Potassium 3.7 3.5 - 5.1 mmol/L   Chloride 107 98 - 111 mmol/L   CO2 25 22 - 32 mmol/L   Glucose, Bld 94 70 - 99 mg/dL   BUN 10 6 - 20 mg/dL   Creatinine, Ser 5.63 0.44 - 1.00 mg/dL   Calcium 8.6 (L) 8.9 - 10.3 mg/dL   Total Protein 5.3 (L) 6.5 - 8.1 g/dL   Albumin 2.3 (L) 3.5 - 5.0 g/dL   AST 47 (H) 15 - 41 U/L   ALT 30 0 - 44 U/L   Alkaline Phosphatase 114 38 - 126 U/L   Total Bilirubin 1.0 0.3 - 1.2 mg/dL   GFR, Estimated >14 >97 mL/min   Anion gap 7 5 - 15    --/--/O POS (10/29 0656)  IMAGING [place] MFM OB DETAIL +14 WK  Result Date: 12/16/2019 ----------------------------------------------------------------------  OBSTETRICS REPORT                       (Signed Final 12/16/2019 04:31 pm) ---------------------------------------------------------------------- Patient Info  ID #:       [id number]                          D.O.B.:  1985-03-22 (34 yrs)  Name:       [name]              Visit Date: 12/16/2019 09:37 am ---------------------------------------------------------------------- Performed By  Attending:        Lin Landsman      Referred By:      [facility]                    MD                                       Care  Performed By:     Lenise Arena        [facility]  ---------------------------------------------------------------------- Orders  #  Description                           Code        Ordered By  1  [place] MFM OB DETAIL +14 WK               50277.41    Scheryl Darter ----------------------------------------------------------------------  #  Order #                     Accession #                Episode #  1  [id number]                   [id number]                 [id number] ---------------------------------------------------------------------- Indications  Premature rupture of membranes - leaking       O42.90  fluid  Encounter for uncertain dates                  Z36.87  Medical complication of pregnancy (Adult       O26.90  chronic immune thrombocytopenic purpura)  Obesity complicating pregnancy, second         O99.212  trimester (BMI 31)  [redacted] weeks gestation of pregnancy                Z3A.23 ---------------------------------------------------------------------- Vital Signs  Weight (lb): 181                               Height:        5'4"  BMI:         31.07 ---------------------------------------------------------------------- Fetal Evaluation  Num Of Fetuses:         1  Fetal Heart Rate(bpm):  150  Cardiac Activity:       Observed  Presentation:           Cephalic  Placenta:               Anterior  P. Cord Insertion:      Visualized, central  Amniotic Fluid  AFI FV:      Oligohydramnios                              Largest Pocket(cm)                              3.3 ---------------------------------------------------------------------- Biometry  BPD:      55.7  mm     G. Age:  23w 0d         19  %    CI:        70.84   %    70 - 86                                                          FL/HC:      18.8   %    18.7 - 20.9  HC:      210.9  mm     G. Age:  23w 1d         15  %    HC/AC:      1.07        1.05 - 1.21  AC:      197.7  mm     G. Age:  24w 3d         65  %    FL/BPD:     71.1   %    71 - 87  FL:       39.6  mm     G. Age:  22w 5d         13  %    FL/AC:       20.0   %    20 - 24  CER:      25.3  mm     G. Age:  23w 32d  48  %  CM:        5.9  mm  Est. FW:     612  gm      1 lb 6 oz     37  % ---------------------------------------------------------------------- OB History  Gravidity:    1 ---------------------------------------------------------------------- Gestational Age  U/S Today:     23w 2d                                        EDD:   04/11/20  Best:          23w 5d     Det. ByMarcella Dubs         EDD:   04/08/20                                      (08/10/19) ---------------------------------------------------------------------- Anatomy  Cranium:               Not well visualized    Aortic Arch:            Not well visualized  Cavum:                 Not well visualized    Ductal Arch:            Not well visualized  Ventricles:            Not well visualized    Diaphragm:              Not well visualized  Choroid Plexus:        Not well visualized    Stomach:                Absence of fluid                                                                        filled stomach  Cerebellum:            Appears normal         Abdomen:                Appears normal  Posterior Fossa:       Appears normal         Abdominal Wall:         Appears nml (cord                                                                        insert, abd wall)  Nuchal Fold:           Not applicable (>20    Cord Vessels:           Appears normal ([redacted]  wks GA)                                        vessel cord)  Face:                  Not well visualized    Kidneys:                Appear normal  Lips:                  Not well visualized    Bladder:                Appears normal  Thoracic:              Appears normal         Spine:                  Ltd views no                                                                        intracranial signs of                                                                        NTD  Heart:                 Not well  visualized    Upper Extremities:      Appears normal  RVOT:                  Appears normal         Lower Extremities:      Appears normal  LVOT:                  Not well visualized  Other:  Technically difficult due to low amniotic fluid and fetal position. ---------------------------------------------------------------------- Cervix Uterus Adnexa  Cervix  Not visualized (advanced GA >24wks) ---------------------------------------------------------------------- Impression  Single intrauterine pregnancy here for a detailed anatomy  with an inpatient admission due to premature rupture of  membranes.  Normal anatomy with measurements consistent with dates  There is good fetal movement and with oligohydramnios.  Suboptimal views of the fetal anatomy were obtained  secondary to fetal position and decreased amniotic fluid  volume. ---------------------------------------------------------------------- Recommendations  Follow up growth in 4 week  Management and testing per in patient provider. ----------------------------------------------------------------------               Lin Landsman, MD Electronically Signed Final Report   12/16/2019 04:31 pm ----------------------------------------------------------------------  [place] MFM OB LIMITED  Result Date: 12/23/2019 ----------------------------------------------------------------------  OBSTETRICS REPORT                       (Signed Final 12/23/2019 02:15 pm) ---------------------------------------------------------------------- Patient Info  ID #:       [id number]  D.O.B.:  [birth year] (34 yrs)  Name:       [name]              Visit Date: 12/23/2019 01:57 pm ---------------------------------------------------------------------- Performed By  Attending:        Noralee Space MD        Referred By:      [facility]                                                             Care  Performed By:     Sandi Mealy        [facility] ---------------------------------------------------------------------- Orders  #  Description                           Code        Ordered By  1  [place] MFM OB LIMITED                     76815.01    JAMES ARNOLD ----------------------------------------------------------------------  #  Order #                     Accession #                Episode #  1  [id number]                   [id number]                 [id number] ---------------------------------------------------------------------- Indications  Premature rupture of membranes - leaking       O42.90  fluid  Medical complication of pregnancy (Adult       O26.90  chronic immune thrombocytopenic purpura)  Obesity complicating pregnancy, second         O99.212  trimester (BMI 31)  [redacted] weeks gestation of pregnancy                Z3A.24 ---------------------------------------------------------------------- Vital Signs                                                 Height:        5'4" ---------------------------------------------------------------------- Fetal Evaluation  Num Of Fetuses:         1  Fetal Heart Rate(bpm):  146  Cardiac Activity:       Observed  Presentation:           Cephalic  Placenta:               Anterior  P. Cord Insertion:      Visualized  Amniotic Fluid  AFI FV:      Anhydramnios  Largest Pocket(cm)                              0 ---------------------------------------------------------------------- OB History  Gravidity:    1 ---------------------------------------------------------------------- Gestational Age  Best:          24w 5d     Det. ByMarcella Dubs         EDD:   04/08/20                                      (08/10/19) ---------------------------------------------------------------------- Impression  Patient was admitted with a diagnosis of PPROM.  She complains of decreased fetal movements.  A limited  ultrasound study was performed .  No measurable  pocket of amniotic fluid was seen (anhydramnios).  Cephalic  presentation.  Good fetal activity was observed on ultrasound.  I reviewed the NST which is reassuring for this gestational  age. ---------------------------------------------------------------------- Recommendations  -Weekly limited ultrasound evaluation.  -BPP to begin at 28 to [redacted] weeks gestation. ----------------------------------------------------------------------                  Noralee Space, MD Electronically Signed Final Report   12/23/2019 02:15 pm ----------------------------------------------------------------------   MAU Management/MDM: Orders Placed This Encounter  Procedures   CBC   Comprehensive metabolic panel   Protein / creatinine ratio, urine   Urinalysis, Routine w reflex microscopic   Abdominal Binder   Discharge patient    Meds ordered this encounter  Medications   traMADol (ULTRAM) tablet 50 mg   amLODipine (NORVASC) tablet 5 mg   amLODipine (NORVASC) 5 MG tablet    Sig: Take 1 tablet (5 mg total) by mouth daily.    Dispense:  30 tablet    Refill:  2    Order Specific Question:   Supervising Provider    Answer:   Conan Bowens [id number]    Pt with abdominal pain, only at site of incision, and pt reports she is unable t to take her pain medication due to side effects.  CNM attempted to remove staples but pt with significant pain so discussed options and pt agreed to take lower dose of pain medication and RN from OR to come and remove staples. Staples removed without difficulty. BP elevated so PEC labs ordered.  AST slightly elevated at 47 and Plts remain low but are stable at 77.  Consult Dr Debroah Loop with assessment and findings.  Start pt on Norvasc 5 mg daily and have close f/u outpatient in office this week.  First dose given in MAU.  Message sent to [facility] for appt with provider this week.  Pt to cut Percocet in half at home and take ibuprofen as  directed.  Abdominal binder ordered per pt request.   Pt discharged with strict PEC precautions.  ASSESSMENT 1. Postpartum hypertension   2. Postoperative pain   3. Chronic ITP (idiopathic thrombocytopenia) (HCC)     PLAN Discharge home with PEC precautions  Allergies as of 01/01/2020   No Known Allergies     Medication List    TAKE these medications   amLODipine 5 MG tablet Commonly known as: NORVASC Take 1 tablet (5 mg total) by mouth daily. Start taking on: January 02, 2020   ferrous sulfate 325 (65 FE) MG tablet Take 1 tablet (325 mg total) by mouth 3 (three) times daily with meals.  ibuprofen 600 MG tablet Commonly known as: ADVIL Take 1 tablet (600 mg total) by mouth every 6 (six) hours.   oxyCODONE-acetaminophen 5-325 MG tablet Commonly known as: PERCOCET/ROXICET Take 1-2 tablets by mouth every 6 (six) hours as needed for moderate pain or severe pain.   PRENATE PO Take by mouth.   QUEtiapine 25 MG tablet Commonly known as: SEROQUEL Take 1 tablet (25 mg total) by mouth at bedtime.        Sharen CounterLisa Leftwich-Kirby Certified Nurse-Midwife 01/01/2020  7:31 PM

## 2020-01-01 NOTE — MAU Note (Signed)
Megan Fuller is a 34 y.o. here in MAU reporting: was supposed to be seen yesterday in the office but was unable to go. States she is here to have staples removed, the staples are causing her pain.   Pain score: 9/10  Vitals:   01/01/20 1659  BP: (!) 140/94  Pulse: (!) 101  Resp: 18  Temp: 98.4 F (36.9 C)  SpO2: 98%     Lab orders placed from triage: none

## 2020-01-03 ENCOUNTER — Ambulatory Visit: Payer: Self-pay

## 2020-01-03 NOTE — Lactation Note (Signed)
This note was copied from a baby's chart. Lactation Consultation Note  Patient Name: Megan Fuller [id number]'O Date: 01/03/2020 Reason for consult: Follow-up assessment;Primapara;1st time breastfeeding;NICU baby;Infant < 6lbs;Preterm <34wks  This LC helped [facility] with this Spanish Speaking patient. Provided education and assistance while in [facility] consultation, mom willing to start pumping while in the NICU in addition of doing it at home.    Maternal Data    Feeding Feeding Type: Donor Breast Milk  LATCH Score                   Interventions Interventions: Breast feeding basics reviewed;Skin to skin;Breast massage;Hand express;DEBP  Lactation Tools Discussed/Used Tools: Pump;Flanges Flange Size: 24 Breast pump type: Double-Electric Breast Pump   Consult Status Consult Status: Follow-up Date: 01/06/20 Follow-up type: In-patient    Serafin Decatur Venetia Constable 01/03/2020, 3:29 PM

## 2020-01-03 NOTE — Lactation Note (Signed)
This note was copied from a baby's chart. Lactation Consultation Note  Patient Name: Megan Fuller ZOXWR'[id number] Date: 01/03/2020 Reason for consult: Follow-up assessment;Primapara;1st time breastfeeding;NICU baby;Infant < 6lbs;Preterm <34wks PCOS  LC in with spanish speaking LC to consult with Mom.   Baby is 62 days old and AGA [redacted]w[redacted]d and remains on ventilator at 40%.    Asked Mom about her pumping frequency.  Mom states she is pumping 3 times a day, expressing about 15 ml.  Mom states she has a [employer] DEBP at home and is often using the hand pump.    Asked Mom if she still wanted to increase her milk supply and she said yes she does.  Hand's free pumping bra created for Mom and explained to her the benefits of hand's on pumping to increase milk supply.    Encouraged Mom to increase frequency of pumping and explained importance of Mom's breast milk as baby is very preterm.    Talked to Mom about her feelings as she was looking forlorn.  Mom desires to speak with SW.  Noted some bruising on her arms.  Set up DEBP in room and assisted Mom to pump on maintenance setting using the "belly band" as a hand's free and demonstrated breast compression during pumping to stimulate her milk supply.  Interventions Interventions: Breast feeding basics reviewed;Skin to skin;Breast massage;Hand express;DEBP  Lactation Tools Discussed/Used Tools: Pump;Flanges Flange Size: 24 Breast pump type: Double-Electric Breast Pump   Consult Status Consult Status: Follow-up Date: 01/06/20 Follow-up type: In-patient    [name] 01/03/2020, 2:50 PM

## 2020-01-05 ENCOUNTER — Inpatient Hospital Stay: Payer: No Typology Code available for payment source | Attending: Hematology | Admitting: Hematology

## 2020-01-06 ENCOUNTER — Institutional Professional Consult (permissible substitution): Payer: Self-pay | Admitting: Licensed Clinical Social Worker

## 2020-01-07 ENCOUNTER — Ambulatory Visit: Payer: Self-pay

## 2020-01-09 ENCOUNTER — Ambulatory Visit: Payer: Self-pay

## 2020-01-09 NOTE — Lactation Note (Signed)
This note was copied from a baby's chart. Lactation Consultation Note  Patient Name: Boy Lucielle Vokes TAVWP'V Date: 01/09/2020   NICU RN called for [facility] assistance due to pump not running. When LC arrived, NICU RNs were already assisting mom with pumping, apparently the white membrane was left behind and caused the pump not to have any suction.   Mom told LC that she's getting more "milk" here at the hospital than at home. Encouraged mom to pump every 3 hours, at least 8 times/24 hours. Breastmilk storage containers filling in during [facility] consultation, mom was getting at least 10-15 ml on each side just in the beginning of pumping session, praised for her efforts.  Offered mom assistance with the set up of the pump parts, unsure how often she's putting the parts together and taking them apart, but she said she was OK for now. Asked mom to call for assistance when needed and encouraged to pump every time she's in the NICU. She reported all questions and concerns were answered, she's aware of LC OP services and will call PRN.   Maternal Data    Feeding    LATCH Score                   Interventions    Lactation Tools Discussed/Used     Consult Status      Zadrian Mccauley Venetia Constable 01/09/2020, 2:45 PM

## 2020-01-12 ENCOUNTER — Encounter: Payer: Self-pay | Admitting: Obstetrics & Gynecology

## 2020-01-12 ENCOUNTER — Other Ambulatory Visit: Payer: Self-pay

## 2020-01-17 ENCOUNTER — Ambulatory Visit: Payer: Self-pay | Admitting: Certified Nurse Midwife

## 2020-01-20 ENCOUNTER — Ambulatory Visit: Payer: Self-pay

## 2020-01-20 NOTE — Lactation Note (Signed)
This note was copied from a baby's chart. Lactation Consultation Note  Patient Name: Megan Fuller DXIPJ'A Date: 01/20/2020 Reason for consult: Follow-up assessment;1st time breastfeeding;Primapara;NICU baby;Infant < 6lbs;Preterm <34wks;Maternal endocrine disorder Type of Endocrine Disorder?: PCOS  Visited with mom of a 54 week old pre-term NICU female, baby is < 2 lbs. LC Lisa asked this LC to check on mom, she's a Spanish speaker and hasn't seen lactation in nearly 2 weeks. NICU RN Eunice Blase reported to [facility] that mom is not really bringing milk from home; she thinks she's just pumping at the hospital.  When [facility] spoke to mom, she reassured that she's doing everything she's been told, she's pumping every 3 hours at home but she's only getting drops of colostrum; she told LC that 20 ml of EBM that she turns to the NICU for her baby is what she pumps throughout the day.  Mom also told LC that she has PCOS and she believes that's the reason why her milk hasn't fully come into volume. Mom has an appt with her OBGYN next Thursday, Dec. 2nd for her post-partum check up, she also told LC that she had to go to the ED to get the staples from her C/S removed because she didn't have any F/U appt.   LC had to wait for mom to finish talking to NICU staff prior entering the room, staff were using the video interpreter when entering the room. Reviewed pumping schedule, galactagogues, breastmilk storage guidelines and milk banking in [place]. She voiced she's concerned about not having human milk available for her baby once he gets discharged from the NICU.  Feeding plan:  1. Encouraged mom to continue pumping every 3 hours, at least 8 pumping sessions in 24 hours 2. Next LC will provide mom with [facility] brochure to anticipate options for human milk feeding beyond NICU stay 3. She'll check with OBGYN next week regarding PCOS and if this is the reason why her supply is so diminished  No support person in baby's room  other than mom at the time of [facility] consultation. Mom reported all questions and concerns were answered, she's aware of LC OP services and will call PRN.   Maternal Data    Feeding    LATCH Score                   Interventions Interventions: Breast feeding basics reviewed;DEBP  Lactation Tools Discussed/Used Tools: Pump Flange Size: 24 Breast pump type: Double-Electric Breast Pump   Consult Status Consult Status: Follow-up Date: 01/21/20 (needs [facility] milk bank brochure) Follow-up type: In-patient    Kostantinos Tallman Venetia Constable 01/20/2020, 10:08 PM

## 2020-01-22 ENCOUNTER — Ambulatory Visit: Payer: Self-pay

## 2020-01-22 NOTE — Lactation Note (Signed)
This note was copied from a baby's chart. Lactation Consultation Note  Patient Name: Megan Fuller TOIZT'I Date: 01/22/2020 Reason for consult: Follow-up assessment;NICU baby;Preterm <34wks Type of Endocrine Disorder?: PCOS  1027 - 1059 - I followed up with Ms. Denita Lung upon RN request. Wonda CeriseRoddie Mc.  I asked Ms. Pineda-Lovo if she had any new questions or concerns since she was seen by [facility] on 11/25. She states that her main concern is that she is not producing milk, and she wants to make sure that she can provide her baby breast milk after discharge. She has explored some donor milk websites and states that she cannot afford to buy breast milk.  I discouraged her from buying breast milk.  We discussed her milk production. Ms. Daryll Brod states that she was compliant with pumping every three hours consistently for many days, and that she did not produce any milk. She states that one day she took the day "off" of pumping and only pumped one time. She produced the same amount of milk in that one pump that she made in a full day of pumping.  Ms. Antonucci states that she has PCOS, and she feels this is a major contributing factor in her low milk production.  She is interested in medications to increase milk production; she plans to discuss this with her provider at a follow up appointment on December 2.  I discussed how pumping consistently is important for milk production and stimulation. She expressed that it was not working for her. We then turned our attention to donor milk options after discharge. She asked for help with navigating her options as she has a language barrier to many websites that are in [place].  I suggested that I could speak with members of the NICU lactation team to determine next best steps. She thanked me and agreed to follow up later next week.   Feeding Feeding Type: Donor Breast Milk  Interventions Interventions: Breast feeding basics  reviewed  Lactation Tools Discussed/Used WIC Program: Yes Pump Review: Setup, frequency, and cleaning   Consult Status Consult Status: Follow-up Date: 01/28/20 Follow-up type: In-patient    Walker Shadow 01/22/2020, 11:09 AM

## 2020-01-26 ENCOUNTER — Ambulatory Visit: Payer: Self-pay

## 2020-01-26 NOTE — Lactation Note (Signed)
This note was copied from a baby's chart. Lactation Consultation Note  Patient Name: Megan Fuller CBSWH'Q Date: 01/26/2020 Reason for consult: Follow-up assessment;NICU baby;Maternal endocrine disorder Type of Endocrine Disorder?: PCOS  LC f/u with mom of NICU infant. Mom is pumping 4 x day with very low output of 5-6mLs. This LC discussed, at length, maternal hx and strategies to increase supply. Mother has OB f/u tomorrow and will discuss option of Reglan to increase supply. This LC will plan f/u with mom in about 1 week.  Interventions Interventions: Breast compression;DEBP;Hand express;Breast massage;Expressed milk, interpreter services: Jeannett Senior #[id number]    Consult Status Consult Status: Follow-up Date: 01/27/20 Follow-up type: In-patient    Elder Negus 01/26/2020, 2:14 PM

## 2020-02-03 ENCOUNTER — Encounter: Payer: Self-pay | Admitting: Obstetrics & Gynecology

## 2020-02-03 ENCOUNTER — Ambulatory Visit (INDEPENDENT_AMBULATORY_CARE_PROVIDER_SITE_OTHER): Payer: Self-pay | Admitting: Clinical

## 2020-02-03 ENCOUNTER — Other Ambulatory Visit: Payer: Self-pay

## 2020-02-03 ENCOUNTER — Ambulatory Visit (INDEPENDENT_AMBULATORY_CARE_PROVIDER_SITE_OTHER): Payer: Self-pay | Admitting: Obstetrics & Gynecology

## 2020-02-03 VITALS — BP 133/92 | HR 68 | Wt 181.1 lb

## 2020-02-03 DIAGNOSIS — Z1331 Encounter for screening for depression: Secondary | ICD-10-CM

## 2020-02-03 DIAGNOSIS — Z658 Other specified problems related to psychosocial circumstances: Secondary | ICD-10-CM

## 2020-02-03 DIAGNOSIS — F39 Unspecified mood [affective] disorder: Secondary | ICD-10-CM

## 2020-02-03 DIAGNOSIS — Z98891 History of uterine scar from previous surgery: Secondary | ICD-10-CM

## 2020-02-03 DIAGNOSIS — F53 Postpartum depression: Secondary | ICD-10-CM

## 2020-02-03 DIAGNOSIS — O99345 Other mental disorders complicating the puerperium: Secondary | ICD-10-CM

## 2020-02-03 MED ORDER — SERTRALINE HCL 25 MG PO TABS: 25.0000 mg | ORAL_TABLET | Freq: Every day | ORAL | 2 refills | Status: DC

## 2020-02-03 MED ORDER — METOCLOPRAMIDE HCL 5 MG PO TABS: 5.0000 mg | ORAL_TABLET | Freq: Three times a day (TID) | ORAL | 1 refills | Status: DC

## 2020-02-03 MED ORDER — DROSPIRENONE-ETHINYL ESTRADIOL 3-0.02 MG PO TABS: 1.0000 | ORAL_TABLET | Freq: Every day | ORAL | 11 refills | Status: DC

## 2020-02-03 NOTE — Patient Instructions (Signed)
[facility] °[address] °[place], [place] [address] °[phone] (main office) °[phone] (Lynsee Wands's office) ° °Behavioral Health Resources:  ° °What if I or someone I know is in crisis? ° °If you are thinking about harming yourself or having thoughts of suicide, or if you know someone who is, seek help right away. ° °Call your doctor or mental health care provider. ° °Call 911 or go to a hospital emergency room to get immediate help, or ask a friend or family member to help you do these things; IF YOU ARE IN [place], YOU MAY GO TO WALK-IN URGENT CARE 24/7 at [facility] (see below) ° °Call the [place] National Suicide Prevention Lifeline’s toll-free, 24-hour hotline at 1-800-273-TALK (1-800-273-8255) or TTY: 1-800-799-4 TTY (1-800-799-4889) to talk to a trained counselor. ° °If you are in crisis, make sure you are not left alone.  ° °If someone else is in crisis, make sure he or she is not left alone ° ° °24 Hour :  ° °[facility]  °[address], [place], [place] [address] 800-711-2635 or [phone] °WALK-IN URGENT CARE 24/7 ° °[facility] Mobile Crisis: 1-877-626-1772 ° °[place] National Suicide Hotline: 1-800-273-8255 ° °[facility] Crisis Line °(Domestic Violence, Rape & Victim Assistance)  336-273-7273 ° °[facility]  °[address] [place], [place]  [address]   1-855-788-8787 or 336-676-6840  ° °[facility] [place] [facility]: 336-899-1505 (8am-4pm) or 1-866- 261-5769 (after hours)      ° ° °[facility] °24/7 Walk-in Clinic, [address], [place], [place]  [phone] °Fax: [phone] [facility] °*Interpreters available °*Accepts all insurance and uninsured for Urgent Care needs °*Accepts Medicaid and uninsured for outpatient treatment  ° °[facility]   °Mon-Fri: 8am-5pm °[address], [place], [place] [phone](phone); 336-272-9885(fax) [facility]  °*Accepts Medicare ° °[facility] °Mon, Tues, Thurs, Fri: 8am-4pm °[address], [place], [place] [address] °[phone] (phone); 336-292-0679 (fax) °[facility]  °*Accepts Medicare ° °[facility] °Mon-Fri: 9am-5pm  °[address], [place], [place] °336-540-9400 (phone); 336-540-9454  °[facility]  °*Accepts Medicaid ° °[facility] °[address], [place], [place]  °336-274-2040 °http://[facility].com  ° °[facility] °Mon-Fri, 8:30am-12pm/1pm-2:30pm °[address], [place], [place] [phone] (phone); 336-387-9167 (fax) °www.[facility].org  °*Accepts Medicaid, sliding-scale*Bilingual services available ° °[facility] °Mon-Fri, 8am-7pm °[address], [place], [place]  °[phone](phone); 336-899-8811(fax) °www.[facility].org  °*Accepts Medicaid *Bilingual services available ° °[facility] °Mon-Fri: 8am-5pm, Saturday by appointment only °[address], [place], [place] °336-294-1349 (phone); 336-292-6711 (fax) °www.[facility].com  ° °[facility] °[address], [place], [place] °336-429-5600 °www.[facility]org  °*Free & reduced services for uninsured and underinsured individuals °*Bilingual services for Spanish-speaking clients 21 and under ° °[facility] °24/7 Walk-in Clinic, [address], [place], [place] °336-676-6409(phone); 336-676-6409(fax) °www.[facility]  °*Bring your own interpreter at first visit °*Accepts Medicare and Medicaid ° °[facility] °Mon-Fri: 9am-5:30pm °[address], [place], [place] °[phone] (phone), [phone] (fax) °After hours crisis line:  336-763-1165 °[facility]  °*Accepts Medicare and Medicaid ° °[facility] °Mon-Thurs, 8am-6pm °[address], [place], [place]  °336-288-1484 (phone); 336-288-0738 (fax) °[facility]://[facility]  °*Subsidized costs available ° °[facility] °Mon-Fri: 8am-4pm °[address], [place], [place] °336-834-9664(phone); 336-834-9698(fax) °[facility].com  °*Accepts Medicaid ° °[facility] [place] [facility] °Same day access hours: Mon-Fri, 8:30-3pm °Crisis hours: Mon-Fri,   8am-5pm °[address], [place], [place] °([phone]) [phone] ° °[facility] °Same day access hours: Mon-Fri, 8:30-3pm °Crisis hours: Mon-Fri, 8am-8pm °[address], [place], [place] °[phone] (phone); 336-899-1513 (fax) °[facility]  °*Accepts Medicaid and Medicare ° °[facility] °Mon, Wed, Fri: 9am-9pm °Tues, Thurs: 9am-6pm °[address], [place], [place]  °336-379-7146 (phone); 336-379-7145 (fax) °https://[facility].com  °*(Accepts Medicare and Medicaid; payment plans available)*Bilingual services available ° °[facility] °[address], [place], [place] °336-542-2076 (phone); 336-272-1182 (fax) °[facility]  ° °[facility] °[address], [place], [place]  °336-663-6570  °www.[facility]com  °*Bilingual services available ° °[facility] °Mon-Thurs: 8am-8pm °[address], [place], [place] °336-285-7173 (phone); 336-285-7174 (fax) °https://[facility].com/index.html  °*Accepts Medicaid*Bilingual services available ° °[facility] °[address] [place], [place] °336-617-8910 (phone) °https://[facility].com  °*Accepts Medicaid °*Bilingual services available ° °[facility] °Mon-Fri, 9am-4:45pm °[address], [place], [place] °336-288-9190 (phone); [phone]  (fax) °http://[facility].com  °*Accepts Medicare ° °[facility] °Mon-Thurs: 8:30-8pm, Fri: 8:30am-7pm °[address], [place], [place] (3rd floor) °336-334-5662 (phone); 336-334-5754 (fax) °[facility]://[facility]/clinic  °*Accepts Medicaid; income-based reduced rates available ° °[facility] °Mon-Fri: 8am-5pm °[address], [place], [place] [address] °336-542-2884 (phone); 336-542-2885 (fax) °http://[facility].com  °*Accepts Medicaid*Bilingual services available ° ° °[facility]  °[address], [place] °336-373-1402 www.mhag.org  °*Provides direct services to individuals in recovery from mental illness, including support groups, recovery skills classes, and one on one peer support ° °NAMI (National Alliance on Mental Illness) °[place] [facility]I helpline: 336-370-4264  °NAMI  helpline: 1-800-451-9682 °https://namiguilford.org  °*A community hub for information relating to local resources and services for the friends and families of individuals living alongside a mental health condition, as well as the individuals themselves. Classes and support groups also provided  ° ° °

## 2020-02-03 NOTE — Progress Notes (Signed)
    Post Partum Visit Note Spanish Interpreter Nohella T.    Megan Fuller is a 34 y.o. G79P0101 female who presents for a postpartum visit. She is 6 weeks postpartum following a primary cesarean section.  I have fully reviewed the prenatal and intrapartum course. The delivery was at 26 gestational weeks.  Anesthesia: general. Postpartum course has been complicated by postpartum depression. Baby is doing well currently in NCU. Baby is feeding by breast. Bleeding no bleeding. Bowel function is normal. Bladder function is normal. Patient is not sexually active. Contraception method is none. Postpartum depression screening: positive.   The pregnancy intention screening data noted above was reviewed. Potential methods of contraception were discussed. The patient elected to proceed with Oral Contraceptive.      The following portions of the patient's history were reviewed and updated as appropriate: allergies, current medications, past family history, past medical history, past social history, past surgical history and problem list.  Review of Systems Pertinent items are noted in HPI.    Objective:  There were no vitals taken for this visit.   General:  alert and cooperative   Breasts:  inspection negative, no nipple discharge or bleeding, no masses or nodularity palpable  Lungs: clear to auscultation bilaterally  Heart:  regular rate and rhythm, S1, S2 normal, no murmur, click, rub or gallop  Abdomen: soft, non-tender; bowel sounds normal; no masses,  no organomegaly        Assessment:    Normal postpartum exam. Pap smear not done at today's visit. Incision healing well.   Plan:   Essential components of care per ACOG recommendations:  1.  Mood and well being: Patient with positive depression screening today. Reviewed local resources for support. Started on zoloft, counseling today as well. Denies Si/Hi - Patient does not use tobacco.   2. Infant care and feeding:  -Patient  currently breastmilk feeding? Yes If breastmilk feeding discussed return to work and pumping. If needed, patient was provided letter for work to allow for every 2-3 hr pumping breaks, and to be granted a private location to express breastmilk and refrigerated area to store breastmilk. Reviewed importance of draining breast regularly to support lactation.   3. Sexuality, contraception and birth spacing - Patient does not want a pregnancy in the next year.  Desired family size is 3 children.  - Reviewed forms of contraception in tiered fashion. Patient desired oral contraceptives (estrogen/progesterone) today.   - Discussed birth spacing of 18 months  4. Sleep and fatigue -Encouraged family/partner/community support of 4 hrs of uninterrupted sleep to help with mood and fatigue  5. Physical Recovery  - Discussed patients delivery and complications - Patient is safe to resume physical and sexual activity   Malachy Chamber, MD [facility] [employer]

## 2020-02-03 NOTE — BH Specialist Note (Signed)
Integrated Behavioral Health Initial In-Person Visit  MRN: [id number] Name: Megan Fuller  Number of Integrated Behavioral Health Clinician visits:: 1/6 Session Start time: 3:53 Session End time: 4:28 Total time: 37 minutes  Types of Service: Individual psychotherapy  Interpretor:Yes.   Interpretor Name and Language: Spanish   Warm Hand Off Completed.       Subjective: Megan Fuller is a 34 y.o. female accompanied by n/a Patient was referred by Raynelle Dick, MD for positive depression screen. Patient reports the following symptoms/concerns: Pt states her primary concern today is feeling depressed, attributed with complicated relationship with FOB while baby is in NICU, along with financial stress of being uninsured  Duration of problem: Start in pregnancy, increase postpartum w baby in NICU; Severity of problem: severe  Objective: Mood: Anxious and Depressed and  : Depressed Risk of harm to self or others: Suicidal ideation No plan to harm self or others  Life Context: Family and Social: Pt lives by herself; baby in NICU School/Work: - Self-Care: - Life Changes: Recent first childbirth with baby in NICU after break-up w fiance/complicated relationship  Patient and/or Family's Strengths/Protective Factors: Sense of purpose  Goals Addressed: Patient will: 1. Reduce symptoms of: anxiety, depression, mood instability and stress 2. Demonstrate ability to: Increase adequate support systems for patient/family, Increase motivation to adhere to plan of care and Safety  Progress towards Goals: Ongoing  Interventions: Interventions utilized: Psychoeducation and/or Health Education and Safety  Standardized Assessments completed: GAD-7 and PHQ 9  Patient and/or Family Response: Pt agrees to treatment plan  Patient Centered Plan: Patient is on the following Treatment Plan(s):  IBH  Assessment: Patient currently experiencing Mood disorder, unspecified and  Psychosocial stress.   Patient may benefit from psychoeducation and brief therapeutic interventions regarding coping with symptoms of dpression, anxiety, mood instability  Plan: 1. Follow up with behavioral health clinician on : One week for phone follow up; Call Asher Muir at [phone] if any questions or concerns within one week 2. Behavioral recommendations:  -Follow Safety Plan ([facility] to [facility] if thoughts return) -Begin taking Zoloft as prescribed by medical provider -Continue with plan to visit son twice daily until he comes home -Register for online new mom support group "[employer]" at www.postpartum.net this week for additional support -Consider establishing with Spanish-speaking therapist of choice, as options were discussed  3. Referral(s): Integrated [employer] (In Clinic) and [employer]:  new mom support  Rae Lips, [place]  Depression screen [facility] 2/9 02/03/2020 12/14/2019 10/24/2014  Decreased Interest 2 3 0  Down, Depressed, Hopeless 2 3 0  PHQ - 2 Score 4 6 0  Altered sleeping 3 3 -  Tired, decreased energy 3 3 -  Change in appetite 3 3 -  Feeling bad or failure about yourself  0 2 -  Trouble concentrating 3 2 -  Moving slowly or fidgety/restless 3 2 -  Suicidal thoughts 2 2 -  PHQ-9 Score 21 23 -   GAD 7 : Generalized Anxiety Score 02/03/2020  Nervous, Anxious, on Edge 2  Control/stop worrying 2  Worry too much - different things 3  Trouble relaxing 2  Restless 2  Easily annoyed or irritable 3  Afraid - awful might happen 3  Total GAD 7 Score 17

## 2020-02-06 NOTE — Progress Notes (Signed)
02/06/2020 @ 2115  Called by staff due to noncompliance with wearing masks. When I entered the room (3S18), neither the MOB nor the FOB were wearing a mask. The MOB was actually sitting on hers. I introduced myself and asked them to please put their masks on.  Per staff, education had already been given regarding compliance.  I explained that noncompliance would not be tolerated.  I explained that if they chose to not comply they would be asked to leave. They both expressed understanding and I left the room.  I informed the Charge RN that if they chose to continue to not wear masks to call security to ask them to leave.  A short time later, I was asked to come back to the room for further conversation via an interpreter.  I asked security to accompany in the event of a failure to comply.  Compliance was again asked for and the consequence for not doing so was again expressed via video interpreter. They then stated "this was the first time we heard about the mask."  I expressed doubt in this statement as the baby has been a patient for nearly six weeks, not to mention the MOB was a pt for the antepartum and delivery process.  I pointed out that we have signs at every entrance that states that "You must wear a mask" and that it was also written in spanish, so there is no excuse for noncompliance.  MOB and FOB expressed through the interpreter that they were unhappy with my tone and behavior during my first visit.  I apologized for their interpretation of my tone and again expressed that compliance was expected and the consequences of not doing so would result in them being asked to leave.  They asked to speak to someone above me. I explained that that was not an option tonight.  The FOB then stated "we will just take the baby and leave."  I explained that they have that right but, "I strongly advise against that because your baby is critically ill and needs to be in the hospital.  Your baby could die if you  choose to leave with him."  He then stated "you can't stop [place]."  I explained that if that's the choice they made, we would have no choice but to report it to the appropriate authorities and that would not make their situation any better at all. "Most importantly, your baby could die if you take him from here.  All over not wearing a mask?"    MOB then asked that the Charge RN not come back to the room.  The Charge RN explained that if something was wrong with the baby and she were the only person available to respond, she would do so because the baby is what's important to [place] and we will do whatever we can to care for your baby."  The MOB while addressing the Charge RN expressed "you are so nosey; you just want to be nosey; this wouldn't happen if you weren't being nosey."    The FOB began making comments "do you feel like you are big or something"; "I think you are doing this because we are Hispanic; you must be a racist; "we see people smoking weed in the parking lot and across the street but you don't do anything about that."  The MOB stated multiple times "I'm not sure why you are making [place] do this, these masks mean nothing."  I expressed that compliance with our  rules has nothing to do with race. I then stated "I am going to leave now.  Please wear your masks or you will be escorted out.  If we receive another report regarding noncompliance with any of our rules, you will be asked to leave."  Of note, both MOB and FOB continuously used expletives while speaking in spanish to the interpreter.  Primarily, the interpreter was used to express their view of me being "unprofessional and screaming at them."  Also, there are notes that indicate that interpreter services were previously offered for other conversations but were refused.   The Charge RN explained to them that a member of her leadership team would come by to talk with them at some point tomorrow.  Security lead was briefed on the situation.   I explained that we are finished talking about the rules.  If they received a call regarding noncompliance with any [place] rules to simply escort them out of the building.

## 2020-02-10 ENCOUNTER — Telehealth: Payer: Self-pay | Admitting: Clinical

## 2020-02-10 NOTE — Telephone Encounter (Signed)
Follow up call, as agreed-upon by [facility] and Pt; pt says she decided not to start taking Zoloft; that she "became aware" of feeling "disconnected" and depressed state after her first visit with [facility], and decided to change her mindset and actions. Pt began talking to her family(sisters, mother, cousins) as well as Civil Service fast streamer within the NICU department, along with staying busy at home getting baby room ready, which has all helped her feel much better. Pt denies any SI in at least over one week now.   Pt agrees to attend f/u visit with [facility] virtually on 03/02/2019, and f/u in person visit with Dr Donavan Foil on 03/07/19.

## 2020-02-16 ENCOUNTER — Ambulatory Visit: Payer: Self-pay

## 2020-02-16 NOTE — Lactation Note (Signed)
This note was copied from a baby's chart. Lactation Consultation Note  Patient Name: Megan Fuller [id number] Date: 02/16/2020   [facility] attempted to see MOB today.  Spoke with baby's RN.  Unsure if MOB is pumping anymore as she hasn't been bringing any EBM in for baby.    RN will ask Mom if she would like a LC consult.    [name] 02/16/2020, 3:20 PM

## 2020-02-23 NOTE — BH Specialist Note (Signed)
Integrated Behavioral Health Follow Up In-Person Visit  MRN: [id number] Name: Megan Fuller  Number of Integrated Behavioral Health Clinician visits: 2/6 Session Start time: 9:35  Session End time:9:50 Total time: 15 minutes  Types of Service: Individual psychotherapy  Interpretor:Yes.   Interpretor Name and Language: Megan Fuller [id number]  Subjective: Megan Fuller is a 34 y.o. female accompanied by n/a Patient was referred by Raynelle Dick, MD for positive depression screen. Patient reports the following symptoms/concerns: Pt states she is adjusting to baby in NICU, feeling much better after recognizing and talking through her feelings at last visit with no SI; primary concern today is food insecurity as she receives [facility] only, not EBT.  Duration of problem: Postpartum; Severity of problem: moderate  Objective: Mood: Normal and Affect: Appropriate Risk of harm to self or others: No plan to harm self or others  Life Context: Family and Social: Pt visits baby in NICU twice daily; lives by herself and feels well-supported by hospital staff School/Work: - Self-Care: - Life Changes: First baby born and in NICU, after breakup with fiance  Patient and/or Family's Strengths/Protective Factors: Social connections and Sense of purpose  Goals Addressed: Patient will: 1.  Reduce symptoms of: anxiety, depression, mood instability and stress  2.  Increase knowledge and/or ability of: stress reduction  Progress towards Goals: Ongoing  Interventions: Interventions utilized:  Solution-Focused Strategies Standardized Assessments completed: GAD-7 and PHQ 9  Patient and/or Family Response: Pt agrees to revised treatment plan   Patient Centered Plan: Patient is on the following Treatment Plan(s): IBH Assessment: Patient currently experiencing Mood disorder, unspecified, and Psychosocial stress.   Patient may benefit from continued psychoeducation and brief therapeutic interventions  regarding coping with symptoms of depression, anxiety, and life stress .  Plan: 1. Follow up with behavioral health clinician on : As needed 2. Behavioral recommendations:  -Continue to follow Safety Plan if SI returns [facility], as discussed) -Continue visiting son twice daily in NICU -Continue to consider registering for online new mom support group "[facility]in[facility]" at [email or web address] -Take home food from [employer] today -Continue to consider establishing care with Spanish-speaking therapist of choice, as discussed at initial visit 3. Referral(s): Integrated Art gallery manager (In Clinic) and [employer]:  Food 4. "From scale of 1-10, how likely are you to follow plan?":  5.   Rae Lips, LCSW       Depression screen [facility] 2/9 03/01/2020 02/03/2020 12/14/2019 10/24/2014  Decreased Interest 2 2 3  0  Down, Depressed, Hopeless 2 2 3  0  PHQ - 2 Score 4 4 6  0  Altered sleeping 1 3 3  -  Tired, decreased energy 2 3 3  -  Change in appetite 1 3 3  -  Feeling bad or failure about yourself  1 0 2 -  Trouble concentrating 1 3 2  -  Moving slowly or fidgety/restless 1 3 2  -  Suicidal thoughts 0 2 2 -  PHQ-9 Score 11 21 23  -  Difficult doing work/chores Somewhat difficult - - -   GAD 7 : Generalized Anxiety Score 03/01/2020 02/03/2020  Nervous, Anxious, on Edge 0 2  Control/stop worrying 0 2  Worry too much - different things 0 3  Trouble relaxing 1 2  Restless 2 2  Easily annoyed or irritable 1 3  Afraid - awful might happen 1 3  Total GAD 7 Score 5 17

## 2020-03-01 ENCOUNTER — Ambulatory Visit: Payer: Self-pay

## 2020-03-01 ENCOUNTER — Ambulatory Visit: Payer: Self-pay | Admitting: Clinical

## 2020-03-01 DIAGNOSIS — F39 Unspecified mood [affective] disorder: Secondary | ICD-10-CM

## 2020-03-01 DIAGNOSIS — Z658 Other specified problems related to psychosocial circumstances: Secondary | ICD-10-CM

## 2020-03-01 NOTE — Lactation Note (Signed)
This note was copied from a baby's chart. Lactation Consultation Note  Patient Name: Megan Fuller Date: 03/01/2020 Reason for consult: Follow-up assessment;Mother's request;NICU baby Age:33 m.o.   [name] called lactation into room to ask if there was any way to get breast milk to her baby. I stated that I could pass her request along to the lactation team, but I could not make that decision for her. She verbalized understanding.     Consult Status Consult Status: Follow-up Date: 03/02/20 Follow-up type: In-patient    Walker Shadow 03/01/2020, 1:58 PM

## 2020-03-01 NOTE — Lactation Note (Signed)
This note was copied from a baby's chart. Lactation Consultation Note  Patient Name: Megan Fuller AESLP'N Date: 03/01/2020 Reason for consult: Follow-up assessment;NICU baby;Preterm <34wks Age:33 m.o.  I followed up with [name]. She states that she has discontinued breast pumping due to low milk production. She has worked with her provider, and she indicated that she took a medication (Reglan) to try to increase her volume. However, she states no change. She has tried pumping 6+ times a day with only small amounts. [name] has worked with lactation here as well as the [facility] lactation provider (not sure if it was an Printmaker or a International aid/development worker). She attributes her difficulty with milk production to PCOS. Her decision to stop pumping is due to feeling discouraged about her production, but she does want baby to receive breast milk.  I validated her concerns and provided reassurance. [name] expressed that she would prefer her son, Megan Fuller, now [redacted]w[redacted]d PMA receive donor breast milk, but he is now receiving formula.  I stated that I would share her feedback with the lactation team, and if we could offer any new suggestions or hints that we would follow up. I thanked her for her time.    Feeding Feeding Type: Formula   Consult Status Consult Status: Follow-up Date: 03/02/20 Follow-up type: In-patient    Walker Shadow 03/01/2020, 10:31 AM

## 2020-03-01 NOTE — Patient Instructions (Signed)
Center for [facility] at [facility] [address] [place], [place] [address] [phone] (main office) [phone] (Alaina Donati's office)  [email or web address] [facility] Perinatal)

## 2020-03-06 ENCOUNTER — Encounter: Payer: Self-pay | Admitting: Obstetrics and Gynecology

## 2020-03-06 ENCOUNTER — Ambulatory Visit (INDEPENDENT_AMBULATORY_CARE_PROVIDER_SITE_OTHER): Payer: Self-pay | Admitting: Obstetrics and Gynecology

## 2020-03-06 ENCOUNTER — Other Ambulatory Visit: Payer: Self-pay

## 2020-03-06 VITALS — BP 128/85 | HR 74 | Ht 64.0 in | Wt 180.3 lb

## 2020-03-06 DIAGNOSIS — D693 Immune thrombocytopenic purpura: Secondary | ICD-10-CM

## 2020-03-07 LAB — CBC
Hematocrit: 39.3 % (ref 34.0–46.6)
Hemoglobin: 13.5 g/dL (ref 11.1–15.9)
MCH: 32.9 pg (ref 26.6–33.0)
MCHC: 34.4 g/dL (ref 31.5–35.7)
MCV: 96 fL (ref 79–97)
Platelets: 94 10*3/uL — CL (ref 150–450)
RBC: 4.1 x10E6/uL (ref 3.77–5.28)
RDW: 12 % (ref 11.7–15.4)
WBC: 4 10*3/uL (ref 3.4–10.8)

## 2020-03-07 NOTE — Progress Notes (Signed)
  CC: patient education. Subjective:    Patient ID: Megan Fuller, female    DOB: 1985/10/12, 35 y.o.   MRN: [id number]  HPI Pt seen.  Her chart was previously reviewed.  She had her postpartum visit previously.  Translator was present, but it was still unclear why she has returned to the office.  Pt had a few questions about her delivery and postpartum care, all of which were answered to her satisfaction.  Pt's platelets were noted to be decreased at time of delivery due to her ITP.  Pt still has not received follow up care from the specialist.   Review of Systems     Objective:   Physical Exam Vitals:   03/06/20 1459  BP: 128/85  Pulse: 74         Assessment & Plan:   1. Chronic ITP (idiopathic thrombocytopenia) (HCC) Check CBC to evaluate platelets and refer pt to hematology for long term management of the condition.  - CBC - Ambulatory referral to Hematology  I spent 10 minutes dedicated to the care of this patient including previsit review of records, face to face time with the patient discussing her cesarean section and and postpartum care and post visit testing.  F/u in 1 year or PRN  Warden Fillers, MD Faculty Attending, Center for [facility]

## 2020-03-08 ENCOUNTER — Telehealth: Payer: Self-pay | Admitting: *Deleted

## 2020-03-08 DIAGNOSIS — N939 Abnormal uterine and vaginal bleeding, unspecified: Secondary | ICD-10-CM

## 2020-03-08 DIAGNOSIS — D693 Immune thrombocytopenic purpura: Secondary | ICD-10-CM

## 2020-03-08 MED ORDER — TRANEXAMIC ACID 650 MG PO TABS: 1300.0000 mg | ORAL_TABLET | Freq: Three times a day (TID) | ORAL | 0 refills | Status: DC

## 2020-03-08 NOTE — Telephone Encounter (Addendum)
-----   Message from Warden Fillers, MD sent at 03/08/2020  8:18 AM EST ----- Platelets improved from pregnancy, but still below normal.  Still advise follow up with hematologist  for future management.  Pt will be informed.    1/12  1350 Called pt w/interpreter Eda Royal. She was advised of test results still showing low platelets and recommendation from Dr. Donavan Foil for follow up with Hematologist. Pt stated that her period began yesterday and that she is bleeding very heavily. She is not able to use pads due to sensitivity to the cotton they are made with. She uses "bunched up" paper towels and reports that every time she moves, she leaks out a large amount of blood which runs down her legs. I advised pt that I will see if a prescription can be sent in to reduce the bleeding. Also, she will be notified of Hematology appt once it has been scheduled. Pt voiced understanding.   1/12  1655  Called pt w/interpreter Eda Royal and informed her of Rx sent to pharmacy to help control bleeding. Dosage instructions were given - 2 tablets 3 times daily for first 4-5 days of cycle. She may only take for a maximum of 5 days. . Pt was also advised that I was not able to schedule the Hematology appointment because they are closed. I will call back tomorrow. Pt voiced understanding.   1/26  1350  Called pt w/[employer] interpreter [phone] and informed her of Hematology referral appointment on 2/1 @ 10:00 am. She should arrive @ 9:30 and the appointment will take approximately one hour. Location address was provided. Pt voiced understanding.

## 2020-03-12 ENCOUNTER — Encounter (HOSPITAL_COMMUNITY): Payer: Self-pay | Admitting: Family Medicine

## 2020-03-22 ENCOUNTER — Telehealth: Payer: Self-pay | Admitting: Hematology and Oncology

## 2020-03-22 NOTE — Telephone Encounter (Signed)
Received a new hem referral from Dr. Donavan Foil for chronic ITP. Pt has been scheduled to see Dr. Al Pimple on 2/1 at 10am. Referring office will notify the pt.

## 2020-03-28 ENCOUNTER — Inpatient Hospital Stay: Payer: No Typology Code available for payment source | Attending: Hematology | Admitting: Hematology and Oncology

## 2020-03-28 ENCOUNTER — Inpatient Hospital Stay: Payer: No Typology Code available for payment source

## 2021-03-28 ENCOUNTER — Emergency Department (HOSPITAL_COMMUNITY)
Admission: EM | Admit: 2021-03-28 | Discharge: 2021-03-29 | Disposition: A | Discharge status: Home / Self Care | Payer: Self-pay | Attending: Physician Assistant | Admitting: Physician Assistant

## 2021-03-28 ENCOUNTER — Encounter (HOSPITAL_COMMUNITY): Payer: Self-pay

## 2021-03-28 DIAGNOSIS — R300 Dysuria: Secondary | ICD-10-CM | POA: Insufficient documentation

## 2021-03-28 DIAGNOSIS — N9489 Other specified conditions associated with female genital organs and menstrual cycle: Secondary | ICD-10-CM | POA: Insufficient documentation

## 2021-03-28 DIAGNOSIS — R1030 Lower abdominal pain, unspecified: Secondary | ICD-10-CM | POA: Insufficient documentation

## 2021-03-28 DIAGNOSIS — Z5321 Procedure and treatment not carried out due to patient leaving prior to being seen by health care provider: Secondary | ICD-10-CM | POA: Insufficient documentation

## 2021-03-28 LAB — COMPREHENSIVE METABOLIC PANEL
ALT: 38 U/L (ref 0–44)
AST: 56 U/L — ABNORMAL HIGH (ref 15–41)
Albumin: 3.3 g/dL — ABNORMAL LOW (ref 3.5–5.0)
Alkaline Phosphatase: 120 U/L (ref 38–126)
Anion gap: 5 (ref 5–15)
BUN: 10 mg/dL (ref 6–20)
CO2: 26 mmol/L (ref 22–32)
Calcium: 8.7 mg/dL — ABNORMAL LOW (ref 8.9–10.3)
Chloride: 107 mmol/L (ref 98–111)
Creatinine, Ser: 0.97 mg/dL (ref 0.44–1.00)
GFR, Estimated: >60 mL/min (ref >60)
Glucose, Bld: 89 mg/dL (ref 70–99)
Potassium: 3.9 mmol/L (ref 3.5–5.1)
Sodium: 138 mmol/L (ref 135–145)
Total Bilirubin: 1.7 mg/dL — ABNORMAL HIGH (ref 0.3–1.2)
Total Protein: 6.8 g/dL (ref 6.5–8.1)

## 2021-03-28 LAB — CBC WITH DIFFERENTIAL/PLATELET
Abs Immature Granulocytes: 0.01 10*3/uL (ref 0.00–0.07)
Basophils Absolute: 0.1 10*3/uL (ref 0.0–0.1)
Basophils Relative: 1 %
Eosinophils Absolute: 0.2 10*3/uL (ref 0.0–0.5)
Eosinophils Relative: 4 %
HCT: 38.1 % (ref 36.0–46.0)
Hemoglobin: 13.5 g/dL (ref 12.0–15.0)
Immature Granulocytes: 0 %
Lymphocytes Relative: 29 %
Lymphs Abs: 1.4 10*3/uL (ref 0.7–4.0)
MCH: 34.2 pg — ABNORMAL HIGH (ref 26.0–34.0)
MCHC: 35.4 g/dL (ref 30.0–36.0)
MCV: 96.5 fL (ref 80.0–100.0)
Monocytes Absolute: 0.3 10*3/uL (ref 0.1–1.0)
Monocytes Relative: 6 %
Neutro Abs: 2.9 10*3/uL (ref 1.7–7.7)
Neutrophils Relative %: 60 %
Platelets: 77 10*3/uL — ABNORMAL LOW (ref 150–400)
RBC: 3.95 MIL/uL (ref 3.87–5.11)
RDW: 12.8 % (ref 11.5–15.5)
WBC: 4.8 10*3/uL (ref 4.0–10.5)
nRBC: 0 % (ref 0.0–0.2)

## 2021-03-28 LAB — I-STAT BETA HCG BLOOD, ED (MC, WL, AP ONLY): I-stat hCG, quantitative: <5 m[IU]/mL (ref <5)

## 2021-03-28 LAB — LIPASE, BLOOD: Lipase: 39 U/L (ref 11–51)

## 2021-03-28 NOTE — ED Triage Notes (Signed)
Pt arrived via POV, c/o diffuse abd pain radiating to back x4 days, also endorses some painful urination.

## 2021-03-28 NOTE — ED Notes (Signed)
Pt refused vitals 

## 2021-03-28 NOTE — ED Provider Triage Note (Signed)
Emergency Medicine Provider Triage Evaluation Note  Megan Fuller , a 36 y.o. female  was evaluated in triage.  Pt complains of lower abdominal pain.  Radiates into her back.  Intermittent over the last 4 days.  Also has some dysuria without hematuria.  Patient states she has had something previously and told she had cysts.  She is having normal bowel movements.  Last menstrual cycle was approximately 3 months ago however states due to PCOS she typically has irregular cycles.  No prior history of bowel obstructions.  No vaginal discharge or concern for STDs.  She does not think she is pregnant. No focal RLQ, LLQ pain.  Review of Systems  Positive: Lower abd pain Negative: Fever, vag discharge  Physical Exam  BP 125/83 (BP Location: Right Arm)    Pulse 68    Temp 98.6 F (37 C) (Oral)    Resp 16    Ht 5\' 4"  (1.626 m)    Wt 79.8 kg    SpO2 99%    BMI 30.21 kg/m  Gen:   Awake, no distress   Resp:  Normal effort  ABD:  Soft non tender MSK:   Moves extremities without difficulty  Other:    Medical Decision Making  Medically screening exam initiated at 4:31 PM.  Appropriate orders placed.  Donnis I Fuller was informed that the remainder of the evaluation will be completed by another provider, this initial triage assessment does not replace that evaluation, and the importance of remaining in the ED until their evaluation is complete.  Lower abd pain   Hakiem Malizia A, PA-C 03/28/21 1633

## 2021-06-21 ENCOUNTER — Ambulatory Visit: Payer: Self-pay | Admitting: Nurse Practitioner

## 2021-06-24 IMAGING — US US OB < 14 WEEKS - US OB TV
1 series · 15 of 28 positions shown · non-contrast
Comparison: None.

CLINICAL DATA: Lower abdominal and back pain

EXAM:
OBSTETRIC <14 WK US AND TRANSVAGINAL OB US
TECHNIQUE: Both transabdominal and transvaginal ultrasound examinations were
performed for complete evaluation of the gestation as well as the
maternal uterus, adnexal regions, and pelvic cul-de-sac.
Transvaginal technique was performed to assess early pregnancy.

[Series 1: us ob < 14 weeks - us ob tv · 15 of 61 slices shown]
[im 1/61]
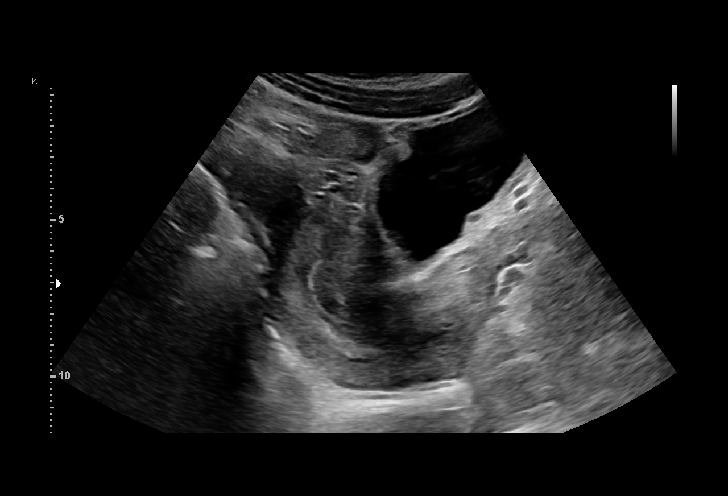
[im 5/61]
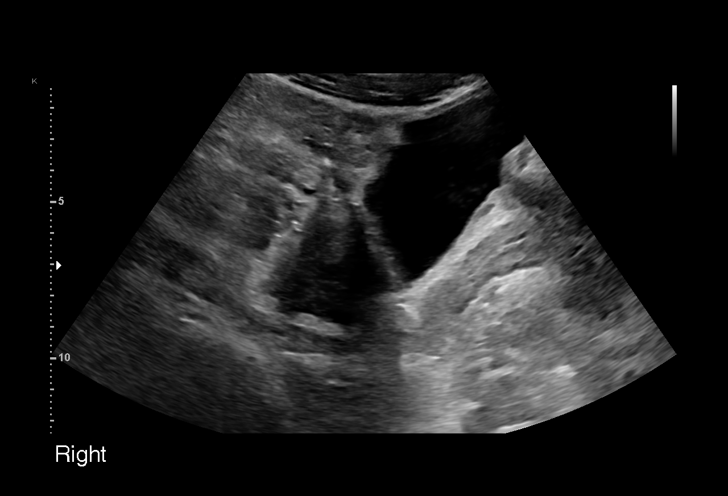
[im 9/61]
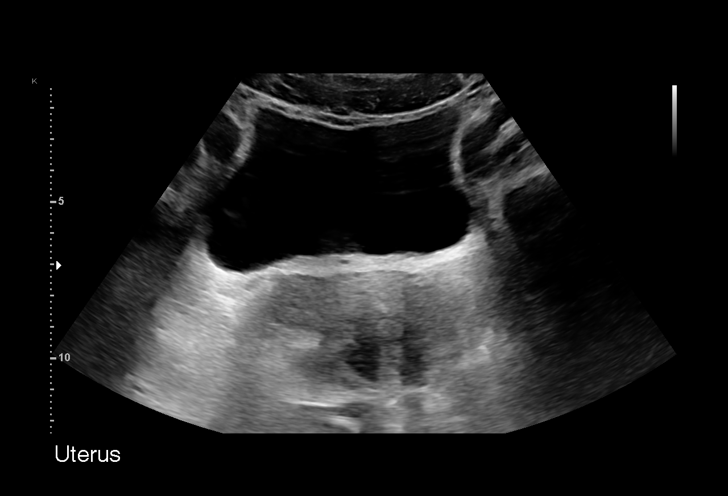
[im 14/61]
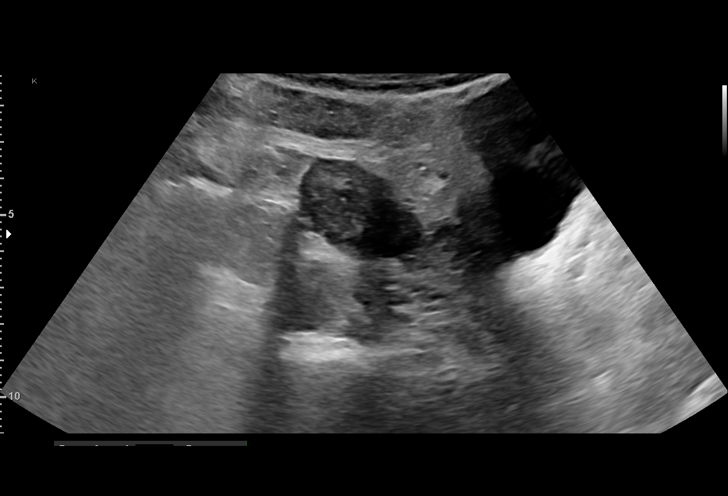
[im 18/61]
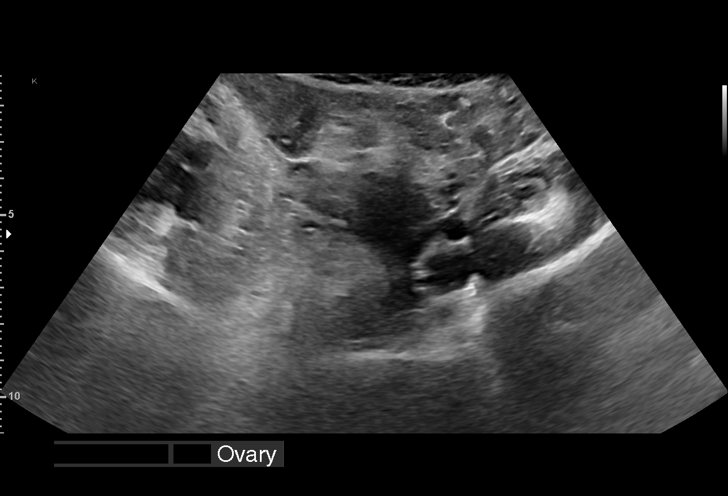
[im 23/61]
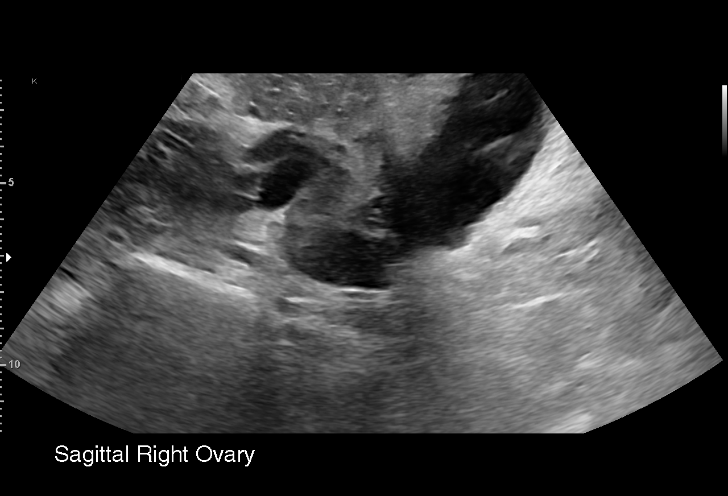
[im 27/61]
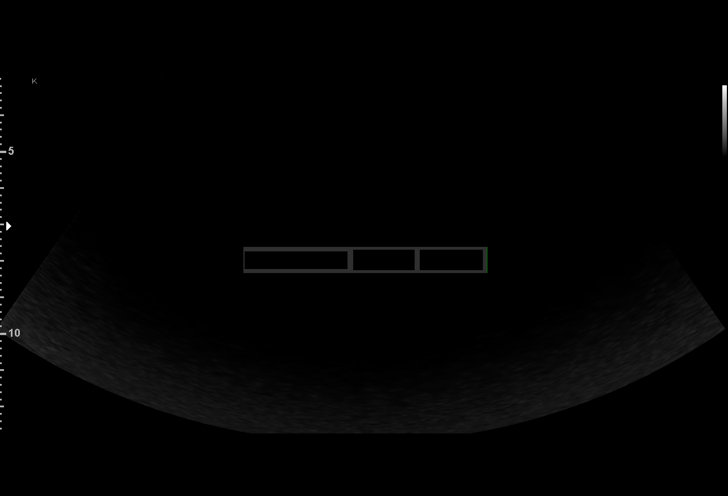
[im 32/61]
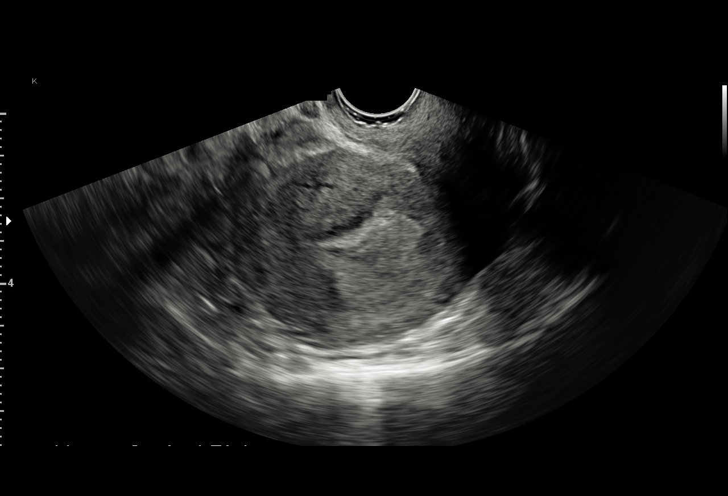
[im 34/61]
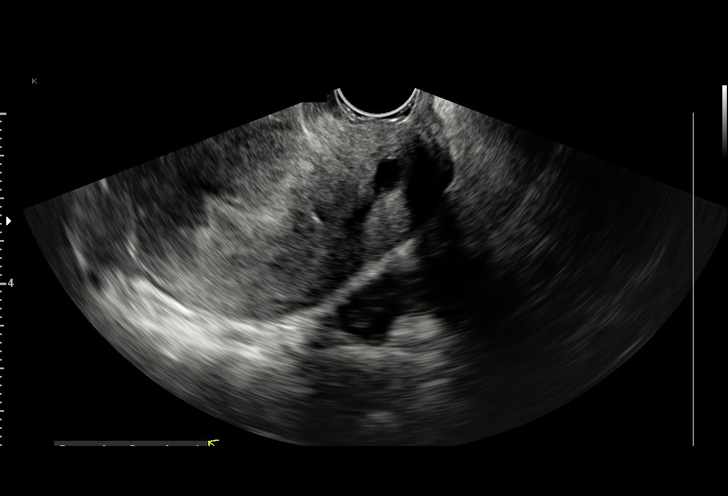
[im 38/61]
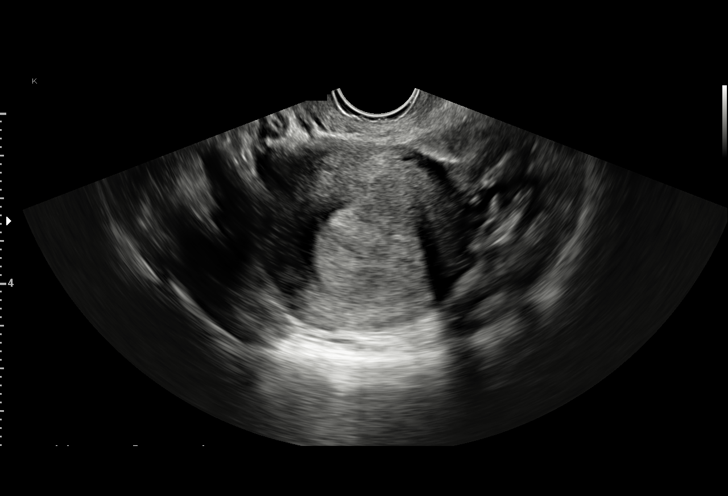
[im 43/61]
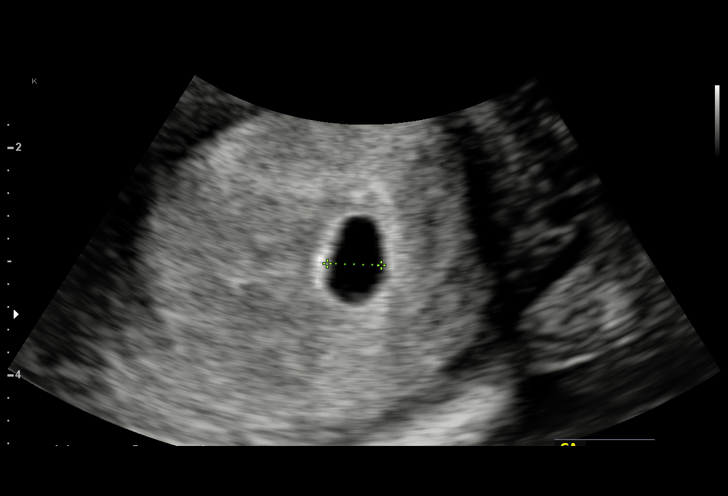
[im 47/61]
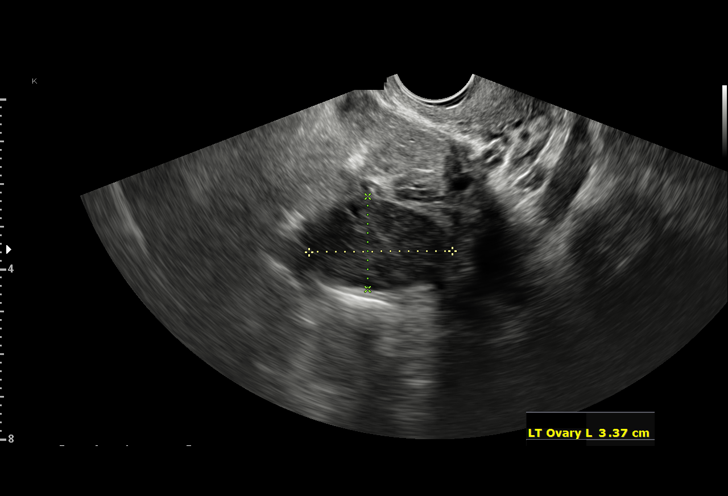
[im 52/61]
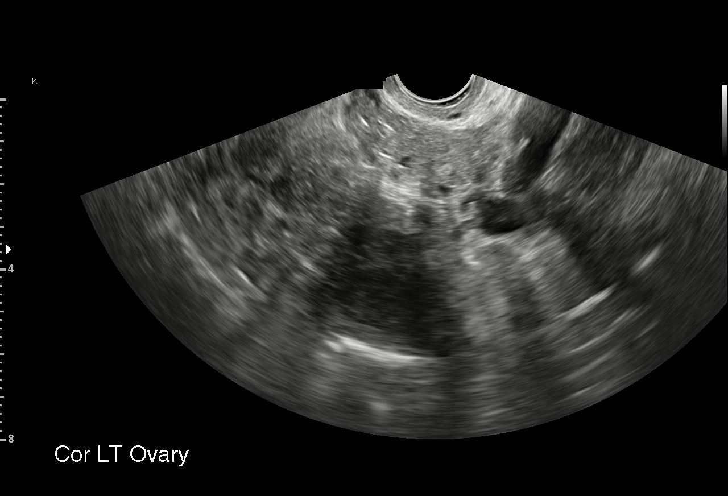
[im 56/61]
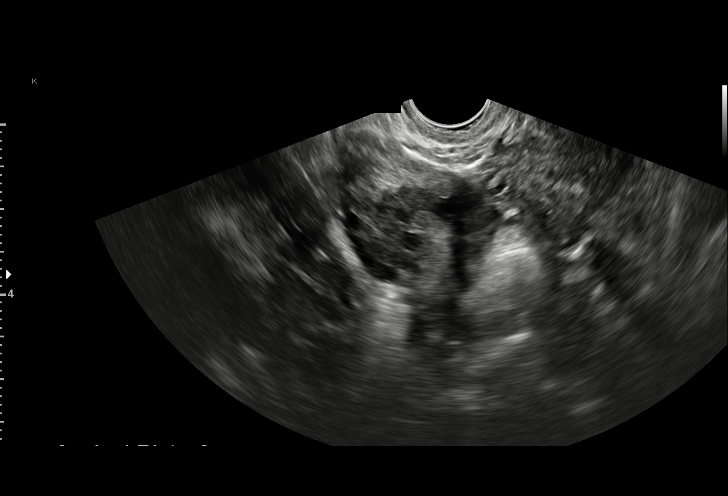
[im 61/61]
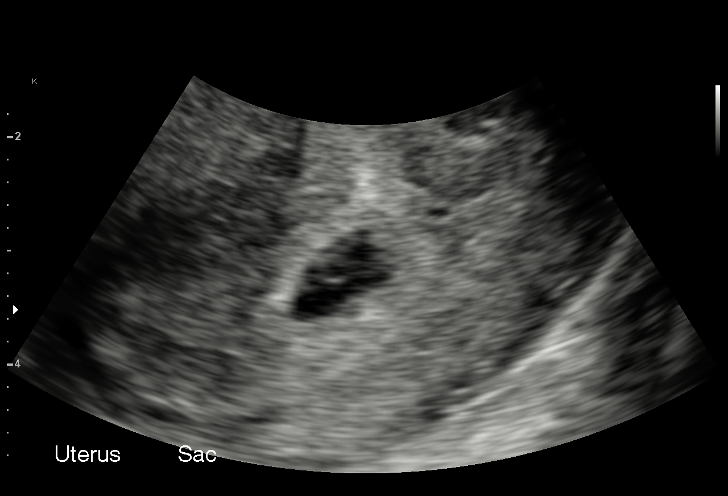

[15 of 28 positions shown; findings below may reference images not displayed]

FINDINGS: Intrauterine gestational sac: Single

Yolk sac:  Visualized

Embryo:  Not visualized

Cardiac Activity: Not visualized

Heart Rate:   bpm

MSD: 7.3 mm   5 w   3 d

CRL:    mm    w    d                  US EDC:

Subchorionic hemorrhage:  None visualized.

Maternal uterus/adnexae: No adnexal mass or free fluid.
IMPRESSION: Five week 3 day early intrauterine gestation with yolk sac present
but no fetal pole currently. This could be followed with repeat
ultrasound in 14 days to ensure expected progression. No acute
maternal findings.
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
## 2022-01-19 ENCOUNTER — Other Ambulatory Visit: Payer: Self-pay

## 2022-01-19 ENCOUNTER — Emergency Department (HOSPITAL_COMMUNITY): Payer: Self-pay

## 2022-01-19 ENCOUNTER — Emergency Department (HOSPITAL_COMMUNITY)
Admission: EM | Admit: 2022-01-19 | Discharge: 2022-01-19 | Disposition: A | Discharge status: Home / Self Care | Payer: Self-pay | Attending: Emergency Medicine | Admitting: Emergency Medicine

## 2022-01-19 ENCOUNTER — Encounter (HOSPITAL_COMMUNITY): Payer: Self-pay

## 2022-01-19 DIAGNOSIS — R7401 Elevation of levels of liver transaminase levels: Secondary | ICD-10-CM | POA: Insufficient documentation

## 2022-01-19 DIAGNOSIS — D72829 Elevated white blood cell count, unspecified: Secondary | ICD-10-CM | POA: Insufficient documentation

## 2022-01-19 DIAGNOSIS — N3001 Acute cystitis with hematuria: Secondary | ICD-10-CM | POA: Insufficient documentation

## 2022-01-19 DIAGNOSIS — R932 Abnormal findings on diagnostic imaging of liver and biliary tract: Secondary | ICD-10-CM | POA: Insufficient documentation

## 2022-01-19 LAB — URINALYSIS, ROUTINE W REFLEX MICROSCOPIC
Bilirubin Urine: NEGATIVE
Glucose, UA: NEGATIVE mg/dL
Ketones, ur: NEGATIVE mg/dL
Nitrite: NEGATIVE
Protein, ur: NEGATIVE mg/dL
Specific Gravity, Urine: 1.009 (ref 1.005–1.030)
pH: 6 (ref 5.0–8.0)

## 2022-01-19 LAB — HEPATIC FUNCTION PANEL
ALT: 28 U/L (ref 0–44)
AST: 44 U/L — ABNORMAL HIGH (ref 15–41)
Albumin: 2.9 g/dL — ABNORMAL LOW (ref 3.5–5.0)
Alkaline Phosphatase: 109 U/L (ref 38–126)
Bilirubin, Direct: 1.1 mg/dL — ABNORMAL HIGH (ref 0.0–0.2)
Indirect Bilirubin: 1.7 mg/dL — ABNORMAL HIGH (ref 0.3–0.9)
Total Bilirubin: 2.8 mg/dL — ABNORMAL HIGH (ref 0.3–1.2)
Total Protein: 6.5 g/dL (ref 6.5–8.1)

## 2022-01-19 LAB — CBC WITH DIFFERENTIAL/PLATELET
Abs Immature Granulocytes: 0.15 10*3/uL — ABNORMAL HIGH (ref 0.00–0.07)
Basophils Absolute: 0.1 10*3/uL (ref 0.0–0.1)
Basophils Relative: 0 %
Eosinophils Absolute: 0 10*3/uL (ref 0.0–0.5)
Eosinophils Relative: 0 %
HCT: 35.1 % — ABNORMAL LOW (ref 36.0–46.0)
Hemoglobin: 12.3 g/dL (ref 12.0–15.0)
Immature Granulocytes: 1 %
Lymphocytes Relative: 7 %
Lymphs Abs: 1 10*3/uL (ref 0.7–4.0)
MCH: 33.7 pg (ref 26.0–34.0)
MCHC: 35 g/dL (ref 30.0–36.0)
MCV: 96.2 fL (ref 80.0–100.0)
Monocytes Absolute: 1.3 10*3/uL — ABNORMAL HIGH (ref 0.1–1.0)
Monocytes Relative: 10 %
Neutro Abs: 11.2 10*3/uL — ABNORMAL HIGH (ref 1.7–7.7)
Neutrophils Relative %: 82 %
Platelets: 50 10*3/uL — ABNORMAL LOW (ref 150–400)
RBC: 3.65 MIL/uL — ABNORMAL LOW (ref 3.87–5.11)
RDW: 12 % (ref 11.5–15.5)
WBC: 13.7 10*3/uL — ABNORMAL HIGH (ref 4.0–10.5)
nRBC: 0 % (ref 0.0–0.2)

## 2022-01-19 LAB — WET PREP, GENITAL
Clue Cells Wet Prep HPF POC: NONE SEEN
Sperm: NONE SEEN
Trich, Wet Prep: NONE SEEN
WBC, Wet Prep HPF POC: <10 (ref <10)
Yeast Wet Prep HPF POC: NONE SEEN

## 2022-01-19 LAB — PREGNANCY, URINE: Preg Test, Ur: NEGATIVE

## 2022-01-19 LAB — BASIC METABOLIC PANEL
Anion gap: 6 (ref 5–15)
BUN: 12 mg/dL (ref 6–20)
CO2: 24 mmol/L (ref 22–32)
Calcium: 8.7 mg/dL — ABNORMAL LOW (ref 8.9–10.3)
Chloride: 105 mmol/L (ref 98–111)
Creatinine, Ser: 0.9 mg/dL (ref 0.44–1.00)
GFR, Estimated: >60 mL/min (ref >60)
Glucose, Bld: 115 mg/dL — ABNORMAL HIGH (ref 70–99)
Potassium: 4.3 mmol/L (ref 3.5–5.1)
Sodium: 135 mmol/L (ref 135–145)

## 2022-01-19 MED ORDER — FENTANYL CITRATE PF 50 MCG/ML IJ SOSY
50.0000 ug | PREFILLED_SYRINGE | Freq: Once | INTRAMUSCULAR | Status: AC
  Administered 2022-01-19: 50 ug via INTRAVENOUS
  Filled 2022-01-19: qty 1

## 2022-01-19 MED ORDER — KETOROLAC TROMETHAMINE 30 MG/ML IJ SOLN
15.0000 mg | Freq: Once | INTRAMUSCULAR | Status: AC
  Administered 2022-01-19: 15 mg via INTRAVENOUS
  Filled 2022-01-19: qty 1

## 2022-01-19 MED ORDER — CEPHALEXIN 500 MG PO CAPS: 500.0000 mg | ORAL_CAPSULE | Freq: Four times a day (QID) | ORAL | 0 refills | Status: DC

## 2022-01-19 MED ORDER — ACETAMINOPHEN 325 MG PO TABS
650.0000 mg | ORAL_TABLET | Freq: Four times a day (QID) | ORAL | Status: DC | PRN
  Administered 2022-01-19: 650 mg via ORAL
  Filled 2022-01-19: qty 2

## 2022-01-19 NOTE — ED Notes (Signed)
Attempted to DC patient but she states she is in too much pain to leave at this time. PA notified.

## 2022-01-19 NOTE — ED Triage Notes (Signed)
Patient reports that she was diagnosed with a vaginal infection weeks ago. Patient c/o abdominal pain and bilateral lower back pain. Patient states she is having generalized body aches, fever last night, dysuria, and vaginal discharge continues.

## 2022-01-19 NOTE — ED Provider Triage Note (Addendum)
Emergency Medicine Provider Triage Evaluation Note  Megan Fuller , a 36 y.o. female  was evaluated in triage.  Pt complains of a vaginal discharge and burningwith urination  Review of Systems  Positive: Discharge,  back pain and abdominal pain  Negative: Fever  Physical Exam  BP 130/89 (BP Location: Left Arm)   Pulse (!) 103   Temp 99.4 F (37.4 C) (Oral)   Resp 18   SpO2 97%  Gen:   Awake, no distress   Resp:  Normal effort  MSK:   Moves extremities without difficulty  Other:    Medical Decision Making  Medically screening exam initiated at 1:57 PM.  Appropriate orders placed.  Thursa I Fuller was informed that the remainder of the evaluation will be completed by another provider, this initial triage assessment does not replace that evaluation, and the importance of remaining in the ED until their evaluation is complete.     Elson Areas, PA-C 01/19/22 1359    Elson Areas, [place] 01/19/22 1402

## 2022-01-19 NOTE — ED Provider Notes (Cosign Needed Addendum)
[facility]-EMERGENCY DEPT Provider Note   CSN: [id number] Arrival date & time: 01/19/22  1319     History  Chief Complaint  Patient presents with   Abdominal Pain   Flank Pain    Megan Fuller is a 36 y.o. female.  The history is provided by the patient, the spouse and medical records. The history is limited by a language barrier. A language interpreter was used.  Abdominal Pain Flank Pain Associated symptoms include abdominal pain.     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  Also associated with vaginal discharge.  She is in a monogamous relationship with her husband, denies any history of untreated STDs.  She has been nauseated but not vomiting.  She also endorses having chills last night being slightly febrile.  Home Medications Prior to Admission medications   Medication Sig Start Date End Date Taking? Authorizing Provider  cephALEXin (KEFLEX) 500 MG capsule Take 1 capsule (500 mg total) by mouth 4 (four) times daily. 01/19/22  Yes Theron AristaSage, Jaythan Hinely, PA-C  amLODipine (NORVASC) 5 MG tablet Take 1 tablet (5 mg total) by mouth daily. Patient not taking: Reported on 02/03/2020 01/02/20   Hurshel PartyLeftwich-Kirby, Lisa A, CNM  drospirenone-ethinyl estradiol (YAZ) 3-0.02 MG tablet Take 1 tablet by mouth daily. 02/03/20   Malachy ChamberWilliams, Landon T, MD  ferrous sulfate 325 (65 FE) MG tablet Take 1 tablet (325 mg total) by mouth 3 (three) times daily with meals. 12/29/19   Adam PhenixArnold, James G, MD  ibuprofen (ADVIL) 600 MG tablet Take 1 tablet (600 mg total) by mouth every 6 (six) hours. Patient not taking: Reported on 02/03/2020 12/29/19   Adam PhenixArnold, James G, MD  metoCLOPramide (REGLAN) 5 MG tablet Take 1 tablet (5 mg total) by mouth 3 (three) times daily. 02/03/20 02/02/21  Malachy ChamberWilliams, Landon T, MD  oxyCODONE-acetaminophen (PERCOCET/ROXICET) 5-325 MG tablet Take 1-2  tablets by mouth every 6 (six) hours as needed for moderate pain or severe pain. Patient not taking: Reported on 02/03/2020 12/29/19   Adam PhenixArnold, James G, MD  Prenat MV-Min-Methylfolate-FA (PRENATE PO) Take by mouth.    [provider]  QUEtiapine (SEROQUEL) 25 MG tablet Take 1 tablet (25 mg total) by mouth at bedtime. Patient not taking: Reported on 02/03/2020 12/29/19   Adam PhenixArnold, James G, MD  sertraline (ZOLOFT) 25 MG tablet Take 1 tablet (25 mg total) by mouth daily. 02/03/20 02/02/21  Malachy ChamberWilliams, Landon T, MD  tranexamic acid (LYSTEDA) 650 MG TABS tablet Take 2 tablets (1,300 mg total) by mouth 3 (three) times daily. 03/08/20   Warden FillersBass, Lawrence A, MD      Allergies    Patient has no known allergies.    Review of Systems   Review of Systems  Gastrointestinal:  Positive for abdominal pain.  Genitourinary:  Positive for flank pain.    Physical Exam Updated Vital Signs BP 124/87 (BP Location: Right Arm)   Pulse 87   Temp 98.6 F (37 C) (Oral)   Resp 18   Ht 5\' 7"  (1.702 m)   Wt 81.6 kg   SpO2 100%   BMI 28.19 kg/m  Physical Exam Vitals and nursing note reviewed. Exam conducted with a chaperone present.  Constitutional:      Appearance: Normal appearance.  HENT:     Head: Normocephalic and atraumatic.  Eyes:     General: No scleral icterus.  Right eye: No discharge.        Left eye: No discharge.     Extraocular Movements: Extraocular movements intact.     Pupils: Pupils are equal, round, and reactive to light.  Cardiovascular:     Rate and Rhythm: Normal rate and regular rhythm.     Pulses: Normal pulses.     Heart sounds: Normal heart sounds. No murmur heard.    No friction rub. No gallop.  Pulmonary:     Effort: Pulmonary effort is normal. No respiratory distress.     Breath sounds: Normal breath sounds.  Abdominal:     General: Abdomen is flat. A surgical scar is present. Bowel sounds are normal. There is no distension.     Palpations: Abdomen is soft.      Tenderness: There is abdominal tenderness in the suprapubic area. There is no right CVA tenderness, left CVA tenderness, guarding or rebound.  Genitourinary:    Exam position: Lithotomy position.     Pubic Area: No rash.      Vagina: No foreign body. Vaginal discharge present. No erythema or bleeding.     Cervix: Normal.     Adnexa: Right adnexa normal and left adnexa normal.     Comments: No cervical wall motion tenderness.   Skin:    General: Skin is warm and dry.     Coloration: Skin is not jaundiced.  Neurological:     Mental Status: She is alert. Mental status is at baseline.     Coordination: Coordination normal.     ED Results / Procedures / Treatments   Labs (all labs ordered are listed, but only abnormal results are displayed) Labs Reviewed  URINALYSIS, ROUTINE W REFLEX MICROSCOPIC - Abnormal; Notable for the following components:      Result Value   Hgb urine dipstick MODERATE (*)    Leukocytes,Ua SMALL (*)    Bacteria, UA MANY (*)    All other components within normal limits  BASIC METABOLIC PANEL - Abnormal; Notable for the following components:   Glucose, Bld 115 (*)    Calcium 8.7 (*)    All other components within normal limits  CBC WITH DIFFERENTIAL/PLATELET - Abnormal; Notable for the following components:   WBC 13.7 (*)    RBC 3.65 (*)    HCT 35.1 (*)    Platelets 50 (*)    Neutro Abs 11.2 (*)    Monocytes Absolute 1.3 (*)    Abs Immature Granulocytes 0.15 (*)    All other components within normal limits  HEPATIC FUNCTION PANEL - Abnormal; Notable for the following components:   Albumin 2.9 (*)    AST 44 (*)    Total Bilirubin 2.8 (*)    Bilirubin, Direct 1.1 (*)    Indirect Bilirubin 1.7 (*)    All other components within normal limits  WET PREP, GENITAL  PREGNANCY, URINE  GC/CHLAMYDIA PROBE AMP ([place]) NOT AT [facility]    EKG None  Radiology CT Renal Stone Study  Result Date: 01/19/2022 CLINICAL DATA:  Flank pain, stone suspected EXAM:  CT ABDOMEN AND PELVIS WITHOUT CONTRAST TECHNIQUE: Multidetector CT imaging of the abdomen and pelvis was performed following the standard protocol without IV contrast. RADIATION DOSE REDUCTION: This exam was performed according to the departmental dose-optimization program which includes automated exposure control, adjustment of the mA and/or kV according to patient size and/or use of iterative reconstruction technique. COMPARISON:  None Available. FINDINGS: Lower chest: No acute abnormality. Hepatobiliary: No solid liver abnormality  is seen. Coarse, nodular contour of the liver. No gallstones, gallbladder wall thickening, or biliary dilatation. Pancreas: Unremarkable. No pancreatic ductal dilatation or surrounding inflammatory changes. Spleen: Splenomegaly, maximum coronal span 18.4 cm. Adrenals/Urinary Tract: Adrenal glands are unremarkable. Kidneys are normal, without renal calculi, solid lesion, or hydronephrosis. Bladder is unremarkable. Stomach/Bowel: Stomach is within normal limits. Appendix appears normal. No evidence of bowel wall thickening, distention, or inflammatory changes. Vascular/Lymphatic: No significant vascular findings are present. No enlarged abdominal or pelvic lymph nodes. Reproductive: No mass or other significant abnormality. Other: No abdominal wall hernia or abnormality. No ascites. Musculoskeletal: No acute or significant osseous findings. IMPRESSION: 1. No urinary tract calculi or hydronephrosis. 2. Cirrhosis and splenomegaly. Electronically Signed   By: Jearld Lesch M.D.   On: 01/19/2022 19:36    Procedures Procedures    Medications Ordered in ED Medications  acetaminophen (TYLENOL) tablet 650 mg (650 mg Oral Given 01/19/22 1405)  fentaNYL (SUBLIMAZE) injection 50 mcg (50 mcg Intravenous Given 01/19/22 1952)  ketorolac (TORADOL) 30 MG/ML injection 15 mg (15 mg Intravenous Given 01/19/22 2056)    ED Course/ Medical Decision Making/ A&P Clinical Course as of 01/19/22 2101   Sat Jan 19, 2022  2017 CT Renal Stone Study No nephrolithiasis or pyelonephritis.  Abnormal CT not there is cirrhosis and splenomegaly.  Will need to refer patient to gastroenterology as well as primary follow-up. [HS]  2017 Wet prep, genital Negative for yeast, trichomoniasis [HS]  2017 Urinalysis, Routine w reflex microscopic Urine, Clean Catch(!) Moderate hematuria and trace leukocyturia with WBC greater than 20.  Given dysuria and pain suprapubic tenderness consistent with UTI. [HS]  2018 Pregnancy, urine Not ectopic pregnancy [HS]  2030 Basic metabolic panel(!) No gross electrolyte derangement or AKI [HS]  2030 CBC with Differential(!) No anemia, slight leukocytosis with WBC 13.7 with neutrophilic predominance to 11.2. thrombocytopenia platelets 50. [HS]  2048 Hepatic function panel(!) Elevated AST at 44, ALT low.  Bili is elevated at 2.8 [HS]    Clinical Course User Index [HS] Theron Arista, PA-C                           Medical Decision Making Amount and/or Complexity of Data Reviewed Labs: ordered. Decision-making details documented in ED Course. Radiology: ordered. Decision-making details documented in ED Course.  Risk Prescription drug management.   This is a 36 year old presenting today due to flank pain. Related to UTI, plantar fasciitis, nephrolithiasis, septic stone, Inflammatory disease, STD.  I ordered, viewed and interpreted laboratory workup as documented ED course.  I ordered CT renal study which was negative for nephrolithiasis or pyelonephritis.  There was abnormal finding of cirrhosis as well as splenomegaly on the CT.  Given patient has thrombocytopenia, elevated AST and elevated bili this clinically seems to point towards alcoholic etiology but will need further workup with GI. Discussed cirrhohotic changes of liver on CT is abnormal and she will need to follow-up with gastroenterology, referral provided.  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  Denies any alcohol use since then.  I encouraged her to continue abstinence of alcohol.  I think patient's symptoms are most likely secondary to a UTI, will cover with Keflex.  There is no nephrolithiasis visualized on CT, additionally patient is not septic meets no SIRS criteria. Considered pelvic inflammatory disease but no cervical motion tenderness, no significant discharge and she  is especially monogamous relationship with minimal risk factors.    I considered admission but patient is stating she needs to go home and has a child at home.  And she is tolerating p.o., pain is improved I think is reasonable to try outpatient return if things change or worsen.  Vitals at time of discharge  BP 124/87 (BP Location: Right Arm)   Pulse 87   Temp 98.6 F (37 C) (Oral)   Resp 18   Ht 5\' 7"  (1.702 m)   Wt 81.6 kg   SpO2 100%   BMI 28.19 kg/m    Final Clinical Impression(s) / ED Diagnoses Final diagnoses:  Acute cystitis with hematuria  Abnormal CT of liver    Rx / DC Orders ED Discharge Orders          Ordered    cephALEXin (KEFLEX) 500 MG capsule  4 times daily        01/19/22 2034              2035, PA-C 01/19/22 2051    2052, PA-C 01/19/22 2124    2125, MD 01/20/22 [id number]

## 2022-01-19 NOTE — Discharge Instructions (Addendum)
You are seen today in the emergency department for urinary tract infection.  Take the antibiotic 4 times a day for the next 5 days.  Both the primary in a week for reevaluation.    As we discussed, you had an abnormal CT scan which showed cirrhosis of your liver.  You need to call and schedule an appointment with gastroenterology for follow-up, information as above.    Lo atendieron [employer] en el departamento de emergencias por una infeccin del tracto urinario. Tome el antibitico 4 veces al da [employer] prximos 5 [place]. Ambas primarias en una semana para reevaluacin.    Como comentamos, le hicieron una tomografa computarizada anormal que mostr cirrosis heptica. Debe llamar y programar una cita con gastroenterologa para seguimiento, informacin como la anterior.   Return to the ED if you have new or concerning symptoms.  CT ABDOMEN AND PELVIS WITHOUT CONTRAST    TECHNIQUE:  Multidetector CT imaging of the abdomen and pelvis was performed  following the standard protocol without IV contrast.    RADIATION DOSE REDUCTION: This exam was performed according to the  departmental dose-optimization program which includes automated  exposure control, adjustment of the mA and/or kV according to  patient size and/or use of iterative reconstruction technique.    COMPARISON:  None Available.    FINDINGS:  Lower chest: No acute abnormality.    Hepatobiliary: No solid liver abnormality is seen. Coarse, nodular  contour of the liver. No gallstones, gallbladder wall thickening, or  biliary dilatation.    Pancreas: Unremarkable. No pancreatic ductal dilatation or  surrounding inflammatory changes.    Spleen: Splenomegaly, maximum coronal span 18.4 cm.    Adrenals/Urinary Tract: Adrenal glands are unremarkable. Kidneys are  normal, without renal calculi, solid lesion, or hydronephrosis.  Bladder is unremarkable.    Stomach/Bowel: Stomach is within normal limits. Appendix appears   normal. No evidence of bowel wall thickening, distention, or  inflammatory changes.    Vascular/Lymphatic: No significant vascular findings are present. No  enlarged abdominal or pelvic lymph nodes.    Reproductive: No mass or other significant abnormality.    Other: No abdominal wall hernia or abnormality. No ascites.    Musculoskeletal: No acute or significant osseous findings.    IMPRESSION:  1. No urinary tract calculi or hydronephrosis.  2. Cirrhosis and splenomegaly.

## 2022-01-21 LAB — GC/CHLAMYDIA PROBE AMP (~~LOC~~) NOT AT ARMC
Chlamydia: NEGATIVE
Comment: NEGATIVE
Comment: NORMAL
Neisseria Gonorrhea: NEGATIVE

## 2022-05-16 ENCOUNTER — Telehealth: Payer: Self-pay

## 2022-05-16 NOTE — Telephone Encounter (Signed)
Attempted to reach pt via phone w/ interpreter services regarding message left by Elbia AAM on offcie phone. Pt phone [phone] is disconnected

## 2022-05-28 ENCOUNTER — Encounter: Payer: Self-pay | Admitting: Obstetrics & Gynecology

## 2022-05-28 ENCOUNTER — Other Ambulatory Visit (HOSPITAL_COMMUNITY)
Admission: RE | Admit: 2022-05-28 | Discharge: 2022-05-28 | Disposition: A | Discharge status: Home / Self Care | Payer: Self-pay | Source: Ambulatory Visit | Attending: Obstetrics & Gynecology | Admitting: Obstetrics & Gynecology

## 2022-05-28 ENCOUNTER — Ambulatory Visit (INDEPENDENT_AMBULATORY_CARE_PROVIDER_SITE_OTHER): Payer: Self-pay | Admitting: Obstetrics & Gynecology

## 2022-05-28 VITALS — BP 120/71 | HR 92 | Ht 65.0 in | Wt 192.0 lb

## 2022-05-28 DIAGNOSIS — R7989 Other specified abnormal findings of blood chemistry: Secondary | ICD-10-CM

## 2022-05-28 DIAGNOSIS — Z98891 History of uterine scar from previous surgery: Secondary | ICD-10-CM

## 2022-05-28 DIAGNOSIS — O2342 Unspecified infection of urinary tract in pregnancy, second trimester: Secondary | ICD-10-CM

## 2022-05-28 DIAGNOSIS — O162 Unspecified maternal hypertension, second trimester: Secondary | ICD-10-CM

## 2022-05-28 DIAGNOSIS — O0990 Supervision of high risk pregnancy, unspecified, unspecified trimester: Secondary | ICD-10-CM | POA: Insufficient documentation

## 2022-05-28 DIAGNOSIS — Z3A16 16 weeks gestation of pregnancy: Secondary | ICD-10-CM

## 2022-05-28 DIAGNOSIS — O99212 Obesity complicating pregnancy, second trimester: Secondary | ICD-10-CM

## 2022-05-28 DIAGNOSIS — D693 Immune thrombocytopenic purpura: Secondary | ICD-10-CM

## 2022-05-28 DIAGNOSIS — O34211 Maternal care for low transverse scar from previous cesarean delivery: Secondary | ICD-10-CM

## 2022-05-28 DIAGNOSIS — O99210 Obesity complicating pregnancy, unspecified trimester: Secondary | ICD-10-CM | POA: Insufficient documentation

## 2022-05-28 DIAGNOSIS — O169 Unspecified maternal hypertension, unspecified trimester: Secondary | ICD-10-CM

## 2022-05-28 MED ORDER — ASPIRIN 81 MG PO TBEC: 81.0000 mg | DELAYED_RELEASE_TABLET | Freq: Every day | ORAL | 2 refills | Status: DC

## 2022-05-28 NOTE — Progress Notes (Signed)
History:   Megan Fuller is a 37 y.o. G2P0101 at [redacted]w[redacted]d by unsure LMP being seen today for her first obstetrical visit. Patient is Spanish-speaking only, interpreter present for this encounter.  She is part of the [employer] program. Her obstetrical history is significant for  urgent preterm LTCS at 25 weeks via vertical skin incision in the setting of PPROM/PTL and suspected abruption.  Vertical skin incision done to use a more avascular plane and decrease overall blood loss given her platelet count of 59K . She has known chronic ITP, no recent hematology follow up, does not recall the last time anyone saw her, just says "they told me there is nothing they can do for me". Also has been followed for increased LFTs noted to be in 51s and 81s for many years, 12/2021 CT scan at [facility] ER showed possible cirrhosis. She denied any history of alcoholism or hepatitis, had negative Hepatitis panel at [facility] in 2018, but remembers that she had a liver abnormality as a child and her parents were told she needed a biopsy but they did not get one. Unsure if she had any parasitic infection or other infection. She has not been seen by a GI doctor as recommended on several ER visits. Pregnancy history fully reviewed.  Patient reports  some pelvic pain that occurs in her hips and vagina sometimes.  Also reports having some heart palpitations which she is worried about .      HISTORY: OB History  Gravida Para Term Preterm AB Living  2 1 0 1 0 1  SAB IAB Ectopic Multiple Live Births  0 0 0 0 1    # Outcome Date GA Lbr Len/2nd Weight Sex Delivery Anes PTL Lv  2 Current           1 Preterm 12/26/19 [redacted]w[redacted]d  1 lb 7.3 oz (0.66 kg) M CS-LVertical Gen  LIV     Name: PINEDA-LOVO,BOY Marylee     Apgar1: 4  Apgar5: 6  Last pap smear was done 11/25/2019 and was normal  Past Medical History:  Diagnosis Date   Cirrhosis of liver    Noted on 12/2021 CT scan. LFTs reassuring. Negative Hepatitis evaluation at [facility] in 2018.  Patient had liver issues as a child, biopsy recommended but she her family did not want to do this. No history of alcoholism.   PCOS (polycystic ovarian syndrome)    Past Surgical History:  Procedure Laterality Date   CESAREAN SECTION N/A 12/26/2019   Procedure: CESAREAN SECTION;  Surgeon: Aletha Halim, MD;  Location: [facility] LD ORS;  Service: Obstetrics;  Laterality: N/A;   Family History  Problem Relation Age of Onset   Diabetes Mother    Social History   Tobacco Use   Smoking status: Never   Smokeless tobacco: Never  Vaping Use   Vaping Use: Never used  Substance Use Topics   Alcohol use: No    Alcohol/week: 0.0 standard drinks of alcohol   Drug use: No   No Known Allergies Current Outpatient Medications on File Prior to Visit  Medication Sig Dispense Refill   sertraline (ZOLOFT) 25 MG tablet Take 1 tablet (25 mg total) by mouth daily. 30 tablet 2   No current facility-administered medications on file prior to visit.    Review of Systems Pertinent items noted in HPI and remainder of comprehensive ROS otherwise negative.   Physical Exam:   Vitals:   05/28/22 1010 05/28/22 1020 05/28/22 1128  BP:  (!) 142/89 120/71  Pulse:  96 92  Weight:  192 lb (87.1 kg)   Height: 5\' 5"  (1.651 m)     Fetal Heart Rate (bpm): 147   General: well-developed, well-nourished female in no acute distress  Breasts:  deferred  Skin: normal coloration and turgor, no rashes  Neurologic: oriented, normal, negative, normal mood  Extremities: normal strength, tone, and muscle mass, ROM of all joints is normal  HEENT PERRLA, extraocular movement intact and sclera clear, anicteric  Neck supple and no masses  Cardiovascular: regular rate and rhythm  Respiratory:  no respiratory distress, normal breath sounds  Abdomen: soft, non-tender; bowel sounds normal; no masses,  no organomegaly  Pelvic: deferred    Assessment:    Pregnancy: G2P0101 Patient Active Problem List   Diagnosis Date Noted    Elevated LFTs/?cirrhosis 05/29/2022   Supervision of high risk pregnancy, antepartum 05/28/2022   Obesity in pregnancy, antepartum 05/28/2022   History of preterm delivery, currently pregnant 01/01/2020   History of cesarean section, low transverse 01/01/2020   Chronic ITP (idiopathic thrombocytopenia) 10/27/2019   Depression affecting pregnancy in second trimester, antepartum 10/27/2019   PCOS (polycystic ovarian syndrome) 02/10/2014     Plan:    1. Chronic ITP (idiopathic thrombocytopenia) Emphasized need to be seen by Hematology, understand that cost is an issue. Was given application for [facility] financial help. Concerned about potential blood loss during delivery. - Ambulatory referral to Hematology / Oncology  2. Elevated blood pressure affecting pregnancy, antepartum Normal recheck, but had elevated BP during 12/2021 ER visit. Aspirin prescribed. Labs checked. Given her cardiac symptoms, referred to [place].  - aspirin EC 81 MG tablet; Take 1 tablet (81 mg total) by mouth daily. Start taking when you are [redacted] weeks pregnant for rest of pregnancy for prevention of preeclampsia  Dispense: 300 tablet; Refill: 2 - Comprehensive metabolic panel - Protein / creatinine ratio, urine - AMB Referral to Cardio Obstetrics - US OB Detail; Future  3. Elevated LFTs, cirrhosis seen on CT scan -  Will follow up ordered CMET for current LFTs - Encouraged referral to GI for further evaluation given her reported history, patient declined this due to cost.  This may need to be done depending on the results of the CMET. Encouraged again to apply for [facility] financial assistance.   4. History of emergent low transverse cesarean section due to PPROM/PTL/placental abruption at 25 weeks Patient desires RCS and BTS.   This choice is complicated due to her known ITP.  5. Obesity in pregnancy, antepartum TWG 11-20 lbs recommended. Will check labs.  - Hemoglobin A1c - TSH Rfx on Abnormal to Free T4 -  AMB Referral to Cardio Obstetrics - US OB Detail; Future  6. [redacted] weeks gestation of pregnancy 7. Supervision of high risk pregnancy, antepartum - CBC/D/Plt+RPR+Rh+ABO+RubIgG... - Culture, OB Urine - Urine cytology ancillary only - AMB Referral to Cardio Obstetrics - US OB Detail; Future Initial labs drawn. Continue prenatal vitamins. Problem list reviewed and updated. Genetic Screening discussed, Panor[facility] and Horizon: declined. Ultrasound discussed; fetal anatomic survey: scheduled at [place]. Anticipatory guidance about prenatal visits given including labs, ultrasounds, and testing. The nature of [facility]ulty Practice with multiple MDs and Advanced Practice Providers was explained to patient; also emphasized that residents, students are part of our team. Routine obstetric precautions reviewed. Encouraged to seek out care at our office or emergency room [facility] preferred) for urgent and/or emergent concerns. Return in about 4 weeks (around 06/25/2022) for OFFICE OB VISIT (MD  or APP).     Verita Schneiders, MD, [place] for [employer], [place]

## 2022-05-29 DIAGNOSIS — K7460 Unspecified cirrhosis of liver: Secondary | ICD-10-CM | POA: Insufficient documentation

## 2022-05-29 DIAGNOSIS — R7989 Other specified abnormal findings of blood chemistry: Secondary | ICD-10-CM | POA: Insufficient documentation

## 2022-05-29 LAB — URINE CYTOLOGY ANCILLARY ONLY
Chlamydia: NEGATIVE
Comment: NEGATIVE
Comment: NORMAL
Neisseria Gonorrhea: NEGATIVE

## 2022-05-30 ENCOUNTER — Encounter: Payer: Self-pay | Admitting: Family Medicine

## 2022-05-30 ENCOUNTER — Encounter: Payer: Self-pay | Admitting: Obstetrics & Gynecology

## 2022-05-30 DIAGNOSIS — O1212 Gestational proteinuria, second trimester: Secondary | ICD-10-CM | POA: Insufficient documentation

## 2022-05-30 DIAGNOSIS — R161 Splenomegaly, not elsewhere classified: Secondary | ICD-10-CM | POA: Insufficient documentation

## 2022-05-30 LAB — CBC/D/PLT+RPR+RH+ABO+RUBIGG...
Antibody Screen: NEGATIVE
Basophils Absolute: 0 10*3/uL (ref 0.0–0.2)
Basos: 1 %
EOS (ABSOLUTE): 0.1 10*3/uL (ref 0.0–0.4)
Eos: 1 %
HCV Ab: NONREACTIVE
HIV Screen 4th Generation wRfx: NONREACTIVE
Hematocrit: 32.2 % — ABNORMAL LOW (ref 34.0–46.6)
Hemoglobin: 11 g/dL — ABNORMAL LOW (ref 11.1–15.9)
Hepatitis B Surface Ag: NEGATIVE
Immature Grans (Abs): 0 10*3/uL (ref 0.0–0.1)
Immature Granulocytes: 1 %
Lymphocytes Absolute: 0.8 10*3/uL (ref 0.7–3.1)
Lymphs: 18 %
MCH: 33.6 pg — ABNORMAL HIGH (ref 26.6–33.0)
MCHC: 34.2 g/dL (ref 31.5–35.7)
MCV: 99 fL — ABNORMAL HIGH (ref 79–97)
Monocytes Absolute: 0.3 10*3/uL (ref 0.1–0.9)
Monocytes: 8 %
Neutrophils Absolute: 3.1 10*3/uL (ref 1.4–7.0)
Neutrophils: 71 %
Platelets: 68 10*3/uL — CL (ref 150–450)
RBC: 3.27 x10E6/uL — ABNORMAL LOW (ref 3.77–5.28)
RDW: 14.4 % (ref 11.7–15.4)
RPR Ser Ql: NONREACTIVE
Rh Factor: POSITIVE
Rubella Antibodies, IGG: 11 {index}
WBC: 4.3 10*3/uL (ref 3.4–10.8)

## 2022-05-30 LAB — COMPREHENSIVE METABOLIC PANEL
ALT: 19 IU/L (ref 0–32)
AST: 46 IU/L — ABNORMAL HIGH (ref 0–40)
Albumin/Globulin Ratio: 1.3 (ref 1.2–2.2)
Albumin: 3 g/dL — ABNORMAL LOW (ref 3.9–4.9)
Alkaline Phosphatase: 100 IU/L (ref 44–121)
BUN/Creatinine Ratio: 7 — ABNORMAL LOW (ref 9–23)
BUN: 4 mg/dL — ABNORMAL LOW (ref 6–20)
Bilirubin Total: 1.5 mg/dL — ABNORMAL HIGH (ref 0.0–1.2)
CO2: 19 mmol/L — ABNORMAL LOW (ref 20–29)
Calcium: 8.3 mg/dL — ABNORMAL LOW (ref 8.7–10.2)
Chloride: 106 mmol/L (ref 96–106)
Creatinine, Ser: 0.54 mg/dL — ABNORMAL LOW (ref 0.57–1.00)
Globulin, Total: 2.4 g/dL (ref 1.5–4.5)
Glucose: 64 mg/dL — ABNORMAL LOW (ref 70–99)
Potassium: 3.7 mmol/L (ref 3.5–5.2)
Sodium: 138 mmol/L (ref 134–144)
Total Protein: 5.4 g/dL — ABNORMAL LOW (ref 6.0–8.5)
eGFR: 124 mL/min/{1.73_m2} (ref >59)

## 2022-05-30 LAB — TSH RFX ON ABNORMAL TO FREE T4: TSH: 2.37 u[IU]/mL (ref 0.450–4.500)

## 2022-05-30 LAB — HEMOGLOBIN A1C
Est. average glucose Bld gHb Est-mCnc: 82 mg/dL
Hgb A1c MFr Bld: 4.5 % — ABNORMAL LOW (ref 4.8–5.6)

## 2022-05-30 LAB — PROTEIN / CREATININE RATIO, URINE
Creatinine, Urine: 191.3 mg/dL
Protein, Ur: 47.5 mg/dL
Protein/Creat Ratio: 248 mg/g creat — ABNORMAL HIGH (ref 0–200)

## 2022-05-30 LAB — HCV INTERPRETATION

## 2022-06-02 LAB — URINE CULTURE, OB REFLEX

## 2022-06-02 LAB — CULTURE, OB URINE

## 2022-06-03 ENCOUNTER — Encounter: Payer: Self-pay | Admitting: Obstetrics & Gynecology

## 2022-06-03 MED ORDER — CEFADROXIL 500 MG PO CAPS: 500.0000 mg | ORAL_CAPSULE | Freq: Two times a day (BID) | ORAL | 0 refills | Status: DC

## 2022-06-03 NOTE — Addendum Note (Signed)
Addended by: Jaynie Collins A on: 06/03/2022 01:40 PM   Modules accepted: Orders

## 2022-06-05 ENCOUNTER — Telehealth: Payer: Self-pay

## 2022-06-05 NOTE — Telephone Encounter (Addendum)
-----   Message from Tereso Newcomer, MD sent at 06/03/2022  1:39 PM EDT ----- E.coli UTI noted. Cefadroxil prescribed. Please call to inform patient of results and advise her to pick up prescription and take as directed. Spanish speaking.  Called pt with Spanish Interpreter Eda R., and notified pt results and antibiotic tx sent to [facility] pharmacy that she will take twice a day from seven days.    Leonette Nutting

## 2022-06-06 ENCOUNTER — Telehealth: Payer: Self-pay

## 2022-06-06 NOTE — Telephone Encounter (Signed)
Called pt via interpreter services to notify of u/s appt on 4/12 at 11:30 am

## 2022-06-08 ENCOUNTER — Inpatient Hospital Stay: Payer: Self-pay | Attending: Hematology and Oncology | Admitting: Hematology and Oncology

## 2022-06-08 ENCOUNTER — Inpatient Hospital Stay: Payer: Self-pay

## 2022-06-08 VITALS — BP 134/92 | HR 92 | Temp 98.1°F | Resp 16 | Ht 65.0 in | Wt 194.2 lb

## 2022-06-08 DIAGNOSIS — Z79899 Other long term (current) drug therapy: Secondary | ICD-10-CM | POA: Insufficient documentation

## 2022-06-08 DIAGNOSIS — R7989 Other specified abnormal findings of blood chemistry: Secondary | ICD-10-CM

## 2022-06-08 DIAGNOSIS — Z7982 Long term (current) use of aspirin: Secondary | ICD-10-CM | POA: Diagnosis not present

## 2022-06-08 DIAGNOSIS — O26612 Liver and biliary tract disorders in pregnancy, second trimester: Secondary | ICD-10-CM | POA: Insufficient documentation

## 2022-06-08 DIAGNOSIS — D693 Immune thrombocytopenic purpura: Secondary | ICD-10-CM

## 2022-06-08 DIAGNOSIS — K7460 Unspecified cirrhosis of liver: Secondary | ICD-10-CM | POA: Insufficient documentation

## 2022-06-08 DIAGNOSIS — O99112 Other diseases of the blood and blood-forming organs and certain disorders involving the immune mechanism complicating pregnancy, second trimester: Secondary | ICD-10-CM | POA: Diagnosis present

## 2022-06-08 DIAGNOSIS — Z3A22 22 weeks gestation of pregnancy: Secondary | ICD-10-CM | POA: Diagnosis not present

## 2022-06-08 LAB — CMP (CANCER CENTER ONLY)
ALT: 16 U/L (ref 0–44)
AST: 34 U/L (ref 15–41)
Albumin: 2.9 g/dL — ABNORMAL LOW (ref 3.5–5.0)
Alkaline Phosphatase: 81 U/L (ref 38–126)
Anion gap: 6 (ref 5–15)
BUN: 6 mg/dL (ref 6–20)
CO2: 24 mmol/L (ref 22–32)
Calcium: 8.8 mg/dL — ABNORMAL LOW (ref 8.9–10.3)
Chloride: 109 mmol/L (ref 98–111)
Creatinine: 0.6 mg/dL (ref 0.44–1.00)
GFR, Estimated: >60 mL/min (ref >60)
Glucose, Bld: 76 mg/dL (ref 70–99)
Potassium: 3.8 mmol/L (ref 3.5–5.1)
Sodium: 139 mmol/L (ref 135–145)
Total Bilirubin: 2 mg/dL — ABNORMAL HIGH (ref 0.3–1.2)
Total Protein: 6.1 g/dL — ABNORMAL LOW (ref 6.5–8.1)

## 2022-06-08 LAB — IRON AND IRON BINDING CAPACITY (CC-WL,HP ONLY)
Iron: 222 ug/dL — ABNORMAL HIGH (ref 28–170)
Saturation Ratios: 80 % — ABNORMAL HIGH (ref 10.4–31.8)
TIBC: 277 ug/dL (ref 250–450)
UIBC: 55 ug/dL — ABNORMAL LOW (ref 148–442)

## 2022-06-08 LAB — CBC WITH DIFFERENTIAL/PLATELET
Abs Immature Granulocytes: 0.02 10*3/uL (ref 0.00–0.07)
Basophils Absolute: 0 10*3/uL (ref 0.0–0.1)
Basophils Relative: 1 %
Eosinophils Absolute: 0.1 10*3/uL (ref 0.0–0.5)
Eosinophils Relative: 2 %
HCT: 31.8 % — ABNORMAL LOW (ref 36.0–46.0)
Hemoglobin: 11.3 g/dL — ABNORMAL LOW (ref 12.0–15.0)
Immature Granulocytes: 0 %
Lymphocytes Relative: 17 %
Lymphs Abs: 1 10*3/uL (ref 0.7–4.0)
MCH: 34.6 pg — ABNORMAL HIGH (ref 26.0–34.0)
MCHC: 35.5 g/dL (ref 30.0–36.0)
MCV: 97.2 fL (ref 80.0–100.0)
Monocytes Absolute: 0.4 10*3/uL (ref 0.1–1.0)
Monocytes Relative: 7 %
Neutro Abs: 4.4 10*3/uL (ref 1.7–7.7)
Neutrophils Relative %: 73 %
Platelets: 76 10*3/uL — ABNORMAL LOW (ref 150–400)
RBC: 3.27 MIL/uL — ABNORMAL LOW (ref 3.87–5.11)
RDW: 14.5 % (ref 11.5–15.5)
WBC: 6 10*3/uL (ref 4.0–10.5)
nRBC: 0 % (ref 0.0–0.2)

## 2022-06-08 LAB — FERRITIN: Ferritin: 72 ng/mL (ref 11–307)

## 2022-06-08 LAB — VITAMIN B12: Vitamin B-12: 817 pg/mL (ref 180–914)

## 2022-06-08 NOTE — Progress Notes (Signed)
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  Patient arrived to the appointment with certified Spanish interpreter.  She had a child back in November 2021, had him at 25 weeks of gestation because of premature rupture of membranes, underwent C-section and general anesthesia.  She otherwise cannot remember any history of needing platelets or blood transfusion prior to that.  She cannot remember how long ago she has been diagnosed with chronic ITP.  There appears to be some questionable history of liver cirrhosis, nonalcohol related.  Physical examination today healthy appearing female patient, no major abnormalities.  I have reviewed her labs for the past several years platelet count averages around 60-65,000 which has not significantly changed.  At this time we have discussed that she cannot get any epidural anesthesia given platelet count under 90,000.  If she does need another C-section, it is best to proceed under general anesthesia.  In case of natural labor, once again epidural is not recommended.    In case of active bleeding, she can certainly get platelet transfusions however this is not recommended at this time since she is doing well and her platelets are stable.  I have ordered some labs including CBC, iron panel, B12, folic acid, coagulopathy labs.  I have recommended that she come back in about 6 weeks when she will enter third trimester. It is not entirely clear if she has chronic ITP or if there is a component of thrombocytopenia secondary to liver disease but overall it has remained stable.  She expressed understanding of recommendations.  Thank you for consulting [place] in the care of this  patient.  Please not hesitate to contact us with any additional questions or concerns.  HISTORY OF PRESENTING ILLNESS:  Megan Fuller 37 y.o. female is here because of chronic ITP and pregnancy.  Patient arrived almost 15 min late to the appointment. Certified spanish interpretor present for the entirety of this conversation.  This is a 37 year old female patient with 22 weeks of gestation, chronic ITP, questionable liver cirrhosis from [place] referred to hematology for recommendations given her thrombocytopenia in pregnancy.  She delivered her last child back in November 2021.  Baby is healthy however according to her needed some transfusions.  Prior to that, she does not remember any history of needing platelet transfusion or bleeding issues.  She has had chronic ITP for several years.  There is also some history in the chart about possible liver cirrhosis childhood hepatic issues however this does not appear to be confirmed. Her pregnancy is going well at this time.  She has been taking her prenatal vitamins.  For her last pregnancy she had premature rupture of membranes and delivered baby at 25 weeks under general anesthesia, epidural not given given thrombocytopenia.  She denies any complaints today for me.  She is feeling well.  Rest of the pertinent 10 point ROS reviewed and negative  MEDICAL HISTORY:  Past Medical History:  Diagnosis Date   Cirrhosis of liver    Noted on 12/2021 CT scan. LFTs reassuring. Negative Hepatitis evaluation at [facility] in 2018. Patient had liver issues as a child, biopsy recommended but she her family did not want to do this. No history of alcoholism.  PCOS (polycystic ovarian syndrome)     SURGICAL HISTORY: Past Surgical History:  Procedure Laterality Date   CESAREAN SECTION N/A 12/26/2019   Procedure: CESAREAN SECTION;  Surgeon: [place] Bing, MD;  Location: [facility] LD ORS;  Service: Obstetrics;  Laterality: N/A;    SOCIAL HISTORY: Social History    Socioeconomic History   Marital status: Single    Spouse name: Not on file   Number of children: Not on file   Years of education: Not on file   Highest education level: Not on file  Occupational History   Not on file  Tobacco Use   Smoking status: Never   Smokeless tobacco: Never  Vaping Use   Vaping Use: Never used  Substance and Sexual Activity   Alcohol use: No    Alcohol/week: 0.0 standard drinks of alcohol   Drug use: No   Sexual activity: Yes  Other Topics Concern   Not on file  Social History Narrative   Not on file   Social Determinants of Health   Financial Resource Strain: Not on file  Food Insecurity: Food Insecurity Present (03/01/2020)   Hunger Vital Sign    Worried About Running Out of Food in the Last Year: Sometimes true    Ran Out of Food in the Last Year: Sometimes true  Transportation Needs: No Transportation Needs (03/01/2020)   PRAPARE - Administrator, Civil Service (Medical): No    Lack of Transportation (Non-Medical): No  Physical Activity: Not on file  Stress: Not on file  Social Connections: Not on file  Intimate Partner Violence: Not on file    FAMILY HISTORY: Family History  Problem Relation Age of Onset   Diabetes Mother     ALLERGIES:  has No Known Allergies.  MEDICATIONS:  Current Outpatient Medications  Medication Sig Dispense Refill   aspirin EC 81 MG tablet Take 1 tablet (81 mg total) by mouth daily. Start taking when you are [redacted] weeks pregnant for rest of pregnancy for prevention of preeclampsia 300 tablet 2   cefadroxil (DURICEF) 500 MG capsule Take 1 capsule (500 mg total) by mouth 2 (two) times daily. 14 capsule 0   sertraline (ZOLOFT) 25 MG tablet Take 1 tablet (25 mg total) by mouth daily. 30 tablet 2   No current facility-administered medications for this visit.     PHYSICAL EXAMINATION: ECOG PERFORMANCE STATUS: 0 - Asymptomatic  Vitals:   06/08/22 0927  BP: (!) 134/92  Pulse: 92  Resp: 16  Temp:  98.1 F (36.7 C)  SpO2: 100%   Filed Weights   06/08/22 0927  Weight: 194 lb 3.2 oz (88.1 kg)    GENERAL:alert, no distress and comfortable SKIN: skin color, texture, turgor are normal, no rashes or significant lesions EYES: normal, conjunctiva are pink and non-injected, sclera clear OROPHARYNX:no exudate, no erythema and lips, buccal mucosa, and tongue normal  NECK: supple, thyroid normal size, non-tender, without nodularity LYMPH:  no palpable lymphadenopathy in the cervical, axillary  LUNGS: clear to auscultation and percussion with normal breathing effort HEART: regular rate & rhythm and no murmurs and no lower extremity edema ABDOMEN:abdomen soft, non-tender and normal bowel sounds Musculoskeletal:no cyanosis of digits and no clubbing  PSYCH: alert & oriented x 3 with fluent speech NEURO: no focal motor/sensory deficits  LABORATORY DATA:  I have reviewed the data as listed Lab Results  Component Value Date   WBC 4.3 05/28/2022   HGB 11.0 (L) 05/28/2022   HCT 32.2 (L) 05/28/2022  MCV 99 (H) 05/28/2022   PLT 68 (LL) 05/28/2022     Chemistry      Component Value Date/Time   NA 138 05/28/2022 1158   NA 141 08/04/2012 1408   K 3.7 05/28/2022 1158   K 4.0 08/04/2012 1408   CL 106 05/28/2022 1158   CL 109 (H) 08/04/2012 1408   CO2 19 (L) 05/28/2022 1158   CO2 24 08/04/2012 1408   BUN 4 (L) 05/28/2022 1158   BUN 10.5 08/04/2012 1408   CREATININE 0.54 (L) 05/28/2022 1158   CREATININE 0.9 08/04/2012 1408      Component Value Date/Time   CALCIUM 8.3 (L) 05/28/2022 1158   CALCIUM 9.1 08/04/2012 1408   ALKPHOS 100 05/28/2022 1158   ALKPHOS 135 08/04/2012 1408   AST 46 (H) 05/28/2022 1158   AST 52 (H) 08/04/2012 1408   ALT 19 05/28/2022 1158   ALT 46 08/04/2012 1408   BILITOT 1.5 (H) 05/28/2022 1158   BILITOT 1.57 (H) 08/04/2012 1408       RADIOGRAPHIC STUDIES: I have personally reviewed the radiological images as listed and agreed with the findings in the  report. No results found.  All questions were answered. The patient knows to call the clinic with any problems, questions or concerns. I spent 45 minutes in the care of this patient including H and P, review of records, counseling and coordination of care.     Rachel Moulds, MD 06/08/2022 9:28 AM

## 2022-06-10 LAB — FOLATE RBC
Folate, Hemolysate: 531 ng/mL
Folate, RBC: 1644 ng/mL (ref >498)
Hematocrit: 32.3 % — ABNORMAL LOW (ref 34.0–46.6)

## 2022-06-11 ENCOUNTER — Encounter: Payer: Self-pay | Admitting: Obstetrics & Gynecology

## 2022-06-26 ENCOUNTER — Encounter: Payer: Self-pay | Admitting: Obstetrics and Gynecology

## 2022-06-27 ENCOUNTER — Encounter: Payer: Self-pay | Admitting: Family Medicine

## 2022-06-27 ENCOUNTER — Other Ambulatory Visit: Payer: Self-pay

## 2022-06-27 ENCOUNTER — Ambulatory Visit (INDEPENDENT_AMBULATORY_CARE_PROVIDER_SITE_OTHER): Payer: Self-pay | Admitting: Family Medicine

## 2022-06-27 VITALS — BP 121/80 | HR 98 | Wt 195.0 lb

## 2022-06-27 DIAGNOSIS — O2342 Unspecified infection of urinary tract in pregnancy, second trimester: Secondary | ICD-10-CM

## 2022-06-27 DIAGNOSIS — R161 Splenomegaly, not elsewhere classified: Secondary | ICD-10-CM

## 2022-06-27 DIAGNOSIS — Z98891 History of uterine scar from previous surgery: Secondary | ICD-10-CM

## 2022-06-27 DIAGNOSIS — O0990 Supervision of high risk pregnancy, unspecified, unspecified trimester: Secondary | ICD-10-CM

## 2022-06-27 DIAGNOSIS — O09899 Supervision of other high risk pregnancies, unspecified trimester: Secondary | ICD-10-CM

## 2022-06-27 DIAGNOSIS — D693 Immune thrombocytopenic purpura: Secondary | ICD-10-CM

## 2022-06-27 DIAGNOSIS — K7460 Unspecified cirrhosis of liver: Secondary | ICD-10-CM

## 2022-06-27 NOTE — Patient Instructions (Signed)
Eleccin del mtodo anticonceptivo Contraception Choices La anticoncepcin, o los mtodos anticonceptivos, hace referencia a los mtodos o dispositivos que evitan el embarazo. Mtodos hormonales  Implante anticonceptivo Un implante anticonceptivo consiste en un tubo delgado de plstico que contiene una hormona que evita el embarazo. Es diferente de un dispositivo intrauterino (DIU). Un mdico lo inserta en la parte superior del brazo. Los implantes pueden ser eficaces durante un mximo de 3 aos. Inyecciones de progestina sola Las inyecciones de progestina sola contienen progestina, una forma sinttica de la hormona progesterona. Un mdico las administra cada 3 meses. Pldoras anticonceptivas Las pldoras anticonceptivas son pastillas que contienen hormonas que evitan el embarazo. Deben tomarse una vez al da, preferentemente a la misma hora cada da. Se necesita una receta para utilizar este mtodo anticonceptivo. Parche anticonceptivo El parche anticonceptivo contiene hormonas que evitan el embarazo. Se coloca en la piel, debe cambiarse una vez a la semana durante tres semanas y debe retirarse en la cuarta semana. Se necesita una receta para utilizar este mtodo anticonceptivo. Anillo vaginal Un anillo vaginal contiene hormonas que evitan el embarazo. Se coloca en la vagina durante tres semanas y se retira en la cuarta semana. Luego se repite el proceso con un anillo nuevo. Se necesita una receta para utilizar este mtodo anticonceptivo. Anticonceptivo de emergencia Los anticonceptivos de emergencia son mtodos para evitar un embarazo despus de tener sexo sin proteccin. Vienen en forma de pldora y pueden tomarse hasta 5 das despus de tener sexo. Funcionan mejor cuando se toman lo ms pronto posible luego de tener sexo. La mayora de los anticonceptivos de emergencia estn disponibles sin receta mdica. Este mtodo no debe utilizarse como el nico mtodo anticonceptivo. Mtodos de  barrera  Condn masculino Un condn masculino es una vaina delgada que se coloca sobre el pene durante el sexo. Los condones evitan que el esperma ingrese en el cuerpo de la mujer. Pueden utilizarse con un una sustancia que mata a los espermatozoides (espermicida) para aumentar la efectividad. Deben desecharse despus de un uso. Condn femenino Un condn femenino es una vaina blanda y holgada que se coloca en la vagina antes de tener sexo. El condn evita que el esperma ingrese en el cuerpo de la mujer. Deben desecharse despus de un uso. Diafragma Un diafragma es una barrera blanda con forma de cpula. Se inserta en la vagina antes del sexo, junto con un espermicida. El diafragma bloquea el ingreso de esperma en el tero, y el espermicida mata a los espermatozoides. El diafragma debe permanecer en la vagina durante 6 a 8 horas despus de tener sexo y debe retirarse en el plazo de las 24 horas. Un diafragma es recetado y colocado por un mdico. Debe reemplazarse cada 1 a 2 aos, despus de dar a luz, de aumentar ms de 15lb (6.8kg) y de una ciruga plvica. Capuchn cervical Un capuchn cervical es una copa redonda y blanda de ltex o plstico que se coloca en el cuello uterino. Se inserta en la vagina antes del sexo, junto con un espermicida. Bloquea el ingreso del esperma en el tero. El capuchn debe permanecer en el lugar durante 6 a 8 horas despus de tener sexo y debe retirarse en el plazo de las 48 horas. Un capuchn cervical debe ser recetado y colocado por un mdico. Debe reemplazarse cada 2aos. Esponja Una esponja es una pieza blanda y circular de espuma de poliuretano que contiene espermicida. La esponja ayuda a bloquear el ingreso de esperma en el tero, y el espermicida   mata a los espermatozoides. Para utilizarla, debe humedecerla e insertarla en la vagina. Debe insertarse antes de tener sexo, debe permanecer dentro al menos durante 6 horas despus de tener sexo y debe retirarse y  desecharse en el plazo de las 30 horas. Espermicidas Los espermicidas son sustancias qumicas que matan o bloquean al esperma y no lo dejan ingresar al cuello uterino y al tero. Vienen en forma de crema, gel, supositorio, espuma o comprimido. Un espermicida debe insertarse en la vagina con un aplicador al menos 10 o 15 minutos antes de tener sexo para dar tiempo a que surta efecto. El proceso debe repetirse cada vez que tenga sexo. Los espermicidas no requieren receta mdica. Anticonceptivos intrauterinos Dispositivo intrauterino (DIU) Un DIU es un dispositivo en forma de T que se coloca en el tero. Existen dos tipos: DIU hormonal.Este tipo contiene progestina, una forma sinttica de la hormona progesterona. Este tipo puede permanecer colocado durante 3 a 5 aos. DIU de cobre.Este tipo est recubierto con un alambre de cobre. Puede permanecer colocado durante 10 aos. Mtodos anticonceptivos permanentes Ligadura de trompas en la mujer En este mtodo, se sellan, atan u obstruyen las trompas de Falopio durante una ciruga para evitar que el vulo descienda hacia el tero. Esterilizacin histeroscpica En este mtodo, se coloca un implante pequeo y flexible dentro de cada trompa de Falopio. Los implantes hacen que se forme un tejido cicatricial en las trompas de Falopio y que las obstruya para que el espermatozoide no pueda llegar al vulo. El procedimiento demora alrededor de 3 meses para que sea efectivo. Debe utilizarse otro mtodo anticonceptivo durante esos 3 meses. Esterilizacin masculina Este es un procedimiento que consiste en atar los conductos que transportan el esperma (vasectoma). Luego del procedimiento, el hombre puede eyacular lquido (semen). Debe utilizarse otro mtodo anticonceptivo durante 3 meses despus del procedimiento. Mtodos de planificacin natural Planificacin familiar natural En este mtodo, la pareja no tiene sexo durante los das en que la mujer podra quedar  embarazada. Mtodo calendario En este mtodo, la mujer realiza un seguimiento de la duracin de cada ciclo menstrual, identifica los das en los que se puede producir un embarazo y no tiene sexo durante esos das. Mtodo de la ovulacin En este mtodo, la pareja evita tener sexo durante la ovulacin. Mtodo sintotrmico Este mtodo implica no tener sexo durante la ovulacin. Normalmente, la mujer comprueba la ovulacin al observar cambios en su temperatura y en la consistencia del moco cervical. Mtodo posovulacin En este mtodo, la pareja espera a que finalice la ovulacin para tener sexo. Dnde buscar ms informacin Centers for Disease Control and Prevention (Centros para el Control y la Prevencin de Enfermedades): www.cdc.gov Resumen La anticoncepcin, o los mtodos anticonceptivos, hace referencia a los mtodos o dispositivos que evitan el embarazo. Los mtodos anticonceptivos hormonales incluyen implantes, inyecciones, pastillas, parches, anillos vaginales y anticonceptivos de emergencia. Los mtodos anticonceptivos de barrera pueden incluir condones masculinos, condones femeninos, diafragmas, capuchones cervicales, esponjas y espermicidas. Existen dos tipos de DIU (dispositivo intrauterino). Un DIU puede colocarse en el tero de una mujer para evitar el embarazo durante 3 a 5 aos. La esterilizacin permanente puede realizarse mediante un procedimiento tanto en los hombres como en las mujeres. Los mtodos de planificacin familiar natural implican no tener sexo durante los das en que la mujer podra quedar embarazada. Esta informacin no tiene como fin reemplazar el consejo del mdico. Asegrese de hacerle al mdico cualquier pregunta que tenga. Document Revised: 09/14/2019 Document Reviewed: 09/14/2019 Elsevier Patient Education    2023 Elsevier Inc.  

## 2022-06-27 NOTE — Addendum Note (Signed)
Addended by: Isabell Jarvis on: 06/27/2022 03:04 PM   Modules accepted: Orders

## 2022-06-27 NOTE — Progress Notes (Signed)
Subjective:  Megan Fuller is a 37 y.o. G2P0101 at [redacted]w[redacted]d being seen today for ongoing prenatal care.  She is currently monitored for the following issues for this high-risk pregnancy and has PCOS (polycystic ovarian syndrome); E.coli UTI (urinary tract infection) during pregnancy, second trimester; Chronic ITP (idiopathic thrombocytopenia) (HCC); Depression affecting pregnancy in second trimester, antepartum; History of preterm delivery, currently pregnant; History of cesarean section, low transverse; Supervision of high risk pregnancy, antepartum; Obesity in pregnancy, antepartum; Cirrhosis (HCC); Proteinuria affecting pregnancy in second trimester; and Splenomegaly on their problem list.  Patient reports  general aching .  Contractions: Not present. Vag. Bleeding: None.  Movement: Present. Denies leaking of fluid.   The following portions of the patient's history were reviewed and updated as appropriate: allergies, current medications, past family history, past medical history, past social history, past surgical history and problem list. Problem list updated.  Objective:   Vitals:   06/27/22 1324  BP: 121/80  Pulse: 98  Weight: 195 lb (88.5 kg)    Fetal Status:     Movement: Present     General:  Alert, oriented and cooperative. Patient is in no acute distress.  Skin: Skin is warm and dry. No rash noted.   Cardiovascular: Normal heart rate noted  Respiratory: Normal respiratory effort, no problems with respiration noted  Abdomen: Soft, gravid, appropriate for gestational age. Pain/Pressure: Present     Pelvic: Vag. Bleeding: None     Cervical exam deferred        Extremities: Normal range of motion.     Mental Status: Normal mood and affect. Normal behavior. Normal judgment and thought content.   Urinalysis:      Assessment and Plan:  Pregnancy: G2P0101 at [redacted]w[redacted]d  1. Supervision of high risk pregnancy, antepartum BP and FHR normal Taking baby ASA  2. Chronic ITP  (idiopathic thrombocytopenia) (HCC) Likely secondary to cirrhosis of unclear etiology Seen by heme on 06/08/22, unclear if immune mediated, per their note more likely cirrhotic in origin. Basic labs sent which were unremarkable, recommendation against neuraxial anesthesia during deliivery  3. E.coli UTI (urinary tract infection) during pregnancy, second trimester Noted on new OB visit labs, rx sent for cefadroxil Reports she took full course of abx, will retest today, reports she still has symptoms  4. Cirrhosis of liver without ascites, unspecified hepatic cirrhosis type (HCC) Unclear etiology but has been longstanding on imaging Earliest imaging is Abd [place] from 12/05/2008 which shows splenomegaly but no liver changes Next liver imaging was on CT renal study from 01/19/2022 which showed nodular and cirrhotic liver Reports that at age 77 she was told she had her liver "inflamed" and she needed a biopsy, but her family did not agree to this Discussed with patient importance of determining the etiology She denies alcohol use, ferritin was normal on recent hematology labs, TSH was normal recently, and hep B and C were negative on new OB labs Recommend referral to GI as well as baseline labs to work up the etiology of her cirrhosis, she is amenable with this plan Referral placed Labs sent for Hep A Ab, ANA, anti-smooth muschle, anti microsomal liver/kidney Ab, Hep E Ab, CK, ceruloplasmin, alpha 1 antitrypsin, and tissue transglutaminase Will ask to have [employer] rep help her with [facility] financial aid application as this is a significant concern  5. History of cesarean section, low transverse Vertical skin but LTCS on uterus Does not want to [facility], also desires BTL Plan to discuss more in  detail at next visit  6. History of preterm delivery, currently pregnant At [redacted] weeks due to PPROM  7. Splenomegaly Likely secondary to cirrhosis, see above  Preterm labor symptoms and general obstetric  precautions including but not limited to vaginal bleeding, contractions, leaking of fluid and fetal movement were reviewed in detail with the patient. Please refer to After Visit Summary for other counseling recommendations.  Return in 4 weeks (on 07/25/2022) for [facility], ob visit, needs MD.   Venora Maples, MD

## 2022-06-28 LAB — PROTIME-INR
INR: 1.1 (ref 0.9–1.2)
Prothrombin Time: 11.8 s (ref 9.1–12.0)

## 2022-06-28 LAB — APTT: aPTT: 31 s (ref 24–33)

## 2022-06-29 LAB — URINE CULTURE, OB REFLEX

## 2022-06-29 LAB — CULTURE, OB URINE

## 2022-07-04 LAB — HEPATITIS E IGG ANTIBODIES: Value: 0.016

## 2022-07-04 LAB — TISSUE TRANSGLUTAMINASE, IGA: Transglutaminase IgA: <2 U/mL (ref 0–3)

## 2022-07-04 LAB — ANTI-MICROSOMAL ANTIBODY LIVER / KIDNEY: LKM1 Ab: 1.1 Units (ref 0.0–20.0)

## 2022-07-04 LAB — ALPHA-1-ANTITRYPSIN: A-1 Antitrypsin: 198 mg/dL — ABNORMAL HIGH (ref 100–188)

## 2022-07-04 LAB — CERULOPLASMIN: Ceruloplasmin: 38.8 mg/dL (ref 19.0–39.0)

## 2022-07-04 LAB — HEPATITIS A ANTIBODY, TOTAL: hep A Total Ab: POSITIVE — AB

## 2022-07-04 LAB — ANA: Anti Nuclear Antibody (ANA): NEGATIVE

## 2022-07-04 LAB — ANTI-SMOOTH MUSCLE ANTIBODY, IGG: Smooth Muscle Ab: 23 Units — ABNORMAL HIGH (ref 0–19)

## 2022-07-04 NOTE — Progress Notes (Signed)
Immune to hep A, vaccination not indicated Majority of labs for workup of possible auto-immune hepatitis are unremarkable Only abnormal labs are mildly elevated alpha-1-antitrypsin and weak positive smooth muscle Ab Alpha-1 unlikely to be clinically significant since levels are elevated not decreased I am uncertain as to the significant of her smooth muscle Ab titer, I have sent a message to the GI provider she is scheduled to see in two months Will discuss results with her at next visit

## 2022-07-09 ENCOUNTER — Ambulatory Visit (INDEPENDENT_AMBULATORY_CARE_PROVIDER_SITE_OTHER): Payer: Self-pay

## 2022-07-09 ENCOUNTER — Other Ambulatory Visit: Payer: Self-pay

## 2022-07-09 ENCOUNTER — Other Ambulatory Visit (HOSPITAL_COMMUNITY)
Admission: RE | Admit: 2022-07-09 | Discharge: 2022-07-09 | Disposition: A | Discharge status: Home / Self Care | Payer: Self-pay | Source: Ambulatory Visit | Attending: Family Medicine | Admitting: Family Medicine

## 2022-07-09 ENCOUNTER — Telehealth: Payer: Self-pay

## 2022-07-09 VITALS — BP 124/87 | HR 86 | Wt 198.5 lb

## 2022-07-09 DIAGNOSIS — O26892 Other specified pregnancy related conditions, second trimester: Secondary | ICD-10-CM

## 2022-07-09 DIAGNOSIS — Z3A22 22 weeks gestation of pregnancy: Secondary | ICD-10-CM

## 2022-07-09 DIAGNOSIS — N898 Other specified noninflammatory disorders of vagina: Secondary | ICD-10-CM | POA: Insufficient documentation

## 2022-07-09 NOTE — Telephone Encounter (Signed)
Pt made aware of Dr. Leone Payor recommendations: Pt was scheduled for an office visit on 08/09/2022 at 11:30 AM to see Dr. Leone Payor. Pt made aware Pt verbalized understanding with all questions answered.

## 2022-07-09 NOTE — Progress Notes (Signed)
Pt here today for vaginal discharge x 3 weeks that is itching and odor. Pt states had UTI in April and was treated with Rx Duricef. Pt states also having nausea. Pt is 22+[redacted] weeks pregnant with +FM.  Pt advised to get self swab today due to symptoms. Self swab collected today. Pt advised results will take 24-48 hours and will see results in mychart and will be notified if needs further treatment.  Pt verbalized understanding.   Judeth Cornfield, RNC

## 2022-07-09 NOTE — Patient Instructions (Addendum)
June 14th appt at [facility] Gastroenterology  Located in: [place] [address] Address: [address], [place], [place] [address]

## 2022-07-09 NOTE — Telephone Encounter (Signed)
----- Message from Megan Maples, MD sent at 07/09/2022  8:35 AM EDT ----- Regarding: RE: upcoming visit I appreciate the team work! Much appreciated Megan Fuller.  Thanks, Megan Fuller ----- Message ----- From: Megan Boop, MD Sent: 07/09/2022   7:56 AM EDT To: Megan Darling, RN; Megan Natal, Megan Fuller; # Subject: RE: upcoming visit                             [facility],  I will get her added on tomy schedule in June and we will figure this out.  Megan Fuller - please add on to me 6/14 1130 and take off [facility] schedule inJuly  Megan Fuller ----- Message ----- From: Megan Natal, Megan Fuller Sent: 07/08/2022   2:06 PM EDT To: Megan Boop, MD; Megan Maples, MD Subject: RE: upcoming visit                             Hello Dr. Crissie Fuller, I did not realize that she was uninsured. That is a tough situation. The potential concern for a pregnant patient with cirrhosis is the risk of having esophageal varices which could bleed. The safest window to do an EGD to survey for esophageal varices is during the 2nd trimester and she will be entering the 3rd trimester in a few weeks.  I am concerned that she could have portal hypertension and esophageal varices with a platelet count of 76. I will ask Megan Fuller to weigh in as this patient is above my scope of practice.  Megan Fuller       ----- Message ----- From: Megan Maples, MD Sent: 07/05/2022   9:42 AM EDT To: Megan Natal, Megan Fuller Subject: RE: upcoming visit                             Hi Megan Fuller,  Thanks for taking a look, totally understand referral to a tertiary care center. Do you by any chance know how that would work with her not having insurance? It was a big hill to climb to get her to even agree to a referral and I know the process for financial aid through [facility], just want to make sure I'm not getting her into a bad situation financially.  Thanks, [facility]   ----- Message ----- From: Megan Natal,  Megan Fuller Sent: 07/04/2022   5:16 PM EDT To: Megan Boop, MD; Megan Maples, MD Subject: RE: upcoming visit                             Hello Dr. Crissie Fuller, thank you for reaching out and great hepatology work up on your part. I reviewed her April - May 2024 labs and November 2023 renal CT results. She is a high risk patient in setting of being [redacted] weeks gestation with cirrhosis of unclear etiology.  I will gladly see her as scheduled in our GI clinic, however, I recommend for you to refer Megan Fuller to Megan Fuller with [facility] in [place]. I discussed this case with Megan Fuller and he also felt your patient should be seen by the [facility].  Sincerely, Megan Fuller      ----- Message ----- From: Megan Maples, MD Sent: 07/04/2022   4:22  PM EDT To: Megan Natal, Megan Fuller Subject: upcoming visit                                 Hello,  I'm caring for this patient who is currently pregnant. She has cirrhotic imaging features and has apparently been told she has liver issues since she was a teenager. I have referred her to GI and is seeing you in a couple of months.   I did auto-immune labs and the only lab of note is a mildly positive smooth muscle antibody (alpha-1 was mildly ELEVATED, I'm guessing that's not clinically significant since no deficiency is present). In addition I did coags which were normal.  Is there anything else you would recommend doing at this time? I wanted to check in with you given how far out her appointment is.  Thanks,  Megan Harman, MD/MPH Attending Family Medicine Physician, [facility] for [employer], [facility]

## 2022-07-10 ENCOUNTER — Other Ambulatory Visit: Payer: Self-pay | Admitting: Family Medicine

## 2022-07-10 ENCOUNTER — Telehealth: Payer: Self-pay

## 2022-07-10 LAB — CERVICOVAGINAL ANCILLARY ONLY
Bacterial Vaginitis (gardnerella): POSITIVE — AB
Candida Glabrata: NEGATIVE
Candida Vaginitis: POSITIVE — AB
Chlamydia: NEGATIVE
Comment: NEGATIVE
Comment: NEGATIVE
Comment: NEGATIVE
Comment: NEGATIVE
Comment: NEGATIVE
Comment: NORMAL
Neisseria Gonorrhea: NEGATIVE
Trichomonas: NEGATIVE

## 2022-07-10 MED ORDER — FLUCONAZOLE 150 MG PO TABS: 150.0000 mg | ORAL_TABLET | Freq: Once | ORAL | 0 refills | Status: AC

## 2022-07-10 MED ORDER — METRONIDAZOLE 0.75 % VA GEL: 1.0000 | Freq: Every day | VAGINAL | 0 refills | Status: AC

## 2022-07-10 NOTE — Telephone Encounter (Signed)
-----   Message from Venora Maples, MD sent at 07/10/2022  3:47 PM EDT ----- Inadvertently did not forward, please see prior result note, contact patient to let her know about BV/yeast and that prescription has been sent

## 2022-07-10 NOTE — Telephone Encounter (Signed)
Called Pt using [employer] id# [phone] to advise Pt of positive yeast & BV results and that Rx was sent to pharmacy on file. Pt wanted to know if they were sexually transmitted, advised no, went over reasons possible for infections. Pt verbalized understanding.

## 2022-07-16 ENCOUNTER — Inpatient Hospital Stay
Payer: No Typology Code available for payment source | Attending: Hematology and Oncology | Admitting: Hematology and Oncology

## 2022-07-23 ENCOUNTER — Inpatient Hospital Stay (HOSPITAL_COMMUNITY)
Admission: AD | Admit: 2022-07-23 | Discharge: 2022-07-23 | Disposition: A | Discharge status: Home / Self Care | Payer: Self-pay | Attending: Family Medicine | Admitting: Family Medicine

## 2022-07-23 ENCOUNTER — Encounter: Payer: Self-pay | Admitting: Obstetrics & Gynecology

## 2022-07-23 ENCOUNTER — Encounter (HOSPITAL_COMMUNITY): Payer: Self-pay | Admitting: Family Medicine

## 2022-07-23 DIAGNOSIS — N898 Other specified noninflammatory disorders of vagina: Secondary | ICD-10-CM

## 2022-07-23 DIAGNOSIS — O26899 Other specified pregnancy related conditions, unspecified trimester: Secondary | ICD-10-CM

## 2022-07-23 DIAGNOSIS — O09212 Supervision of pregnancy with history of pre-term labor, second trimester: Secondary | ICD-10-CM | POA: Insufficient documentation

## 2022-07-23 DIAGNOSIS — R109 Unspecified abdominal pain: Secondary | ICD-10-CM

## 2022-07-23 DIAGNOSIS — Z3A25 25 weeks gestation of pregnancy: Secondary | ICD-10-CM | POA: Insufficient documentation

## 2022-07-23 DIAGNOSIS — O26892 Other specified pregnancy related conditions, second trimester: Secondary | ICD-10-CM | POA: Insufficient documentation

## 2022-07-23 DIAGNOSIS — O99112 Other diseases of the blood and blood-forming organs and certain disorders involving the immune mechanism complicating pregnancy, second trimester: Secondary | ICD-10-CM | POA: Insufficient documentation

## 2022-07-23 DIAGNOSIS — O26612 Liver and biliary tract disorders in pregnancy, second trimester: Secondary | ICD-10-CM | POA: Insufficient documentation

## 2022-07-23 DIAGNOSIS — O99282 Endocrine, nutritional and metabolic diseases complicating pregnancy, second trimester: Secondary | ICD-10-CM | POA: Insufficient documentation

## 2022-07-23 DIAGNOSIS — Z3A24 24 weeks gestation of pregnancy: Secondary | ICD-10-CM

## 2022-07-23 DIAGNOSIS — D693 Immune thrombocytopenic purpura: Secondary | ICD-10-CM | POA: Insufficient documentation

## 2022-07-23 DIAGNOSIS — K7460 Unspecified cirrhosis of liver: Secondary | ICD-10-CM | POA: Insufficient documentation

## 2022-07-23 LAB — URINALYSIS, ROUTINE W REFLEX MICROSCOPIC
Bilirubin Urine: NEGATIVE
Glucose, UA: NEGATIVE mg/dL
Hgb urine dipstick: NEGATIVE
Ketones, ur: NEGATIVE mg/dL
Leukocytes,Ua: NEGATIVE
Nitrite: NEGATIVE
Protein, ur: NEGATIVE mg/dL
Specific Gravity, Urine: 1.017 (ref 1.005–1.030)
pH: 6 (ref 5.0–8.0)

## 2022-07-23 LAB — WET PREP, GENITAL
Clue Cells Wet Prep HPF POC: NONE SEEN
Sperm: NONE SEEN
Trich, Wet Prep: NONE SEEN
WBC, Wet Prep HPF POC: >=10 — AB (ref <10)
Yeast Wet Prep HPF POC: NONE SEEN

## 2022-07-23 LAB — FETAL FIBRONECTIN: Fetal Fibronectin: NEGATIVE

## 2022-07-23 NOTE — MAU Provider Note (Signed)
History     CSN: [id number]  Arrival date and time: 07/23/22 1244   Event Date/Time   First Provider Initiated Contact with Patient 07/23/22 1352      Chief Complaint  Patient presents with   Contractions   Ms. Megan Fuller is a 37 y.o. year old G48P0101 female at [redacted]w[redacted]d weeks gestation who presents to MAU reporting intermittent contractions since last night; every 10-20 minutes. She reports that sometimes the contractions are mild to strong; rated 3/10 to 8/10. She also reports vaginal discharge for the past 3 months. She was recently treated on 07/09/2022 for BV and yeast infections. She is "very worried", because this is how she felt with her last baby that she had at 25 weeks (11/2019). She denies VB. She reports good (+) FM. Her high-risk pregnancy is complicated by cirrhosis of liver, ITP, PTL, and PTB@ 25 wks. She receives [facility] with [facility]; next appt is 07/25/2022.    OB History     Gravida  2   Para  1   Term      Preterm  1   AB      Living  1      SAB      IAB      Ectopic      Multiple  0   Live Births  1           Past Medical History:  Diagnosis Date   Cirrhosis of liver (HCC)    Noted on 12/2021 CT scan. LFTs reassuring. Negative Hepatitis evaluation at [facility] in 2018. Patient had liver issues as a child, biopsy recommended but she her family did not want to do this. No history of alcoholism.   PCOS (polycystic ovarian syndrome)     Past Surgical History:  Procedure Laterality Date   CESAREAN SECTION N/A 12/26/2019   Procedure: CESAREAN SECTION;  Surgeon: Altoona Bing, MD;  Location: [facility] LD ORS;  Service: Obstetrics;  Laterality: N/A;    Family History  Problem Relation Age of Onset   Diabetes Mother     Social History   Tobacco Use   Smoking status: Never   Smokeless tobacco: Never  Vaping Use   Vaping Use: Never used  Substance Use Topics   Alcohol use: No    Alcohol/week: 0.0 standard drinks of alcohol   Drug use: No     Allergies: No Known Allergies  No medications prior to admission.    Review of Systems  Constitutional: Negative.   HENT: Negative.    Eyes: Negative.   Respiratory: Negative.    Cardiovascular: Negative.   Gastrointestinal: Negative.   Endocrine: Negative.   Genitourinary:  Positive for pelvic pain.  Musculoskeletal: Negative.   Skin: Negative.   Allergic/Immunologic: Negative.   Neurological: Negative.   Hematological: Negative.   Psychiatric/Behavioral: Negative.     Physical Exam   Blood pressure 132/80, pulse 86, temperature 98.4 F (36.9 C), resp. rate 17, height 5\' 5"  (1.651 m), weight 91.2 kg, SpO2 98 %, unknown if currently breastfeeding.  Physical Exam Vitals and nursing note reviewed. Exam conducted with a chaperone present.  Constitutional:      Appearance: Normal appearance. She is obese.  Cardiovascular:     Rate and Rhythm: Normal rate.  Pulmonary:     Effort: Pulmonary effort is normal.  Abdominal:     Palpations: Abdomen is soft.  Genitourinary:    General: Normal vulva.     Comments: Pelvic exam: External genitalia normal, SE:  vaginal walls pink and well rugated, cervix is smooth, pink, no lesions, ectropion encircling the cervical os, scant amt of thick, white vaginal d/c -- WP, GC/CT and fFN done, cervix visually closed, Uterus is non-tender, S=D, no CMT, mildly friabile, no adnexal tenderness.  Dilation: Closed Exam by:: Raelyn Mora, CNM.  Musculoskeletal:        General: Normal range of motion.  Skin:    General: Skin is warm and dry.  Neurological:     Mental Status: She is alert and oriented to person, place, and time.  Psychiatric:        Mood and Affect: Mood normal.        Behavior: Behavior normal.        Thought Content: Thought content normal.        Judgment: Judgment normal.    REASSURING NST - FHR: 150 bpm / moderate variability / 10 x 10 accels present / decels absent / TOCO: occ UI noted  MAU Course   Procedures  MDM CCUA Wet Prep GC/CT --Results pending  fFN Offered medication for pain >>patient declined d/t "bad liver"  Results for orders placed or performed during the hospital encounter of 07/23/22 (from the past 24 hour(s))  Urinalysis, Routine w reflex microscopic -Urine, Clean Catch     Status: Abnormal   Collection Time: 07/23/22  1:18 PM  Result Value Ref Range   Color, Urine AMBER (A) YELLOW   APPearance CLEAR CLEAR   Specific Gravity, Urine 1.017 1.005 - 1.030   pH 6.0 5.0 - 8.0   Glucose, UA NEGATIVE NEGATIVE mg/dL   Hgb urine dipstick NEGATIVE NEGATIVE   Bilirubin Urine NEGATIVE NEGATIVE   Ketones, ur NEGATIVE NEGATIVE mg/dL   Protein, ur NEGATIVE NEGATIVE mg/dL   Nitrite NEGATIVE NEGATIVE   Leukocytes,Ua NEGATIVE NEGATIVE  Wet prep, genital     Status: Abnormal   Collection Time: 07/23/22  2:07 PM  Result Value Ref Range   Yeast Wet Prep HPF POC NONE SEEN NONE SEEN   Trich, Wet Prep NONE SEEN NONE SEEN   Clue Cells Wet Prep HPF POC NONE SEEN NONE SEEN   WBC, Wet Prep HPF POC >=10 (A) <10   Sperm NONE SEEN   Fetal fibronectin     Status: None   Collection Time: 07/23/22  2:07 PM  Result Value Ref Range   Fetal Fibronectin NEGATIVE NEGATIVE     Assessment and Plan  1. Abdominal pain affecting pregnancy - Information provided on abdominal pain in pregnancy   2. History of preterm labor, current pregnancy, second trimester  3. Vaginal discharge - Reassurance given twice that she has no infection - Explained that some vaginal discharge changes are normal in pregnancy  4. [redacted] weeks gestation of pregnancy   - Discharge patient - Keep scheduled appt with [facility] on 07/25/2022 - Patient verbalized an understanding of the plan of care and agrees.   **IN-Person [employer], Bon[place] Fava and [place] used for entire visit**  Raelyn Mora, CNM 07/23/2022, 1:52 PM

## 2022-07-23 NOTE — MAU Note (Signed)
.  Megan Fuller is a 37 y.o. at [redacted]w[redacted]d here in MAU reporting: ctx on and off since last night 10-20 min. Sometimes they are mild sometimes they are strong 3-8. Pt reports she has discharge for about 3 months she has been treated for a vaginal infection but still has the discharge. Pt had a baby at 25 weeks before and it started like this.  LMP:  Onset of complaint: last night Pain score: 3-8 Vitals:   07/23/22 1255  BP: (!) 146/87  Pulse: 98  Resp: 18  Temp: 98.4 F (36.9 C)     FHT:166 Lab orders placed from triage:  u/a

## 2022-07-24 LAB — GC/CHLAMYDIA PROBE AMP (~~LOC~~) NOT AT ARMC
Chlamydia: NEGATIVE
Comment: NEGATIVE
Comment: NORMAL
Neisseria Gonorrhea: NEGATIVE

## 2022-07-25 ENCOUNTER — Encounter: Payer: Self-pay | Admitting: Family Medicine

## 2022-07-25 ENCOUNTER — Other Ambulatory Visit: Payer: Self-pay

## 2022-07-25 ENCOUNTER — Ambulatory Visit (INDEPENDENT_AMBULATORY_CARE_PROVIDER_SITE_OTHER): Payer: Self-pay | Admitting: Family Medicine

## 2022-07-25 VITALS — BP 134/83 | HR 98 | Wt 202.0 lb

## 2022-07-25 DIAGNOSIS — Z3A25 25 weeks gestation of pregnancy: Secondary | ICD-10-CM

## 2022-07-25 DIAGNOSIS — K7460 Unspecified cirrhosis of liver: Secondary | ICD-10-CM

## 2022-07-25 DIAGNOSIS — O09892 Supervision of other high risk pregnancies, second trimester: Secondary | ICD-10-CM

## 2022-07-25 DIAGNOSIS — Z98891 History of uterine scar from previous surgery: Secondary | ICD-10-CM

## 2022-07-25 DIAGNOSIS — O0990 Supervision of high risk pregnancy, unspecified, unspecified trimester: Secondary | ICD-10-CM

## 2022-07-25 DIAGNOSIS — D693 Immune thrombocytopenic purpura: Secondary | ICD-10-CM

## 2022-07-25 DIAGNOSIS — O0992 Supervision of high risk pregnancy, unspecified, second trimester: Secondary | ICD-10-CM

## 2022-07-25 DIAGNOSIS — R161 Splenomegaly, not elsewhere classified: Secondary | ICD-10-CM

## 2022-07-25 DIAGNOSIS — O09899 Supervision of other high risk pregnancies, unspecified trimester: Secondary | ICD-10-CM

## 2022-07-25 NOTE — Patient Instructions (Signed)
Eleccin del mtodo anticonceptivo Contraception Choices La anticoncepcin, o los mtodos anticonceptivos, hace referencia a los mtodos o dispositivos que evitan el embarazo. Mtodos hormonales  Implante anticonceptivo Un implante anticonceptivo consiste en un tubo delgado de plstico que contiene una hormona que evita el embarazo. Es diferente de un dispositivo intrauterino (DIU). Un mdico lo inserta en la parte superior del brazo. Los implantes pueden ser eficaces durante un mximo de 3 aos. Inyecciones de progestina sola Las inyecciones de progestina sola contienen progestina, una forma sinttica de la hormona progesterona. Un mdico las administra cada 3 meses. Pldoras anticonceptivas Las pldoras anticonceptivas son pastillas que contienen hormonas que evitan el embarazo. Deben tomarse una vez al da, preferentemente a la misma hora cada da. Se necesita una receta para utilizar este mtodo anticonceptivo. Parche anticonceptivo El parche anticonceptivo contiene hormonas que evitan el embarazo. Se coloca en la piel, debe cambiarse una vez a la semana durante tres semanas y debe retirarse en la cuarta semana. Se necesita una receta para utilizar este mtodo anticonceptivo. Anillo vaginal Un anillo vaginal contiene hormonas que evitan el embarazo. Se coloca en la vagina durante tres semanas y se retira en la cuarta semana. Luego se repite el proceso con un anillo nuevo. Se necesita una receta para utilizar este mtodo anticonceptivo. Anticonceptivo de emergencia Los anticonceptivos de emergencia son mtodos para evitar un embarazo despus de tener sexo sin proteccin. Vienen en forma de pldora y pueden tomarse hasta 5 das despus de tener sexo. Funcionan mejor cuando se toman lo ms pronto posible luego de tener sexo. La mayora de los anticonceptivos de emergencia estn disponibles sin receta mdica. Este mtodo no debe utilizarse como el nico mtodo anticonceptivo. Mtodos de  barrera  Condn masculino Un condn masculino es una vaina delgada que se coloca sobre el pene durante el sexo. Los condones evitan que el esperma ingrese en el cuerpo de la mujer. Pueden utilizarse con un una sustancia que mata a los espermatozoides (espermicida) para aumentar la efectividad. Deben desecharse despus de un uso. Condn femenino Un condn femenino es una vaina blanda y holgada que se coloca en la vagina antes de tener sexo. El condn evita que el esperma ingrese en el cuerpo de la mujer. Deben desecharse despus de un uso. Diafragma Un diafragma es una barrera blanda con forma de cpula. Se inserta en la vagina antes del sexo, junto con un espermicida. El diafragma bloquea el ingreso de esperma en el tero, y el espermicida mata a los espermatozoides. El diafragma debe permanecer en la vagina durante 6 a 8 horas despus de tener sexo y debe retirarse en el plazo de las 24 horas. Un diafragma es recetado y colocado por un mdico. Debe reemplazarse cada 1 a 2 aos, despus de dar a luz, de aumentar ms de 15lb (6.8kg) y de una ciruga plvica. Capuchn cervical Un capuchn cervical es una copa redonda y blanda de ltex o plstico que se coloca en el cuello uterino. Se inserta en la vagina antes del sexo, junto con un espermicida. Bloquea el ingreso del esperma en el tero. El capuchn debe permanecer en el lugar durante 6 a 8 horas despus de tener sexo y debe retirarse en el plazo de las 48 horas. Un capuchn cervical debe ser recetado y colocado por un mdico. Debe reemplazarse cada 2aos. Esponja Una esponja es una pieza blanda y circular de espuma de poliuretano que contiene espermicida. La esponja ayuda a bloquear el ingreso de esperma en el tero, y el espermicida   mata a los espermatozoides. Para utilizarla, debe humedecerla e insertarla en la vagina. Debe insertarse antes de tener sexo, debe permanecer dentro al menos durante 6 horas despus de tener sexo y debe retirarse y  desecharse en el plazo de las 30 horas. Espermicidas Los espermicidas son sustancias qumicas que matan o bloquean al esperma y no lo dejan ingresar al cuello uterino y al tero. Vienen en forma de crema, gel, supositorio, espuma o comprimido. Un espermicida debe insertarse en la vagina con un aplicador al menos 10 o 15 minutos antes de tener sexo para dar tiempo a que surta efecto. El proceso debe repetirse cada vez que tenga sexo. Los espermicidas no requieren receta mdica. Anticonceptivos intrauterinos Dispositivo intrauterino (DIU) Un DIU es un dispositivo en forma de T que se coloca en el tero. Existen dos tipos: DIU hormonal.Este tipo contiene progestina, una forma sinttica de la hormona progesterona. Este tipo puede permanecer colocado durante 3 a 5 aos. DIU de cobre.Este tipo est recubierto con un alambre de cobre. Puede permanecer colocado durante 10 aos. Mtodos anticonceptivos permanentes Ligadura de trompas en la mujer En este mtodo, se sellan, atan u obstruyen las trompas de Falopio durante una ciruga para evitar que el vulo descienda hacia el tero. Esterilizacin histeroscpica En este mtodo, se coloca un implante pequeo y flexible dentro de cada trompa de Falopio. Los implantes hacen que se forme un tejido cicatricial en las trompas de Falopio y que las obstruya para que el espermatozoide no pueda llegar al vulo. El procedimiento demora alrededor de 3 meses para que sea efectivo. Debe utilizarse otro mtodo anticonceptivo durante esos 3 meses. Esterilizacin masculina Este es un procedimiento que consiste en atar los conductos que transportan el esperma (vasectoma). Luego del procedimiento, el hombre puede eyacular lquido (semen). Debe utilizarse otro mtodo anticonceptivo durante 3 meses despus del procedimiento. Mtodos de planificacin natural Planificacin familiar natural En este mtodo, la pareja no tiene sexo durante los das en que la mujer podra quedar  embarazada. Mtodo calendario En este mtodo, la mujer realiza un seguimiento de la duracin de cada ciclo menstrual, identifica los das en los que se puede producir un embarazo y no tiene sexo durante esos das. Mtodo de la ovulacin En este mtodo, la pareja evita tener sexo durante la ovulacin. Mtodo sintotrmico Este mtodo implica no tener sexo durante la ovulacin. Normalmente, la mujer comprueba la ovulacin al observar cambios en su temperatura y en la consistencia del moco cervical. Mtodo posovulacin En este mtodo, la pareja espera a que finalice la ovulacin para tener sexo. Dnde buscar ms informacin Centers for Disease Control and Prevention (Centros para el Control y la Prevencin de Enfermedades): www.cdc.gov Resumen La anticoncepcin, o los mtodos anticonceptivos, hace referencia a los mtodos o dispositivos que evitan el embarazo. Los mtodos anticonceptivos hormonales incluyen implantes, inyecciones, pastillas, parches, anillos vaginales y anticonceptivos de emergencia. Los mtodos anticonceptivos de barrera pueden incluir condones masculinos, condones femeninos, diafragmas, capuchones cervicales, esponjas y espermicidas. Existen dos tipos de DIU (dispositivo intrauterino). Un DIU puede colocarse en el tero de una mujer para evitar el embarazo durante 3 a 5 aos. La esterilizacin permanente puede realizarse mediante un procedimiento tanto en los hombres como en las mujeres. Los mtodos de planificacin familiar natural implican no tener sexo durante los das en que la mujer podra quedar embarazada. Esta informacin no tiene como fin reemplazar el consejo del mdico. Asegrese de hacerle al mdico cualquier pregunta que tenga. Document Revised: 09/14/2019 Document Reviewed: 09/14/2019 Elsevier Patient Education    2024 Elsevier Inc.  

## 2022-07-25 NOTE — Progress Notes (Signed)
Subjective:  Megan Fuller is a 37 y.o. G2P0101 at [redacted]w[redacted]d being seen today for ongoing prenatal care.  She is currently monitored for the following issues for this high-risk pregnancy and has PCOS (polycystic ovarian syndrome); E.coli UTI (urinary tract infection) during pregnancy, second trimester; Chronic ITP (idiopathic thrombocytopenia) (HCC); Depression affecting pregnancy in second trimester, antepartum; History of preterm delivery, currently pregnant; History of cesarean section, low transverse; Supervision of high risk pregnancy, antepartum; Obesity in pregnancy, antepartum; Cirrhosis (HCC); Proteinuria affecting pregnancy in second trimester; and Splenomegaly on their problem list.  Patient reports fatigue.  Contractions: Irritability. Vag. Bleeding: None.  Movement: Present. Denies leaking of fluid.   The following portions of the patient's history were reviewed and updated as appropriate: allergies, current medications, past family history, past medical history, past social history, past surgical history and problem list. Problem list updated.  Objective:   Vitals:   07/25/22 1328  BP: 134/83  Pulse: 98  Weight: 202 lb (91.6 kg)    Fetal Status: Fetal Heart Rate (bpm): 157   Movement: Present     General:  Alert, oriented and cooperative. Patient is in no acute distress.  Skin: Skin is warm and dry. No rash noted.   Cardiovascular: Normal heart rate noted  Respiratory: Normal respiratory effort, no problems with respiration noted  Abdomen: Soft, gravid, appropriate for gestational age. Pain/Pressure: Present     Pelvic: Vag. Bleeding: None     Cervical exam deferred        Extremities: Normal range of motion.     Mental Status: Normal mood and affect. Normal behavior. Normal judgment and thought content.   Urinalysis:      Assessment and Plan:  Pregnancy: G2P0101 at [redacted]w[redacted]d  1. Supervision of high risk pregnancy, antepartum BP and FHR normal Taking baby  ASA Feeling very fatigued, hasn't been past this point in pregnancy before Having some nosebleeds, exacerbated by thrombocytopenia but also advised humidifier at night and nasal vaseline No growth scan performed yet due to financial concerns, we will schedule ASAP Discussed fasting labs for next visit  2. Chronic ITP (idiopathic thrombocytopenia) (HCC) Likely secondary to cirrhosis of unclear etiology Seen by heme on 06/08/22, unclear if immune mediated, per their note more likely cirrhotic in origin. Basic labs sent which were unremarkable, recommendation against neuraxial anesthesia during delivery No showed follow up visit on 07/16/2022, does not feel it will be helpful based on her last conversation with them Given lack of evidence of primary hematologic process I am in agreement, repeat eval can be deferred until after GI evaluation  3. Cirrhosis of liver without ascites, unspecified hepatic cirrhosis type (HCC) Unclear etiology but has been longstanding on imaging Earliest imaging is Abd [place] from 12/05/2008 which shows splenomegaly but no liver changes Next liver imaging was on CT renal study from 01/19/2022 which showed nodular and cirrhotic liver Reports that at age 30 she was told she had her liver "inflamed" and she needed a biopsy, but her family did not agree to this She denies alcohol use, ferritin was normal on recent hematology labs, TSH was normal recently, and hep B and C were negative on new OB labs  At last visit labs sent for Hep A Ab, ANA, anti-smooth muschle, anti microsomal liver/kidney Ab, Hep E Ab, CK, ceruloplasmin, alpha 1 antitrypsin, and tissue transglutaminase  Only abnormal lab was anti-microsomal Ab-Liver/Kidney After discussion with GI via staff message her GI clinic appt has been moved up to 08/09/2022 with Dr.  Gessner  4. History of cesarean section, low transverse Vertical skin but LTCS on uterus Does not want to [facility], also desires BTL Discussed in detail  today, would like RCS+bilateral salpingectomy Understands that due to thrombocytopenia will need to have general anesthesia  5. History of preterm delivery, currently pregnant At [redacted] weeks due to PPROM  6. Splenomegaly Likely secondary to cirrhosis, see above  Preterm labor symptoms and general obstetric precautions including but not limited to vaginal bleeding, contractions, leaking of fluid and fetal movement were reviewed in detail with the patient. Please refer to After Visit Summary for other counseling recommendations.  Return in 2 weeks (on 08/08/2022) for [facility], ob visit, needs MD, 28 wk labs.   Venora Maples, MD

## 2022-07-29 ENCOUNTER — Inpatient Hospital Stay (HOSPITAL_COMMUNITY)
Admission: AD | Admit: 2022-07-29 | Discharge: 2022-07-29 | Disposition: A | Discharge status: Home / Self Care | Payer: Self-pay | Attending: Family Medicine | Admitting: Family Medicine

## 2022-07-29 ENCOUNTER — Inpatient Hospital Stay (HOSPITAL_COMMUNITY): Payer: Self-pay

## 2022-07-29 ENCOUNTER — Other Ambulatory Visit: Payer: Self-pay

## 2022-07-29 ENCOUNTER — Encounter (HOSPITAL_COMMUNITY): Payer: Self-pay | Admitting: Family Medicine

## 2022-07-29 DIAGNOSIS — O26612 Liver and biliary tract disorders in pregnancy, second trimester: Secondary | ICD-10-CM | POA: Insufficient documentation

## 2022-07-29 DIAGNOSIS — O99282 Endocrine, nutritional and metabolic diseases complicating pregnancy, second trimester: Secondary | ICD-10-CM | POA: Insufficient documentation

## 2022-07-29 DIAGNOSIS — E871 Hypo-osmolality and hyponatremia: Secondary | ICD-10-CM | POA: Insufficient documentation

## 2022-07-29 DIAGNOSIS — O99019 Anemia complicating pregnancy, unspecified trimester: Secondary | ICD-10-CM

## 2022-07-29 DIAGNOSIS — O99891 Other specified diseases and conditions complicating pregnancy: Secondary | ICD-10-CM | POA: Insufficient documentation

## 2022-07-29 DIAGNOSIS — Z3A25 25 weeks gestation of pregnancy: Secondary | ICD-10-CM | POA: Insufficient documentation

## 2022-07-29 DIAGNOSIS — O99012 Anemia complicating pregnancy, second trimester: Secondary | ICD-10-CM

## 2022-07-29 DIAGNOSIS — Z7982 Long term (current) use of aspirin: Secondary | ICD-10-CM | POA: Insufficient documentation

## 2022-07-29 DIAGNOSIS — E860 Dehydration: Secondary | ICD-10-CM

## 2022-07-29 DIAGNOSIS — O212 Late vomiting of pregnancy: Secondary | ICD-10-CM | POA: Insufficient documentation

## 2022-07-29 DIAGNOSIS — O219 Vomiting of pregnancy, unspecified: Secondary | ICD-10-CM

## 2022-07-29 DIAGNOSIS — O2312 Infections of bladder in pregnancy, second trimester: Secondary | ICD-10-CM | POA: Insufficient documentation

## 2022-07-29 DIAGNOSIS — D539 Nutritional anemia, unspecified: Secondary | ICD-10-CM | POA: Insufficient documentation

## 2022-07-29 DIAGNOSIS — K7460 Unspecified cirrhosis of liver: Secondary | ICD-10-CM | POA: Insufficient documentation

## 2022-07-29 DIAGNOSIS — N3001 Acute cystitis with hematuria: Secondary | ICD-10-CM | POA: Insufficient documentation

## 2022-07-29 DIAGNOSIS — O26892 Other specified pregnancy related conditions, second trimester: Secondary | ICD-10-CM | POA: Insufficient documentation

## 2022-07-29 LAB — URINALYSIS, ROUTINE W REFLEX MICROSCOPIC
Bilirubin Urine: NEGATIVE
Glucose, UA: NEGATIVE mg/dL
Ketones, ur: 20 mg/dL — AB
Nitrite: NEGATIVE
Protein, ur: NEGATIVE mg/dL
Specific Gravity, Urine: 1.016 (ref 1.005–1.030)
pH: 6 (ref 5.0–8.0)

## 2022-07-29 LAB — WET PREP, GENITAL
Clue Cells Wet Prep HPF POC: NONE SEEN
Sperm: NONE SEEN
Trich, Wet Prep: NONE SEEN
WBC, Wet Prep HPF POC: >=10 — AB (ref <10)
Yeast Wet Prep HPF POC: NONE SEEN

## 2022-07-29 LAB — CBC
HCT: 28.6 % — ABNORMAL LOW (ref 36.0–46.0)
Hemoglobin: 10.1 g/dL — ABNORMAL LOW (ref 12.0–15.0)
MCH: 35.4 pg — ABNORMAL HIGH (ref 26.0–34.0)
MCHC: 35.3 g/dL (ref 30.0–36.0)
MCV: 100.4 fL — ABNORMAL HIGH (ref 80.0–100.0)
Platelets: 45 10*3/uL — ABNORMAL LOW (ref 150–400)
RBC: 2.85 MIL/uL — ABNORMAL LOW (ref 3.87–5.11)
RDW: 13.9 % (ref 11.5–15.5)
WBC: 11.5 10*3/uL — ABNORMAL HIGH (ref 4.0–10.5)
nRBC: 0 % (ref 0.0–0.2)

## 2022-07-29 LAB — COMPREHENSIVE METABOLIC PANEL
ALT: 15 U/L (ref 0–44)
AST: 39 U/L (ref 15–41)
Albumin: 2.2 g/dL — ABNORMAL LOW (ref 3.5–5.0)
Alkaline Phosphatase: 83 U/L (ref 38–126)
Anion gap: 8 (ref 5–15)
BUN: <5 mg/dL — ABNORMAL LOW (ref 6–20)
CO2: 18 mmol/L — ABNORMAL LOW (ref 22–32)
Calcium: 7.8 mg/dL — ABNORMAL LOW (ref 8.9–10.3)
Chloride: 103 mmol/L (ref 98–111)
Creatinine, Ser: 0.74 mg/dL (ref 0.44–1.00)
GFR, Estimated: >60 mL/min (ref >60)
Glucose, Bld: 118 mg/dL — ABNORMAL HIGH (ref 70–99)
Potassium: 3.5 mmol/L (ref 3.5–5.1)
Sodium: 129 mmol/L — ABNORMAL LOW (ref 135–145)
Total Bilirubin: 2.7 mg/dL — ABNORMAL HIGH (ref 0.3–1.2)
Total Protein: 5.5 g/dL — ABNORMAL LOW (ref 6.5–8.1)

## 2022-07-29 LAB — BILIRUBIN, DIRECT: Bilirubin, Direct: 0.9 mg/dL — ABNORMAL HIGH (ref 0.0–0.2)

## 2022-07-29 LAB — PROTIME-INR
INR: 1.3 — ABNORMAL HIGH (ref 0.8–1.2)
Prothrombin Time: 16.7 seconds — ABNORMAL HIGH (ref 11.4–15.2)

## 2022-07-29 MED ORDER — OXYCODONE HCL 5 MG PO TABS
5.0000 mg | ORAL_TABLET | Freq: Once | ORAL | Status: AC
  Administered 2022-07-29: 5 mg via ORAL
  Filled 2022-07-29: qty 1

## 2022-07-29 MED ORDER — AMOXICILLIN-POT CLAVULANATE 875-125 MG PO TABS: 1.0000 | ORAL_TABLET | Freq: Two times a day (BID) | ORAL | 0 refills | Status: AC

## 2022-07-29 MED ORDER — ONDANSETRON 4 MG PO TBDP: 4.0000 mg | ORAL_TABLET | Freq: Two times a day (BID) | ORAL | 1 refills | Status: DC | PRN

## 2022-07-29 MED ORDER — SODIUM CHLORIDE 0.9 % IV BOLUS
1000.0000 mL | Freq: Once | INTRAVENOUS | Status: AC
  Administered 2022-07-29: 1000 mL via INTRAVENOUS

## 2022-07-29 NOTE — MAU Note (Signed)
Megan Fuller is a 37 y.o. at [redacted]w[redacted]d here in MAU reporting: she's has a HA and constant pain in her neck, back, legs, and hands for past 3 days.  Reports took Tylenol last night and some relief noted.  Also reports recent dizziness. Denies VB or LOF.  Endorses +FM. LMP: NA Onset of complaint: 3 days ago Pain score: 9 There were no vitals filed for this visit.   FHT:164 bpm Lab orders placed from triage:   UA

## 2022-07-29 NOTE — Discharge Instructions (Signed)
-   Recomiendo tomar una protena saludable como parte de tus comidas diarias.  - Puede tomar un medicamento para las nuseas segn sea necesario si las nuseas no mejoran con remedios naturales.  - Toma tu vitamina prenatal diariamente  - Haga un seguimiento con los especialistas gastrointestinales segn lo recomendado.  - Intenta beber [employer].  - Intenta evitar el exceso de sal.   English version  - I recommend taking a healthy protein as part of your daily meals  - You can take a nausea medicine as needed if nausea is not improved with natural remedies  - Take your prenatal vitamin daily  - Please follow up with the GI specialists as recommended  - Try to drink lots of water every day  - Try to avoid excessive salt.

## 2022-07-29 NOTE — MAU Provider Note (Signed)
History     [id number]  Arrival date and time: 07/29/22 1211    Chief Complaint  Patient presents with   Headache   HPI Megan Fuller is a 37 y.o. at [redacted]w[redacted]d by early [place] with PMHx notable for liver cirrhosis, who presents for h/ache, generalized body pain, malaise and dizziness for the last 2 days. She reports not feeling good most of the pregnancy, however has felt more unwell in the last few days. Hasn't been drinking much fluids due to nausea and for fear of worsening her leg swelling. Per chart review, she has had an extensive work up for her liver cirrhosis and the etiology is still unclear. Referral sent to GI. No fever or chills,   Vaginal bleeding: No LOF: No Fetal Movement: Yes Contractions: No  O/Positive/-- (04/02 1158)  OB History     Gravida  2   Para  1   Term      Preterm  1   AB      Living  1      SAB      IAB      Ectopic      Multiple  0   Live Births  1           Past Medical History:  Diagnosis Date   Cirrhosis of liver (HCC)    Noted on 12/2021 CT scan. LFTs reassuring. Negative Hepatitis evaluation at [facility] in 2018. Patient had liver issues as a child, biopsy recommended but she her family did not want to do this. No history of alcoholism.   PCOS (polycystic ovarian syndrome)     Past Surgical History:  Procedure Laterality Date   CESAREAN SECTION N/A 12/26/2019   Procedure: CESAREAN SECTION;  Surgeon: [place] Bing, MD;  Location: [facility] LD ORS;  Service: Obstetrics;  Laterality: N/A;    Family History  Problem Relation Age of Onset   Diabetes Mother     Social History   Socioeconomic History   Marital status: Single    Spouse name: Not on file   Number of children: Not on file   Years of education: Not on file   Highest education level: Not on file  Occupational History   Not on file  Tobacco Use   Smoking status: Never   Smokeless tobacco: Never  Vaping Use   Vaping Use: Never used  Substance and Sexual  Activity   Alcohol use: No    Alcohol/week: 0.0 standard drinks of alcohol   Drug use: No   Sexual activity: Yes  Other Topics Concern   Not on file  Social History Narrative   Not on file   Social Determinants of Health   Financial Resource Strain: Not on file  Food Insecurity: Food Insecurity Present (03/01/2020)   Hunger Vital Sign    Worried About Running Out of Food in the Last Year: Sometimes true    Ran Out of Food in the Last Year: Sometimes true  Transportation Needs: No Transportation Needs (03/01/2020)   PRAPARE - Administrator, Civil Service (Medical): No    Lack of Transportation (Non-Medical): No  Physical Activity: Not on file  Stress: Not on file  Social Connections: Not on file  Intimate Partner Violence: Not on file    No Known Allergies  No current facility-administered medications on file prior to encounter.   Current Outpatient Medications on File Prior to Encounter  Medication Sig Dispense Refill   aspirin EC 81 MG tablet  Take 1 tablet (81 mg total) by mouth daily. Start taking when you are [redacted] weeks pregnant for rest of pregnancy for prevention of preeclampsia 300 tablet 2   Prenatal Vit-Fe Fumarate-FA (PRENATAL MULTIVITAMIN) TABS tablet Take 1 tablet by mouth daily at 12 noon.       Review of Systems  Constitutional:  Positive for chills and malaise/fatigue. Negative for fever.  HENT:  Negative for congestion and sinus pain.   Eyes:  Negative for blurred vision.  Respiratory:  Positive for shortness of breath. Negative for cough.   Cardiovascular:  Positive for leg swelling. Negative for chest pain and palpitations.  Gastrointestinal:  Positive for nausea. Negative for abdominal pain and vomiting.  Genitourinary:  Negative for dysuria, frequency and urgency.  Musculoskeletal:  Negative for myalgias.  Skin:  Negative for itching.  Neurological:  Positive for dizziness and headaches.   Pertinent positives and negative per HPI, all others  reviewed and negative  Physical Exam   BP 125/81   Pulse (!) 105   Temp 98.4 F (36.9 C) (Oral)   Resp 18   Ht 5\' 5"  (1.651 m)   Wt 92.2 kg   SpO2 100%   BMI 33.83 kg/m   Patient Vitals for the past 24 hrs:  BP Temp Temp src Pulse Resp SpO2 Height Weight  07/29/22 1620 125/81 -- -- (!) 105 -- -- -- --  07/29/22 1237 126/77 98.4 F (36.9 C) Oral (!) 123 18 100 % -- --  07/29/22 1231 -- -- -- -- -- -- 5\' 5"  (1.651 m) 92.2 kg    Physical Exam Vitals reviewed.  Constitutional:      General: She is not in acute distress.    Appearance: She is well-developed. She is not toxic-appearing.  HENT:     Head: Normocephalic and atraumatic.     Mouth/Throat:     Mouth: Mucous membranes are moist.  Eyes:     Extraocular Movements: Extraocular movements intact.  Cardiovascular:     Rate and Rhythm: Normal rate.  Pulmonary:     Effort: Pulmonary effort is normal. No respiratory distress.     Breath sounds: Normal breath sounds. No stridor. No wheezing.  Abdominal:     Palpations: Abdomen is soft. There is no mass.     Tenderness: There is abdominal tenderness (RUQ). There is no guarding.     Comments: Gravid  Skin:    General: Skin is warm and dry.  Neurological:     Mental Status: She is alert and oriented to person, place, and time.  Psychiatric:        Mood and Affect: Mood normal.        Behavior: Behavior normal.     Bedside Ultrasound Pt informed that the ultrasound is considered a limited abdominal ultrasound and is not intended to be a complete ultrasound exam.  Patient also informed that the ultrasound is not being completed with the intent of assessing for fetal or placental anomalies or any pelvic abnormalities.  Explained that the purpose of today's ultrasound is to assess for   assessment of ascites .  Patient acknowledges the purpose of the exam and the limitations of the study.     My interpretation:  POCUS done to evaluate for ascites.  No ascites noted in  morrison's pouch or in bilateral lower quadrants. Viable fetus noted with normal FHR 140bpm  FHT Baseline: 150 bpm Variability: moderate Accelerations: >2 15 X 15 accels Decelerations: Absent Uterine activity: None  Reactive NST  Labs Results for orders placed or performed during the hospital encounter of 07/29/22 (from the past 24 hour(s))  Urinalysis, Routine w reflex microscopic -Urine, Clean Catch     Status: Abnormal   Collection Time: 07/29/22 12:56 PM  Result Value Ref Range   Color, Urine AMBER (A) YELLOW   APPearance HAZY (A) CLEAR   Specific Gravity, Urine 1.016 1.005 - 1.030   pH 6.0 5.0 - 8.0   Glucose, UA NEGATIVE NEGATIVE mg/dL   Hgb urine dipstick MODERATE (A) NEGATIVE   Bilirubin Urine NEGATIVE NEGATIVE   Ketones, ur 20 (A) NEGATIVE mg/dL   Protein, ur NEGATIVE NEGATIVE mg/dL   Nitrite NEGATIVE NEGATIVE   Leukocytes,Ua SMALL (A) NEGATIVE   RBC / HPF 11-20 0 - 5 RBC/hpf   WBC, UA 21-50 0 - 5 WBC/hpf   Bacteria, UA MANY (A) NONE SEEN   Squamous Epithelial / HPF 11-20 0 - 5 /HPF   Mucus PRESENT   CBC     Status: Abnormal   Collection Time: 07/29/22  1:17 PM  Result Value Ref Range   WBC 11.5 (H) 4.0 - 10.5 K/uL   RBC 2.85 (L) 3.87 - 5.11 MIL/uL   Hemoglobin 10.1 (L) 12.0 - 15.0 g/dL   HCT 16.1 (L) 09.6 - 04.5 %   MCV 100.4 (H) 80.0 - 100.0 fL   MCH 35.4 (H) 26.0 - 34.0 pg   MCHC 35.3 30.0 - 36.0 g/dL   RDW 40.9 81.1 - 91.4 %   Platelets 45 (L) 150 - 400 K/uL   nRBC 0.0 0.0 - 0.2 %  Comprehensive metabolic panel     Status: Abnormal   Collection Time: 07/29/22  1:17 PM  Result Value Ref Range   Sodium 129 (L) 135 - 145 mmol/L   Potassium 3.5 3.5 - 5.1 mmol/L   Chloride 103 98 - 111 mmol/L   CO2 18 (L) 22 - 32 mmol/L   Glucose, Bld 118 (H) 70 - 99 mg/dL   BUN <5 (L) 6 - 20 mg/dL   Creatinine, Ser 7.82 0.44 - 1.00 mg/dL   Calcium 7.8 (L) 8.9 - 10.3 mg/dL   Total Protein 5.5 (L) 6.5 - 8.1 g/dL   Albumin 2.2 (L) 3.5 - 5.0 g/dL   AST 39 15 - 41 U/L    ALT 15 0 - 44 U/L   Alkaline Phosphatase 83 38 - 126 U/L   Total Bilirubin 2.7 (H) 0.3 - 1.2 mg/dL   GFR, Estimated >95 >62 mL/min   Anion gap 8 5 - 15  Wet prep, genital     Status: Abnormal   Collection Time: 07/29/22  1:37 PM   Specimen: Vaginal  Result Value Ref Range   Yeast Wet Prep HPF POC NONE SEEN NONE SEEN   Trich, Wet Prep NONE SEEN NONE SEEN   Clue Cells Wet Prep HPF POC NONE SEEN NONE SEEN   WBC, Wet Prep HPF POC >=10 (A) <10   Sperm NONE SEEN   Protime-INR     Status: Abnormal   Collection Time: 07/29/22  3:32 PM  Result Value Ref Range   Prothrombin Time 16.7 (H) 11.4 - 15.2 seconds   INR 1.3 (H) 0.8 - 1.2  Bilirubin, direct     Status: Abnormal   Collection Time: 07/29/22  3:32 PM  Result Value Ref Range   Bilirubin, Direct 0.9 (H) 0.0 - 0.2 mg/dL    Imaging DG Chest 2 View  Result Date: 07/29/2022 CLINICAL DATA:  Shortness of breath  EXAM: CHEST - 2 VIEW COMPARISON:  09/12/2019 FINDINGS: The heart size and mediastinal contours are within normal limits. Both lungs are clear. The visualized skeletal structures are unremarkable. IMPRESSION: No active cardiopulmonary disease. Electronically Signed   By: Minerva Fester M.D.   On: 07/29/2022 15:07    MAU Course  Procedures Lab Orders         Wet prep, genital         Culture, OB Urine         Urinalysis, Routine w reflex microscopic -Urine, Clean Catch         CBC         Comprehensive metabolic panel         Protime-INR         Bilirubin, direct    Meds ordered this encounter  Medications   sodium chloride 0.9 % bolus 1,000 mL   oxyCODONE (Oxy IR/ROXICODONE) immediate release tablet 5 mg   amoxicillin-clavulanate (AUGMENTIN) 875-125 MG tablet    Sig: Take 1 tablet by mouth 2 (two) times daily for 7 days.    Dispense:  14 tablet    Refill:  0   ondansetron (ZOFRAN-ODT) 4 MG disintegrating tablet    Sig: Take 1 tablet (4 mg total) by mouth 2 (two) times daily as needed for nausea or vomiting. Please don't  take more than 2 tablets a day    Dispense:  30 tablet    Refill:  1   Imaging Orders         DG Chest 2 View     MDM High (Level 5)  Assessment and Plan  #Hyponatremia  Macrocytic anemia during pregnancy  Cirrhosis of liver without ascites, unspecified hepatic cirrhosis type (HCC)  [redacted] weeks gestation of pregnancy  Dehydration  Nausea/vomiting in pregnancy  Acute cystitis with hematuria   37 y.o. G2P0101 at [redacted]w[redacted]d with known liver cirrhosis, here with   No evidence of sepsis noted, no signs of ascites, and no evidence of hepatic decompensation. Platelets are at the lowest at 45, and she has had some nose bleeds, but no major bleeding and no symptoms suggestive of esophageal varices or GI bleeding. I suspect she may have experienced a viral syndrome and given limited fluid intake, developed hyponatremia resulting in the symptoms she presented with today. She feels better following IV fluids.  UA possibly a dirty catch, vs a UTI, given hemoglobin in urine, however, will start empiric antibiotics while awaiting cultures, since patient is high risk.   Discussed lifestyle and dietary changes extensively. Encouraged increased protein intake, reduced salt intake and increased hydration with water.   Encouraged follow up with GI, hematology and MFM as recommended.  #FWB NST: Reactive   Dispo: discharged to home in stable condition  Allergies as of 07/29/2022   No Known Allergies      Medication List     TAKE these medications    amoxicillin-clavulanate 875-125 MG tablet Commonly known as: AUGMENTIN Take 1 tablet by mouth 2 (two) times daily for 7 days.   aspirin EC 81 MG tablet Take 1 tablet (81 mg total) by mouth daily. Start taking when you are [redacted] weeks pregnant for rest of pregnancy for prevention of preeclampsia   ondansetron 4 MG disintegrating tablet Commonly known as: ZOFRAN-ODT Take 1 tablet (4 mg total) by mouth 2 (two) times daily as needed for nausea or  vomiting. Please don't take more than 2 tablets a day   prenatal multivitamin Tabs tablet Take 1 tablet  by mouth daily at 12 noon.        Sheppard Evens MD MPH OB Fellow, Faculty Practice [facility], Center for [facility] 07/29/2022

## 2022-07-30 ENCOUNTER — Ambulatory Visit: Payer: Self-pay | Attending: Cardiology | Admitting: Cardiology

## 2022-08-02 ENCOUNTER — Encounter: Payer: Self-pay | Admitting: Family Medicine

## 2022-08-02 DIAGNOSIS — Z7189 Other specified counseling: Secondary | ICD-10-CM | POA: Insufficient documentation

## 2022-08-09 ENCOUNTER — Ambulatory Visit (INDEPENDENT_AMBULATORY_CARE_PROVIDER_SITE_OTHER): Payer: Self-pay | Admitting: Internal Medicine

## 2022-08-09 ENCOUNTER — Encounter: Payer: Self-pay | Admitting: Internal Medicine

## 2022-08-09 ENCOUNTER — Other Ambulatory Visit (INDEPENDENT_AMBULATORY_CARE_PROVIDER_SITE_OTHER): Payer: Self-pay

## 2022-08-09 VITALS — BP 130/82 | HR 99 | Ht 65.0 in | Wt 200.0 lb

## 2022-08-09 DIAGNOSIS — R161 Splenomegaly, not elsewhere classified: Secondary | ICD-10-CM

## 2022-08-09 DIAGNOSIS — D696 Thrombocytopenia, unspecified: Secondary | ICD-10-CM

## 2022-08-09 DIAGNOSIS — K219 Gastro-esophageal reflux disease without esophagitis: Secondary | ICD-10-CM | POA: Insufficient documentation

## 2022-08-09 DIAGNOSIS — K5909 Other constipation: Secondary | ICD-10-CM

## 2022-08-09 DIAGNOSIS — Z3A27 27 weeks gestation of pregnancy: Secondary | ICD-10-CM

## 2022-08-09 DIAGNOSIS — K7460 Unspecified cirrhosis of liver: Secondary | ICD-10-CM

## 2022-08-09 DIAGNOSIS — Z5989 Other problems related to housing and economic circumstances: Secondary | ICD-10-CM | POA: Insufficient documentation

## 2022-08-09 HISTORY — DX: Gastro-esophageal reflux disease without esophagitis: K21.9

## 2022-08-09 LAB — CBC WITH DIFFERENTIAL/PLATELET
Basophils Absolute: 0 10*3/uL (ref 0.0–0.1)
Basophils Relative: 0.5 % (ref 0.0–3.0)
Eosinophils Absolute: 0.1 10*3/uL (ref 0.0–0.7)
Eosinophils Relative: 1.1 % (ref 0.0–5.0)
HCT: 30.9 % — ABNORMAL LOW (ref 36.0–46.0)
Hemoglobin: 10.8 g/dL — ABNORMAL LOW (ref 12.0–15.0)
Lymphocytes Relative: 13 % (ref 12.0–46.0)
Lymphs Abs: 0.9 10*3/uL (ref 0.7–4.0)
MCHC: 34.8 g/dL (ref 30.0–36.0)
MCV: 100.8 fl — ABNORMAL HIGH (ref 78.0–100.0)
Monocytes Absolute: 0.4 10*3/uL (ref 0.1–1.0)
Monocytes Relative: 5.1 % (ref 3.0–12.0)
Neutro Abs: 5.8 10*3/uL (ref 1.4–7.7)
Neutrophils Relative %: 80.3 % — ABNORMAL HIGH (ref 43.0–77.0)
Platelets: 70 10*3/uL — ABNORMAL LOW (ref 150.0–400.0)
RBC: 3.07 Mil/uL — ABNORMAL LOW (ref 3.87–5.11)
RDW: 13.9 % (ref 11.5–15.5)
WBC: 7.2 10*3/uL (ref 4.0–10.5)

## 2022-08-09 LAB — COMPREHENSIVE METABOLIC PANEL
ALT: 11 U/L (ref 0–35)
AST: 31 U/L (ref 0–37)
Albumin: 2.9 g/dL — ABNORMAL LOW (ref 3.5–5.2)
Alkaline Phosphatase: 102 U/L (ref 39–117)
BUN: 5 mg/dL — ABNORMAL LOW (ref 6–23)
CO2: 24 mEq/L (ref 19–32)
Calcium: 8.2 mg/dL — ABNORMAL LOW (ref 8.4–10.5)
Chloride: 108 mEq/L (ref 96–112)
Creatinine, Ser: 0.57 mg/dL (ref 0.40–1.20)
GFR: 118.34 mL/min (ref >60.00)
Glucose, Bld: 76 mg/dL (ref 70–99)
Potassium: 3.8 mEq/L (ref 3.5–5.1)
Sodium: 138 mEq/L (ref 135–145)
Total Bilirubin: 2.3 mg/dL — ABNORMAL HIGH (ref 0.2–1.2)
Total Protein: 6.3 g/dL (ref 6.0–8.3)

## 2022-08-09 LAB — PROTIME-INR
INR: 1.2 ratio — ABNORMAL HIGH (ref 0.8–1.0)
Prothrombin Time: 12.8 s (ref 9.6–13.1)

## 2022-08-09 MED ORDER — FAMOTIDINE 20 MG PO TABS
20.0000 mg | ORAL_TABLET | Freq: Two times a day (BID) | ORAL | 3 refills | Status: DC
Start: 2022-08-09 — End: 2022-09-16

## 2022-08-09 NOTE — Patient Instructions (Addendum)
Your provider has requested that you go to the basement level for lab work before leaving today. Press "B" on the elevator. The lab is located at the first door on the left as you exit the elevator.  Due to recent changes in healthcare laws, you may see the results of your imaging and laboratory studies on MyChart before your provider has had a chance to review them.  We understand that in some cases there may be results that are confusing or concerning to you. Not all laboratory results come back in the same time frame and the provider may be waiting for multiple results in order to interpret others.  Please give [place] 48 hours in order for your provider to thoroughly review all the results before contacting the office for clarification of your results.   You have been scheduled for an abdominal ultrasound at [facility] (1st floor of hospital) on 08/15/2022 at 8:00AM. Please arrive 15 minutes prior to your appointment for registration. Make certain not to have anything to eat or drink 6 hours prior to your appointment. Should you need to reschedule your appointment, please contact radiology at [phone]. This test typically takes about 30 minutes to perform.   I appreciate the opportunity to care for you. Stan Head, MD, [facility]

## 2022-08-09 NOTE — H&P (View-Only) (Signed)
 Megan Fuller 36 y.o. 01/28/1986 [id number]  Assessment & Plan:   Encounter Diagnoses  Name Primary?   Hepatic cirrhosis, unspecified hepatic cirrhosis type, unspecified whether ascites present (HCC) Yes   Gastroesophageal reflux disease, unspecified whether esophagitis present    Other constipation    Thrombocytopenia (HCC)    [redacted] weeks gestation of pregnancy    Splenomegaly    Uninsured    Cirrhosis of unclear etiology.  My guesstimate today is that she is Child Pugh score B8 points.  MELD-Na 21  Will recalculate the scores pending labs and also [facility]  Certainly at increased risk of complications including possible variceal bleeding during pregnancy and delivery.  I am getting an ultrasound to see if she has ascites and to get other pictures of her liver.  Also to have a better idea if she is truly cirrhotic though I think so.  MRI might be needed.  She asked about the etiology and at this point it is cryptogenic.  History of PCOS which goes along with insulin resistance so could be fatty liver related though she is pretty young making that less likely but not impossible.  After she left I was able to see that she had been on Urso deoxycholic acid in the past which is a treatment for primary biliary cholangitis.  She has had an extensive serologic workup with mildly positive anti-smooth muscle antibody suggesting the possibility of autoimmune process though ANA and LK M-1 antibodies are negative.  She could have PBC I will add on mitochondrial antibodies or get those checked if I cannot do so.  It is best to do an EGD in the second trimester to screen for varices.  I am undecided as to whether we should do that at this point I am not sure it would change management but I will discuss with OB colleagues in hepatology.  Treat GERD symptoms with Pepcid for now. Continue MiraLAX.  Further plans pending the above.  I have explained that once she delivers we could  consider a liver biopsy.  Note that she tells me she understands English or at least some English.  She will most likely benefit from hepatology evaluation and may need to be followed in a clinic with transplant capabilities.  Meds ordered this encounter  Medications   famotidine (PEPCID) 20 MG tablet    Sig: Take 1 tablet (20 mg total) by mouth 2 (two) times daily.    Dispense:  60 tablet    Refill:  3   Orders Placed This Encounter  Procedures   US Abdomen Complete   Comprehensive metabolic panel   CBC with Differential/Platelet   Protime-INR   Mitochondrial Antibodies   Lab Results  Component Value Date   WBC 7.2 08/09/2022   HGB 10.8 (L) 08/09/2022   HCT 30.9 (L) 08/09/2022   MCV 100.8 (H) 08/09/2022   PLT 70.0 (L) 08/09/2022     Chemistry      Component Value Date/Time   NA 138 08/09/2022 1236   NA 138 05/28/2022 1158   NA 141 08/04/2012 1408   K 3.8 08/09/2022 1236   K 4.0 08/04/2012 1408   CL 108 08/09/2022 1236   CL 109 (H) 08/04/2012 1408   CO2 24 08/09/2022 1236   CO2 24 08/04/2012 1408   BUN 5 (L) 08/09/2022 1236   BUN 4 (L) 05/28/2022 1158   BUN 10.5 08/04/2012 1408   CREATININE 0.57 08/09/2022 1236   CREATININE 0.60 06/08/2022 0953     CREATININE 0.9 08/04/2012 1408      Component Value Date/Time   CALCIUM 8.2 (L) 08/09/2022 1236   CALCIUM 9.1 08/04/2012 1408   ALKPHOS 102 08/09/2022 1236   ALKPHOS 135 08/04/2012 1408   AST 31 08/09/2022 1236   AST 34 06/08/2022 0953   AST 52 (H) 08/04/2012 1408   ALT 11 08/09/2022 1236   ALT 16 06/08/2022 0953   ALT 46 08/04/2012 1408   BILITOT 2.3 (H) 08/09/2022 1236   BILITOT 2.0 (H) 06/08/2022 0953   BILITOT 1.57 (H) 08/04/2012 1408     Lab Results  Component Value Date   INR 1.2 (H) 08/09/2022   INR 1.3 (H) 07/29/2022   INR 1.1 06/27/2022   Based upon new labs here her Child's score is class B 7 points  Meld-NA is 13 a significant improvement over previous calculation.  [facility] surgical  perioperative outcome calculator-for abdominal surgery is calculated.  There is no one for pelvic surgery per se.  30-day mortality would be 8.1% 90-day mortality 9.6% 180-day mortality 12.7% 90-day hepatic decompensation risk 29.9%  Discussed with hepatology colleagues.  Plan to do EGD to screen for varices.  Still waiting on ultrasound and mitochondrial antibodies. Subjective:   Chief Complaint: Cirrhosis and pregnancy, also with heartburn and constipation and nausea  HPI 36-year-old Hispanic woman from [place] who presents with an interpreter because of a diagnosis of cirrhosis in the setting of pregnancy.  She is [redacted] weeks pregnant.  She is referred by Dr. Eck stat.  She recalls being told she had some sort of liver issues many years ago when she was a teenager.  There was an ultrasound in the chart from 2010 the indication was thrombocytopenia, she had moderate splenomegaly at the time no liver abnormalities.  There was a 4 mm gallbladder polyp.  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  She was reported to have PCOS, and she also had a mildly elevated prothrombin time at that point. [place] emergency rheum notes from 2015 says she was treated with Ursodeoxycholic acid and  desloratadine.  She delivered a healthy child at 25 weeks in 2021 via low-transverse cesarean section.  She became pregnant again earlier this year.  Saw hematology 06/08/2022.  She has had serologic workup  She is immune to hepatitis A with positive total antibody Hepatitis B and C and ED testing was negative ANA is negative  LKM1 antibody negative Tissue transglutaminase IgA negative Smooth muscle antibody mildly positive at 23 Alpha-1 antitrypsin level slightly high at 198  She was recently briefly hospitalized with hyponatremia.  Prior to that some abdominal pain.  [facility] she has been concern about having recurrent back pain and has  been treated for UTIs.  The most recent admission there was regarding dizziness and she was hyponatremic as reflected below.  She was given a low salt diet.  She has been having nausea and heartburn and has been eating and drinking less.  The patient wants to know why she has her liver problems.  She is read quite a bit about it and feels like she understands what cirrhosis is.  She is also having some constipation but taking MiraLAX.  It is really straining to stool which was or is well treated with the MiraLAX.  She is not taking anything for heartburn or reflux.  She does not drink alcohol nor does she use drugs.     Latest Ref   Rng & Units 08/09/2022   12:36 PM 07/29/2022    1:17 PM 06/08/2022    9:54 AM  CBC  WBC 4.0 - 10.5 K/uL 7.2  11.5    Hemoglobin 12.0 - 15.0 g/dL 10.8  10.1    Hematocrit 36.0 - 46.0 % 30.9  28.6  32.3   Platelets 150.0 - 400.0 K/uL 70.0  45      Lab Results  Component Value Date   CREATININE 0.57 08/09/2022   BUN 5 (L) 08/09/2022   NA 138 08/09/2022   K 3.8 08/09/2022   CL 108 08/09/2022   CO2 24 08/09/2022    Lab Results  Component Value Date   ALT 11 08/09/2022   AST 31 08/09/2022   ALKPHOS 102 08/09/2022   BILITOT 2.3 (H) 08/09/2022      No Known Allergies Current Meds  Medication Sig   aspirin EC 81 MG tablet Take 1 tablet (81 mg total) by mouth daily. Start taking when you are [redacted] weeks pregnant for rest of pregnancy for prevention of preeclampsia   famotidine (PEPCID) 20 MG tablet Take 1 tablet (20 mg total) by mouth 2 (two) times daily.   Prenatal Vit-Fe Fumarate-FA (PRENATAL MULTIVITAMIN) TABS tablet Take 1 tablet by mouth daily at 12 noon.   Past Medical History:  Diagnosis Date   Cirrhosis of liver (HCC)    Noted on 12/2021 CT scan. LFTs reassuring. Negative Hepatitis evaluation at [facility] in 2018. Patient had liver issues as a child, biopsy recommended but she her family did not want to do this. No history of alcoholism.   PCOS  (polycystic ovarian syndrome)    Past Surgical History:  Procedure Laterality Date   CESAREAN SECTION N/A 12/26/2019   Procedure: CESAREAN SECTION;  Surgeon: Pickens, Charlie, MD;  Location: [facility];  Service: Obstetrics;  Laterality: N/A;   Social History   Social History Narrative   Single - 1 son born 2022   From [place]   No alcohol, tobacco, drug use   Sisiste also lives in [place]   family history includes Diabetes in her mother.   Review of Systems As perHPI otherwise negative  Objective:   Physical Exam @BP 130/82   Pulse 99   Ht 5' 5" (1.651 m)   Wt 200 lb (90.7 kg)   BMI 33.28 kg/m @  General:  Well-developed, well-nourished and in no acute distress Eyes:  anicteric. Lungs: Clear to auscultation bilaterally. Heart:   S1S2, no rubs, murmurs, gallops. Abdomen:  Gravid soft, non-tender, no palpable hepatosplenomegaly, hernia, or mass and BS+.   Lymph:  no cervical or supraclavicular adenopathy. Extremities:   Trace ankle and pedal edema, cyanosis or clubbing Skin   Some small spider angiomata upper chest, mild acne on the face Neuro:  A&O x 3.  Psych:  appropriate mood and  Affect.   Data Reviewed: See HPI 

## 2022-08-09 NOTE — Progress Notes (Unsigned)
Megan Fuller Lovo 37 y.o. November 16, 1985 [id number]  Assessment & Plan:   Encounter Diagnoses  Name Primary?   Hepatic cirrhosis, unspecified hepatic cirrhosis type, unspecified whether ascites present (HCC) Yes   Gastroesophageal reflux disease, unspecified whether esophagitis present    Other constipation    Thrombocytopenia (HCC)    [redacted] weeks gestation of pregnancy    Splenomegaly    Uninsured    Cirrhosis of unclear etiology.  My guesstimate today is that she is Child Pugh score B8 points.  MELD-Na 21  Will recalculate the scores pending labs and also [facility]  Certainly at increased risk of complications including possible variceal bleeding during pregnancy and delivery.  I am getting an ultrasound to see if she has ascites and to get other pictures of her liver.  Also to have a better idea if she is truly cirrhotic though I think so.  MRI might be needed.  She asked about the etiology and at this point it is cryptogenic.  History of PCOS which goes along with insulin resistance so could be fatty liver related though she is pretty young making that less likely but not impossible.  After she left I was able to see that she had been on Urso deoxycholic acid in the past which is a treatment for primary biliary cholangitis.  She has had an extensive serologic workup with mildly positive anti-smooth muscle antibody suggesting the possibility of autoimmune process though ANA and LK M-1 antibodies are negative.  She could have PBC I will add on mitochondrial antibodies or get those checked if I cannot do so.  It is best to do an EGD in the second trimester to screen for varices.  I am undecided as to whether we should do that at this point I am not sure it would change management but I will discuss with OB colleagues in hepatology.  Treat GERD symptoms with Pepcid for now. Continue MiraLAX.  Further plans pending the above.  I have explained that once she delivers we could  consider a liver biopsy.  Note that she tells me she understands [place] or at least some [place].  She will most likely benefit from hepatology evaluation and may need to be followed in a clinic with transplant capabilities.  Meds ordered this encounter  Medications   famotidine (PEPCID) 20 MG tablet    Sig: Take 1 tablet (20 mg total) by mouth 2 (two) times daily.    Dispense:  60 tablet    Refill:  3   Orders Placed This Encounter  Procedures   US Abdomen Complete   Comprehensive metabolic panel   CBC with Differential/Platelet   Protime-INR   Mitochondrial Antibodies   Lab Results  Component Value Date   WBC 7.2 08/09/2022   HGB 10.8 (L) 08/09/2022   HCT 30.9 (L) 08/09/2022   MCV 100.8 (H) 08/09/2022   PLT 70.0 (L) 08/09/2022     Chemistry      Component Value Date/Time   NA 138 08/09/2022 1236   NA 138 05/28/2022 1158   NA 141 08/04/2012 1408   K 3.8 08/09/2022 1236   K 4.0 08/04/2012 1408   CL 108 08/09/2022 1236   CL 109 (H) 08/04/2012 1408   CO2 24 08/09/2022 1236   CO2 24 08/04/2012 1408   BUN 5 (L) 08/09/2022 1236   BUN 4 (L) 05/28/2022 1158   BUN 10.5 08/04/2012 1408   CREATININE 0.57 08/09/2022 1236   CREATININE 0.60 06/08/2022 0953  CREATININE 0.9 08/04/2012 1408      Component Value Date/Time   CALCIUM 8.2 (L) 08/09/2022 1236   CALCIUM 9.1 08/04/2012 1408   ALKPHOS 102 08/09/2022 1236   ALKPHOS 135 08/04/2012 1408   AST 31 08/09/2022 1236   AST 34 06/08/2022 0953   AST 52 (H) 08/04/2012 1408   ALT 11 08/09/2022 1236   ALT 16 06/08/2022 0953   ALT 46 08/04/2012 1408   BILITOT 2.3 (H) 08/09/2022 1236   BILITOT 2.0 (H) 06/08/2022 0953   BILITOT 1.57 (H) 08/04/2012 1408     Lab Results  Component Value Date   INR 1.2 (H) 08/09/2022   INR 1.3 (H) 07/29/2022   INR 1.1 06/27/2022   Based upon new labs here her Child's score is class B 7 points  Meld-NA is 13 a significant improvement over previous calculation.  [facility] surgical  perioperative outcome calculator-for abdominal surgery is calculated.  There is no one for pelvic surgery per se.  30-day mortality would be 8.1% 90-day mortality 9.6% 180-day mortality 12.7% 90-day hepatic decompensation risk 29.9%  Discussed with hepatology colleagues.  Plan to do EGD to screen for varices.  Still waiting on ultrasound and mitochondrial antibodies. Subjective:   Chief Complaint: Cirrhosis and pregnancy, also with heartburn and constipation and nausea  HPI 37 year old Hispanic woman from [place] who presents with an interpreter because of a diagnosis of cirrhosis in the setting of pregnancy.  She is [redacted] weeks pregnant.  She is referred by Dr. Nila Nephew stat.  She recalls being told she had some sort of liver issues many years ago when she was a teenager.  There was an ultrasound in the chart from 2010 the indication was thrombocytopenia, she had moderate splenomegaly at the time no liver abnormalities.  There was a 4 mm gallbladder polyp.  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  She was reported to have PCOS, and she also had a mildly elevated prothrombin time at that point. [place] emergency rheum notes from 2015 says she was treated with Ursodeoxycholic acid and  desloratadine.  She delivered a healthy child at 25 weeks in 2021 via low-transverse cesarean section.  She became pregnant again earlier this year.  Saw hematology 06/08/2022.  She has had serologic workup  She is immune to hepatitis A with positive total antibody Hepatitis B and C and ED testing was negative ANA is negative  LKM1 antibody negative Tissue transglutaminase IgA negative Smooth muscle antibody mildly positive at 23 Alpha-1 antitrypsin level slightly high at 198  She was recently briefly hospitalized with hyponatremia.  Prior to that some abdominal pain.  She was seen in the [facility] she has been concern about having recurrent back pain and has  been treated for UTIs.  The most recent admission there was regarding dizziness and she was hyponatremic as reflected below.  She was given a low salt diet.  She has been having nausea and heartburn and has been eating and drinking less.  The patient wants to know why she has her liver problems.  She is read quite a bit about it and feels like she understands what cirrhosis is.  She is also having some constipation but taking MiraLAX.  It is really straining to stool which was or is well treated with the MiraLAX.  She is not taking anything for heartburn or reflux.  She does not drink alcohol nor does she use drugs.     Latest Ref  Rng & Units 08/09/2022   12:36 PM 07/29/2022    1:17 PM 06/08/2022    9:54 AM  CBC  WBC 4.0 - 10.5 K/uL 7.2  11.5    Hemoglobin 12.0 - 15.0 g/dL 41.6  60.6    Hematocrit 36.0 - 46.0 % 30.9  28.6  32.3   Platelets 150.0 - 400.0 K/uL 70.0  45      Lab Results  Component Value Date   CREATININE 0.57 08/09/2022   BUN 5 (L) 08/09/2022   NA 138 08/09/2022   K 3.8 08/09/2022   CL 108 08/09/2022   CO2 24 08/09/2022    Lab Results  Component Value Date   ALT 11 08/09/2022   AST 31 08/09/2022   ALKPHOS 102 08/09/2022   BILITOT 2.3 (H) 08/09/2022      No Known Allergies Current Meds  Medication Sig   aspirin EC 81 MG tablet Take 1 tablet (81 mg total) by mouth daily. Start taking when you are [redacted] weeks pregnant for rest of pregnancy for prevention of preeclampsia   famotidine (PEPCID) 20 MG tablet Take 1 tablet (20 mg total) by mouth 2 (two) times daily.   Prenatal Vit-Fe Fumarate-FA (PRENATAL MULTIVITAMIN) TABS tablet Take 1 tablet by mouth daily at 12 noon.   Past Medical History:  Diagnosis Date   Cirrhosis of liver (HCC)    Noted on 12/2021 CT scan. LFTs reassuring. Negative Hepatitis evaluation at [facility] in 2018. Patient had liver issues as a child, biopsy recommended but she her family did not want to do this. No history of alcoholism.   PCOS  (polycystic ovarian syndrome)    Past Surgical History:  Procedure Laterality Date   CESAREAN SECTION N/A 12/26/2019   Procedure: CESAREAN SECTION;  Surgeon: [place] Bing, MD;  Location: [facility] LD ORS;  Service: Obstetrics;  Laterality: N/A;   Social History   Social History Narrative   Single - 1 son born 2022   From [place]   No alcohol, tobacco, drug use   Sisiste also lives in [place]   family history includes Diabetes in her mother.   Review of Systems As perHPI otherwise negative  Objective:   Physical Exam @BP  130/82   Pulse 99   Ht 5\' 5"  (1.651 m)   Wt 200 lb (90.7 kg)   BMI 33.28 kg/m @  General:  Well-developed, well-nourished and in no acute distress Eyes:  anicteric. Lungs: Clear to auscultation bilaterally. Heart:   S1S2, no rubs, murmurs, gallops. Abdomen:  Gravid soft, non-tender, no palpable hepatosplenomegaly, hernia, or mass and BS+.   Lymph:  no cervical or supraclavicular adenopathy. Extremities:   Trace ankle and pedal edema, cyanosis or clubbing Skin   Some small spider angiomata upper chest, mild acne on the face Neuro:  A&O x 3.  Psych:  appropriate mood and  Affect.   Data Reviewed: See HPI

## 2022-08-15 ENCOUNTER — Other Ambulatory Visit: Payer: Self-pay

## 2022-08-15 ENCOUNTER — Ambulatory Visit (HOSPITAL_COMMUNITY)
Admission: RE | Admit: 2022-08-15 | Discharge: 2022-08-15 | Disposition: A | Discharge status: Home / Self Care | Payer: Self-pay | Source: Ambulatory Visit | Attending: Internal Medicine | Admitting: Internal Medicine

## 2022-08-15 ENCOUNTER — Telehealth: Payer: Self-pay | Admitting: Internal Medicine

## 2022-08-15 DIAGNOSIS — K7460 Unspecified cirrhosis of liver: Secondary | ICD-10-CM

## 2022-08-15 DIAGNOSIS — O0990 Supervision of high risk pregnancy, unspecified, unspecified trimester: Secondary | ICD-10-CM

## 2022-08-15 DIAGNOSIS — R161 Splenomegaly, not elsewhere classified: Secondary | ICD-10-CM

## 2022-08-15 DIAGNOSIS — Z3A28 28 weeks gestation of pregnancy: Secondary | ICD-10-CM

## 2022-08-15 NOTE — Telephone Encounter (Signed)
[place] confirms cirrhosis  Please let her know  She needs:   to schedule an EGD so I can look for varices -dilated veins in the esophagus. Need toknow if present to guide care w/ pregnancy and delivery  Please explain to her (Spanish speaker- understands some English)  Also let her know I have checked w/ OB and they are in agreement w/ plan - she sees them tomorrow  I have availability Thurs 6/27 in AM - limited availability so she needs to try to take this appt  Encounter Diagnoses  Name Primary?   Hepatic cirrhosis, unspecified hepatic cirrhosis type, unspecified whether ascites present (HCC) Yes   [redacted] weeks gestation of pregnancy

## 2022-08-15 NOTE — Telephone Encounter (Signed)
Routing to OB PCP also - she has an appointmenttomorrow

## 2022-08-15 NOTE — Telephone Encounter (Signed)
Spoke with patient regarding MD recommendations using interpreter services. At this time she would prefer to speak with her OB tomorrow at her appointment to discuss further & then I will reach out tomorrow afternoon to discuss if she would like to proceed with scheduling. Pt verbalized all understanding.

## 2022-08-15 NOTE — Telephone Encounter (Signed)
Unable to reach patient using interpreter services. VM box full. Mychart message sent to patient. Will try again at a later time today to reach her.

## 2022-08-16 ENCOUNTER — Encounter: Payer: Self-pay | Admitting: Family Medicine

## 2022-08-16 ENCOUNTER — Other Ambulatory Visit: Payer: Self-pay

## 2022-08-16 ENCOUNTER — Ambulatory Visit (INDEPENDENT_AMBULATORY_CARE_PROVIDER_SITE_OTHER): Payer: Self-pay | Admitting: Family Medicine

## 2022-08-16 VITALS — BP 115/71 | HR 75 | Wt 202.4 lb

## 2022-08-16 DIAGNOSIS — Z7189 Other specified counseling: Secondary | ICD-10-CM

## 2022-08-16 DIAGNOSIS — O0993 Supervision of high risk pregnancy, unspecified, third trimester: Secondary | ICD-10-CM

## 2022-08-16 DIAGNOSIS — Z3A28 28 weeks gestation of pregnancy: Secondary | ICD-10-CM

## 2022-08-16 DIAGNOSIS — O09899 Supervision of other high risk pregnancies, unspecified trimester: Secondary | ICD-10-CM

## 2022-08-16 DIAGNOSIS — K7460 Unspecified cirrhosis of liver: Secondary | ICD-10-CM

## 2022-08-16 DIAGNOSIS — D693 Immune thrombocytopenic purpura: Secondary | ICD-10-CM

## 2022-08-16 DIAGNOSIS — Z98891 History of uterine scar from previous surgery: Secondary | ICD-10-CM

## 2022-08-16 DIAGNOSIS — O09893 Supervision of other high risk pregnancies, third trimester: Secondary | ICD-10-CM

## 2022-08-16 DIAGNOSIS — O0990 Supervision of high risk pregnancy, unspecified, unspecified trimester: Secondary | ICD-10-CM

## 2022-08-16 DIAGNOSIS — Z5989 Other problems related to housing and economic circumstances: Secondary | ICD-10-CM

## 2022-08-16 DIAGNOSIS — R161 Splenomegaly, not elsewhere classified: Secondary | ICD-10-CM

## 2022-08-16 NOTE — Progress Notes (Signed)
Patient stated that she is it still experiencing vaginal irritation even though she has finished Rx that was prescribed "3-4 weeks ago"   Her sx includes dysuria, white discharge and itch. I offered patient to do a self swab and she declined stating "no, because I already know that I have it (infection). The discharge is thick and white and gets all over the place"     Letter for Tdap was given to her for [facility]

## 2022-08-16 NOTE — Progress Notes (Signed)
Subjective:  Megan Fuller is a 37 y.o. G2P0101 at [redacted]w[redacted]d being seen today for ongoing prenatal care.  She is currently monitored for the following issues for this high-risk pregnancy and has PCOS (polycystic ovarian syndrome); E.coli UTI (urinary tract infection) during pregnancy, second trimester; Chronic ITP (idiopathic thrombocytopenia) (HCC); Depression affecting pregnancy in second trimester, antepartum; History of preterm delivery, currently pregnant; History of cesarean section, low transverse; Supervision of high risk pregnancy, antepartum; Obesity in pregnancy, antepartum; Cirrhosis (HCC); Proteinuria affecting pregnancy in second trimester; Splenomegaly; Macrocytic anemia during pregnancy; Red Chart Rounds Patient; Uninsured; Thrombocytopenia (HCC); and Gastroesophageal reflux disease on their problem list.  Patient reports no complaints.  Contractions: Not present.  .  Movement: Present. Denies leaking of fluid.   The following portions of the patient's history were reviewed and updated as appropriate: allergies, current medications, past family history, past medical history, past social history, past surgical history and problem list. Problem list updated.  Objective:   Vitals:   08/16/22 0856  BP: 115/71  Pulse: 75  Weight: 202 lb 6.4 oz (91.8 kg)    Fetal Status: Fetal Heart Rate (bpm): 135   Movement: Present     General:  Alert, oriented and cooperative. Patient is in no acute distress.  Skin: Skin is warm and dry. No rash noted.   Cardiovascular: Normal heart rate noted  Respiratory: Normal respiratory effort, no problems with respiration noted  Abdomen: Soft, gravid, appropriate for gestational age. Pain/Pressure: Absent     Pelvic:   Vag D/C Character: White   Cervical exam deferred        Extremities: Normal range of motion.     Mental Status: Normal mood and affect. Normal behavior. Normal judgment and thought content.   Urinalysis:      Assessment and  Plan:  Pregnancy: G2P0101 at [redacted]w[redacted]d  1. Supervision of high risk pregnancy, antepartum BP and FHR normal 28 wk labs today Given letter for [facility] for TDAP   2. Chronic ITP (idiopathic thrombocytopenia) (HCC) Likely secondary to cirrhosis which is itself of unclear etiology Seen by heme on 06/08/22, unclear if immune mediated, per their note more likely cirrhotic in origin. Basic labs sent which were unremarkable, recommendation against neuraxial anesthesia during delivery No showed follow up visit on 07/16/2022, does not feel it will be helpful based on her last conversation with them Given lack of evidence of primary hematologic process I am in agreement, repeat eval can be deferred until after GI evaluation  3. Cirrhosis of liver without ascites, unspecified hepatic cirrhosis type (HCC) Unclear etiology but has been longstanding on imaging Earliest imaging is Abd [place] from 12/05/2008 which shows splenomegaly but no liver changes Next liver imaging was on CT renal study from 01/19/2022 which showed nodular and cirrhotic liver Reports that at age 5 she was told she had her liver "inflamed" and she needed a biopsy, but her family did not agree to this She denies alcohol use, ferritin was normal on recent hematology labs, TSH was normal recently, and hep B and C were negative on new OB labs  At last visit labs sent for Hep A Ab, ANA, anti-smooth muschle, anti microsomal liver/kidney Ab, Hep E Ab, CK, ceruloplasmin, alpha 1 antitrypsin, and tissue transglutaminase  Only abnormal lab was anti-microsomal Ab-Liver/Kidney  Seen by Dr. Leone Payor on 08/09/2022 Additional anti-mitochondrial Ab sent and are still pending, abdominal US obtained which confirmed cirrhotic morphology is still present as well as splenomegaly He recommends and we are in  agreement with EGD to screen for varices, discussed rationale for this and after we talked patient agreed to undergo procedure Very worried about finances, will  message GI office to let them know the plan  4. History of cesarean section, low transverse Vertical skin but LTCS on uterus Does not want to [facility], also desires BTL Discussed in detail today, would like RCS+bilateral salpingectomy Understands that due to thrombocytopenia will need to have general anesthesia Given clinical situation have been coordinating with Dr. Armond Hang from anesthesia for surgical planning Based on platelets and remainder of her clinical status we will create formal plan closer to delivery but will likely need platelet transfusions and we will plan for ligasure for BTL  5. History of preterm delivery, currently pregnant At [redacted] weeks due to PPROM  6. Splenomegaly Secondary to cirrhosis  7. Red Chart Rounds Patient   8. Uninsured   Preterm labor symptoms and general obstetric precautions including but not limited to vaginal bleeding, contractions, leaking of fluid and fetal movement were reviewed in detail with the patient. Please refer to After Visit Summary for other counseling recommendations.  Return in 2 weeks (on 08/30/2022) for [facility], ob visit, needs MD.   Venora Maples, MD

## 2022-08-16 NOTE — Telephone Encounter (Signed)
Pt asked yesterday that I give her a call back in the afternoon to discuss scheduling. Unable to reach patient, left voicemail using interpreter services for patient to call back. Will also try again at a later time.

## 2022-08-16 NOTE — Patient Instructions (Signed)
Eleccin del mtodo anticonceptivo Contraception Choices La anticoncepcin, o los mtodos anticonceptivos, hace referencia a los mtodos o dispositivos que evitan el embarazo. Mtodos hormonales  Implante anticonceptivo Un implante anticonceptivo consiste en un tubo delgado de plstico que contiene una hormona que evita el embarazo. Es diferente de un dispositivo intrauterino (DIU). Un mdico lo inserta en la parte superior del brazo. Los implantes pueden ser eficaces durante un mximo de 3 aos. Inyecciones de progestina sola Las inyecciones de progestina sola contienen progestina, una forma sinttica de la hormona progesterona. Un mdico las administra cada 3 meses. Pldoras anticonceptivas Las pldoras anticonceptivas son pastillas que contienen hormonas que evitan el embarazo. Deben tomarse una vez al da, preferentemente a la misma hora cada da. Se necesita una receta para utilizar este mtodo anticonceptivo. Parche anticonceptivo El parche anticonceptivo contiene hormonas que evitan el embarazo. Se coloca en la piel, debe cambiarse una vez a la semana durante tres semanas y debe retirarse en la cuarta semana. Se necesita una receta para utilizar este mtodo anticonceptivo. Anillo vaginal Un anillo vaginal contiene hormonas que evitan el embarazo. Se coloca en la vagina durante tres semanas y se retira en la cuarta semana. Luego se repite el proceso con un anillo nuevo. Se necesita una receta para utilizar este mtodo anticonceptivo. Anticonceptivo de emergencia Los anticonceptivos de emergencia son mtodos para evitar un embarazo despus de tener sexo sin proteccin. Vienen en forma de pldora y pueden tomarse hasta 5 das despus de tener sexo. Funcionan mejor cuando se toman lo ms pronto posible luego de tener sexo. La mayora de los anticonceptivos de emergencia estn disponibles sin receta mdica. Este mtodo no debe utilizarse como el nico mtodo anticonceptivo. Mtodos de  barrera  Condn masculino Un condn masculino es una vaina delgada que se coloca sobre el pene durante el sexo. Los condones evitan que el esperma ingrese en el cuerpo de la mujer. Pueden utilizarse con un una sustancia que mata a los espermatozoides (espermicida) para aumentar la efectividad. Deben desecharse despus de un uso. Condn femenino Un condn femenino es una vaina blanda y holgada que se coloca en la vagina antes de tener sexo. El condn evita que el esperma ingrese en el cuerpo de la mujer. Deben desecharse despus de un uso. Diafragma Un diafragma es una barrera blanda con forma de cpula. Se inserta en la vagina antes del sexo, junto con un espermicida. El diafragma bloquea el ingreso de esperma en el tero, y el espermicida mata a los espermatozoides. El diafragma debe permanecer en la vagina durante 6 a 8 horas despus de tener sexo y debe retirarse en el plazo de las 24 horas. Un diafragma es recetado y colocado por un mdico. Debe reemplazarse cada 1 a 2 aos, despus de dar a luz, de aumentar ms de 15lb (6.8kg) y de una ciruga plvica. Capuchn cervical Un capuchn cervical es una copa redonda y blanda de ltex o plstico que se coloca en el cuello uterino. Se inserta en la vagina antes del sexo, junto con un espermicida. Bloquea el ingreso del esperma en el tero. El capuchn debe permanecer en el lugar durante 6 a 8 horas despus de tener sexo y debe retirarse en el plazo de las 48 horas. Un capuchn cervical debe ser recetado y colocado por un mdico. Debe reemplazarse cada 2aos. Esponja Una esponja es una pieza blanda y circular de espuma de poliuretano que contiene espermicida. La esponja ayuda a bloquear el ingreso de esperma en el tero, y el espermicida   mata a los espermatozoides. Para utilizarla, debe humedecerla e insertarla en la vagina. Debe insertarse antes de tener sexo, debe permanecer dentro al menos durante 6 horas despus de tener sexo y debe retirarse y  desecharse en el plazo de las 30 horas. Espermicidas Los espermicidas son sustancias qumicas que matan o bloquean al esperma y no lo dejan ingresar al cuello uterino y al tero. Vienen en forma de crema, gel, supositorio, espuma o comprimido. Un espermicida debe insertarse en la vagina con un aplicador al menos 10 o 15 minutos antes de tener sexo para dar tiempo a que surta efecto. El proceso debe repetirse cada vez que tenga sexo. Los espermicidas no requieren receta mdica. Anticonceptivos intrauterinos Dispositivo intrauterino (DIU) Un DIU es un dispositivo en forma de T que se coloca en el tero. Existen dos tipos: DIU hormonal.Este tipo contiene progestina, una forma sinttica de la hormona progesterona. Este tipo puede permanecer colocado durante 3 a 5 aos. DIU de cobre.Este tipo est recubierto con un alambre de cobre. Puede permanecer colocado durante 10 aos. Mtodos anticonceptivos permanentes Ligadura de trompas en la mujer En este mtodo, se sellan, atan u obstruyen las trompas de Falopio durante una ciruga para evitar que el vulo descienda hacia el tero. Esterilizacin histeroscpica En este mtodo, se coloca un implante pequeo y flexible dentro de cada trompa de Falopio. Los implantes hacen que se forme un tejido cicatricial en las trompas de Falopio y que las obstruya para que el espermatozoide no pueda llegar al vulo. El procedimiento demora alrededor de 3 meses para que sea efectivo. Debe utilizarse otro mtodo anticonceptivo durante esos 3 meses. Esterilizacin masculina Este es un procedimiento que consiste en atar los conductos que transportan el esperma (vasectoma). Luego del procedimiento, el hombre puede eyacular lquido (semen). Debe utilizarse otro mtodo anticonceptivo durante 3 meses despus del procedimiento. Mtodos de planificacin natural Planificacin familiar natural En este mtodo, la pareja no tiene sexo durante los das en que la mujer podra quedar  embarazada. Mtodo calendario En este mtodo, la mujer realiza un seguimiento de la duracin de cada ciclo menstrual, identifica los das en los que se puede producir un embarazo y no tiene sexo durante esos das. Mtodo de la ovulacin En este mtodo, la pareja evita tener sexo durante la ovulacin. Mtodo sintotrmico Este mtodo implica no tener sexo durante la ovulacin. Normalmente, la mujer comprueba la ovulacin al observar cambios en su temperatura y en la consistencia del moco cervical. Mtodo posovulacin En este mtodo, la pareja espera a que finalice la ovulacin para tener sexo. Dnde buscar ms informacin Centers for Disease Control and Prevention (Centros para el Control y la Prevencin de Enfermedades): www.cdc.gov Resumen La anticoncepcin, o los mtodos anticonceptivos, hace referencia a los mtodos o dispositivos que evitan el embarazo. Los mtodos anticonceptivos hormonales incluyen implantes, inyecciones, pastillas, parches, anillos vaginales y anticonceptivos de emergencia. Los mtodos anticonceptivos de barrera pueden incluir condones masculinos, condones femeninos, diafragmas, capuchones cervicales, esponjas y espermicidas. Existen dos tipos de DIU (dispositivo intrauterino). Un DIU puede colocarse en el tero de una mujer para evitar el embarazo durante 3 a 5 aos. La esterilizacin permanente puede realizarse mediante un procedimiento tanto en los hombres como en las mujeres. Los mtodos de planificacin familiar natural implican no tener sexo durante los das en que la mujer podra quedar embarazada. Esta informacin no tiene como fin reemplazar el consejo del mdico. Asegrese de hacerle al mdico cualquier pregunta que tenga. Document Revised: 09/14/2019 Document Reviewed: 09/14/2019 Elsevier Patient Education    2024 Elsevier Inc.  

## 2022-08-16 NOTE — Telephone Encounter (Signed)
Spoke with patient with interpreter services & she would like to proceed with scheduling EGD next week. Will have Viviann Spare, RN schedule patient first thing Monday morning since scheduling is currently closed for today. She says she does not use mychart & is unable to come by office to pick up instructions. Patty, CMA faxed copy of instructions to patient's email & she has been advised to call back with any questions. She did mention that she is uninsured & that she is currently working to get patient assistance, but unsure when it will be completed. Advised her that per pre-cert coordinator she will not be billed for 2-3 weeks & that she will receive a letter regarding estimates. Instructions were also discussed on the phone. No blood thinners or diabetic per pt. Amb ref placed. Pt verbalized all understanding.

## 2022-08-18 LAB — MITOCHONDRIAL ANTIBODIES: Mitochondrial M2 Ab, IgG: <=20 U (ref <=20.0)

## 2022-08-19 ENCOUNTER — Other Ambulatory Visit: Payer: Self-pay

## 2022-08-19 LAB — CBC
Hematocrit: 28.8 % — ABNORMAL LOW (ref 34.0–46.6)
Hemoglobin: 9.9 g/dL — ABNORMAL LOW (ref 11.1–15.9)
MCH: 34.4 pg — ABNORMAL HIGH (ref 26.6–33.0)
MCHC: 34.4 g/dL (ref 31.5–35.7)
MCV: 100 fL — ABNORMAL HIGH (ref 79–97)
Platelets: 59 10*3/uL — CL (ref 150–450)
RBC: 2.88 x10E6/uL — ABNORMAL LOW (ref 3.77–5.28)
RDW: 13.2 % (ref 11.7–15.4)
WBC: 5.1 10*3/uL (ref 3.4–10.8)

## 2022-08-19 LAB — HIV ANTIBODY (ROUTINE TESTING W REFLEX): HIV Screen 4th Generation wRfx: NONREACTIVE

## 2022-08-19 LAB — GLUCOSE TOLERANCE, 2 HOURS W/ 1HR
Glucose, 1 hour: 137 mg/dL (ref 70–179)
Glucose, 2 hour: 104 mg/dL (ref 70–152)
Glucose, Fasting: 70 mg/dL (ref 70–91)

## 2022-08-19 LAB — RPR: RPR Ser Ql: NONREACTIVE

## 2022-08-19 NOTE — Telephone Encounter (Signed)
Scheduling was contacted and pt was scheduled for an EGD on 08/22/2022 at 7:30 AM  at [facility] with Dr. Leone Payor. Pt was made aware last week and instructions were sent to pt last week.

## 2022-08-20 ENCOUNTER — Encounter: Payer: Self-pay | Admitting: *Deleted

## 2022-08-21 NOTE — Anesthesia Preprocedure Evaluation (Signed)
Anesthesia Evaluation  Patient identified by MRN, date of birth, ID band Patient awake    Reviewed: Allergy & Precautions, NPO status , Patient's Chart, lab work & pertinent test results  Airway Mallampati: II  TM Distance: >3 FB Neck ROM: Full    Dental no notable dental hx. (+) Teeth Intact, Dental Advisory Given   Pulmonary    Pulmonary exam normal breath sounds clear to auscultation       Cardiovascular Normal cardiovascular exam Rhythm:Regular Rate:Normal     Neuro/Psych  PSYCHIATRIC DISORDERS  Depression       GI/Hepatic ,,,(+) Cirrhosis   Esophageal Varices      Endo/Other    Renal/GU      Musculoskeletal   Abdominal   Peds  Hematology  (+) Blood dyscrasia, anemia Chronic ITP Lab Results      Component                Value               Date                      WBC                      5.1                 08/16/2022                HGB                      9.9 (L)             08/16/2022                HCT                      28.8 (L)            08/16/2022                MCV                      100 (H)             08/16/2022                PLT                      59 (LL)             08/16/2022              Anesthesia Other Findings   Reproductive/Obstetrics (+) Pregnancy PCOS G2P0101 at 29w 1 d                              Anesthesia Physical Anesthesia Plan  ASA: 2  Anesthesia Plan: MAC   Post-op Pain Management:    Induction: Intravenous  PONV Risk Score and Plan: Propofol infusion, Treatment may vary due to age or medical condition and Ondansetron  Airway Management Planned: Nasal Cannula and Natural Airway  Additional Equipment: None  Intra-op Plan:   Post-operative Plan:   Informed Consent: I have reviewed the patients History and Physical, chart, labs and discussed the procedure including the risks, benefits and alternatives for the proposed anesthesia  with the patient or authorized representative who has indicated his/her  understanding and acceptance.     Interpreter used for [employer] Discussed with:   Anesthesia Plan Comments: (FHT monitoring complete.  No reflux today will proceede w mac.  )        Anesthesia Quick Evaluation

## 2022-08-22 ENCOUNTER — Ambulatory Visit (HOSPITAL_BASED_OUTPATIENT_CLINIC_OR_DEPARTMENT_OTHER): Payer: Self-pay | Admitting: Anesthesiology

## 2022-08-22 ENCOUNTER — Ambulatory Visit (HOSPITAL_COMMUNITY): Payer: Self-pay | Admitting: Anesthesiology

## 2022-08-22 ENCOUNTER — Encounter (HOSPITAL_COMMUNITY): Payer: Self-pay | Admitting: Internal Medicine

## 2022-08-22 ENCOUNTER — Ambulatory Visit (HOSPITAL_COMMUNITY)
Admission: RE | Admit: 2022-08-22 | Discharge: 2022-08-22 | Disposition: A | Discharge status: Home / Self Care | Payer: Self-pay | Attending: Internal Medicine | Admitting: Internal Medicine

## 2022-08-22 ENCOUNTER — Other Ambulatory Visit: Payer: Self-pay

## 2022-08-22 ENCOUNTER — Encounter (HOSPITAL_COMMUNITY)
Admission: RE | Disposition: A | Discharge status: Home / Self Care | Payer: Self-pay | Source: Home / Self Care | Attending: Internal Medicine

## 2022-08-22 DIAGNOSIS — K7460 Unspecified cirrhosis of liver: Secondary | ICD-10-CM | POA: Insufficient documentation

## 2022-08-22 DIAGNOSIS — O99612 Diseases of the digestive system complicating pregnancy, second trimester: Secondary | ICD-10-CM | POA: Insufficient documentation

## 2022-08-22 DIAGNOSIS — O99112 Other diseases of the blood and blood-forming organs and certain disorders involving the immune mechanism complicating pregnancy, second trimester: Secondary | ICD-10-CM | POA: Insufficient documentation

## 2022-08-22 DIAGNOSIS — D693 Immune thrombocytopenic purpura: Secondary | ICD-10-CM

## 2022-08-22 DIAGNOSIS — K5909 Other constipation: Secondary | ICD-10-CM | POA: Insufficient documentation

## 2022-08-22 DIAGNOSIS — I8510 Secondary esophageal varices without bleeding: Secondary | ICD-10-CM

## 2022-08-22 DIAGNOSIS — Z79899 Other long term (current) drug therapy: Secondary | ICD-10-CM | POA: Insufficient documentation

## 2022-08-22 DIAGNOSIS — Z3A27 27 weeks gestation of pregnancy: Secondary | ICD-10-CM | POA: Insufficient documentation

## 2022-08-22 HISTORY — PX: ESOPHAGOGASTRODUODENOSCOPY: SHX5428

## 2022-08-22 SURGERY — ESOPHAGOGASTRODUODENOSCOPY (EGD) WITH PROPOFOL
Anesthesia: Monitor Anesthesia Care

## 2022-08-22 SURGERY — EGD (ESOPHAGOGASTRODUODENOSCOPY)
Anesthesia: Monitor Anesthesia Care

## 2022-08-22 MED ORDER — PROPOFOL 500 MG/50ML IV EMUL
INTRAVENOUS | Status: AC
  Filled 2022-08-22: qty 50

## 2022-08-22 MED ORDER — ONDANSETRON HCL 4 MG/2ML IJ SOLN
INTRAMUSCULAR | Status: DC | PRN
  Administered 2022-08-22: 4 mg via INTRAVENOUS

## 2022-08-22 MED ORDER — LACTATED RINGERS IV SOLN: INTRAVENOUS | Status: DC

## 2022-08-22 MED ORDER — PROPOFOL 10 MG/ML IV BOLUS
INTRAVENOUS | Status: DC | PRN
  Administered 2022-08-22: 20 mg via INTRAVENOUS
  Administered 2022-08-22: 30 mg via INTRAVENOUS
  Administered 2022-08-22: 20 mg via INTRAVENOUS

## 2022-08-22 MED ORDER — SODIUM CHLORIDE 0.9 % IV SOLN: INTRAVENOUS | Status: DC

## 2022-08-22 MED ORDER — PROPOFOL 500 MG/50ML IV EMUL
INTRAVENOUS | Status: DC | PRN
  Administered 2022-08-22: 125 ug/kg/min via INTRAVENOUS

## 2022-08-22 MED ORDER — PROPOFOL 10 MG/ML IV BOLUS
INTRAVENOUS | Status: AC
  Filled 2022-08-22: qty 20

## 2022-08-22 NOTE — Anesthesia Procedure Notes (Signed)
Procedure Name: MAC Date/Time: 08/22/2022 7:47 AM  Performed by: Elyn Peers, CRNAPre-anesthesia Checklist: Patient identified, Emergency Drugs available, Suction available, Patient being monitored and Timeout performed Oxygen Delivery Method: Simple face mask Preoxygenation: POM Used. Placement Confirmation: positive ETCO2

## 2022-08-22 NOTE — Progress Notes (Signed)
FHR 154-162 via doppler.  Patient pre procedure in [facility].

## 2022-08-22 NOTE — Progress Notes (Signed)
RROB presents to [facility] for NST on pt post EGD under MAC.  Pt is G2P1 at 29w1 day.  Through Spanish interpreter [phone], pt denies any OB complications. She also has no questions at this time.  FHR is reactive and reassuring for gestational age.  Dr Shawnie Pons made aware.  MD says pt may be OB cleared at this time.

## 2022-08-22 NOTE — Discharge Instructions (Signed)
El examen fue periodo normal   Sin varices ni problemas del higalo  Continue viendo al obstetra y concertare un seguimiento comingo para mas tarde  Iva Boop, MD, FACG  YOU HAD AN ENDOSCOPIC PROCEDURE TODAY: Refer to the procedure report and other information in the discharge instructions given to you for any specific questions about what was found during the examination. If this information does not answer your questions, please call Dr. Marvell Fuller office at [phone] to clarify.   YOU SHOULD EXPECT: Some feelings of bloating in the abdomen. Passage of more gas than usual. Walking can help get rid of the air that was put into your GI tract during the procedure and reduce the bloating. If you had a lower endoscopy (such as a colonoscopy or flexible sigmoidoscopy) you may notice spotting of blood in your stool or on the toilet paper. Some abdominal soreness may be present for a day or two, also.  DIET: Your first meal following the procedure should be a light meal and then it is ok to progress to your normal diet. A half-sandwich or bowl of soup is an example of a good first meal. Heavy or fried foods are harder to digest and may make you feel nauseous or bloated. Drink plenty of fluids but you should avoid alcoholic beverages for 24 hours.   ACTIVITY: Your care partner should take you home directly after the procedure. You should plan to take it easy, moving slowly for the rest of the day. You can resume normal activity the day after the procedure however YOU SHOULD NOT DRIVE, use power tools, machinery or perform tasks that involve climbing or major physical exertion for 24 hours (because of the sedation medicines used during the test).   SYMPTOMS TO REPORT IMMEDIATELY: A gastroenterologist can be reached at any hour. Please call [phone]  for any of the following symptoms:   Following upper endoscopy (EGD, EUS, ERCP, esophageal dilation) Vomiting of blood or coffee ground material   New, significant abdominal pain  New, significant chest pain or pain under the shoulder blades  Painful or persistently difficult swallowing  New shortness of breath  Black, tarry-looking or red, bloody stools

## 2022-08-22 NOTE — Anesthesia Postprocedure Evaluation (Signed)
Anesthesia Post Note  Patient: Megan Fuller  Procedure(s) Performed: ESOPHAGOGASTRODUODENOSCOPY (EGD)     Patient location during evaluation: Endoscopy Anesthesia Type: MAC Level of consciousness: awake and alert Pain management: pain level controlled Vital Signs Assessment: post-procedure vital signs reviewed and stable Respiratory status: spontaneous breathing, nonlabored ventilation, respiratory function stable and patient connected to nasal cannula oxygen Cardiovascular status: blood pressure returned to baseline and stable Postop Assessment: no apparent nausea or vomiting Anesthetic complications: no   No notable events documented.  Last Vitals:  Vitals:   08/22/22 0810 08/22/22 0820  BP: 127/78 130/75  Pulse: 92 84  Resp: 20 (!) 25  Temp:    SpO2: 100% 100%    Last Pain:  Vitals:   08/22/22 0820  TempSrc:   PainSc: 0-No pain                 Trevor Iha

## 2022-08-22 NOTE — Interval H&P Note (Signed)
History and Physical Interval Note:  08/22/2022 7:39 AM  Megan Fuller  has presented today for surgery, with the diagnosis of varices hepatic cirrohsis.  The various methods of treatment have been discussed with the patient and family. After consideration of risks, benefits and other options for treatment, the patient has consented to  Procedure(s): ESOPHAGOGASTRODUODENOSCOPY (EGD) (N/A) as a surgical intervention.  The patient's history has been reviewed, patient examined, no change in status, stable for surgery.  I have reviewed the patient's chart and labs.  Questions were answered to the patient's satisfaction.     Stan Head

## 2022-08-22 NOTE — Transfer of Care (Signed)
Immediate Anesthesia Transfer of Care Note  Patient: Megan Fuller  Procedure(s) Performed: ESOPHAGOGASTRODUODENOSCOPY (EGD)  Patient Location: PACU and Endoscopy Unit  Anesthesia Type:MAC  Level of Consciousness: awake, drowsy, and responds to stimulation  Airway & Oxygen Therapy: Patient Spontanous Breathing and Patient connected to face mask oxygen  Post-op Assessment: Report given to RN and Post -op Vital signs reviewed and stable  Post vital signs: Reviewed and stable  Last Vitals:  Vitals Value Taken Time  BP 109/69 08/22/22 0758  Temp    Pulse 93 08/22/22 0759  Resp 26 08/22/22 0759  SpO2 100 % 08/22/22 0759  Vitals shown include unvalidated device data.  Last Pain:  Vitals:   08/22/22 0654  TempSrc: Temporal  PainSc: 7          Complications: No notable events documented.

## 2022-08-22 NOTE — Op Note (Signed)
[facility] Patient Name: Megan Fuller [facility] Procedure Date: 08/22/2022 MRN: [id number] Attending MD: Iva Boop , MD, [id number] Date of Birth: [birth year] CSN: [id number] Age: 37 Admit Type: Outpatient Procedure:                Upper GI endoscopy Indications:              Cirrhosis rule out esophageal varices Providers:                Iva Boop, MD, Adin Hector, RN, Marge Duncans, RN, Marja Kays, Technician Referring MD:              Medicines:                Monitored Anesthesia Care Complications:            No immediate complications. Estimated Blood Loss:     Estimated blood loss: none. Procedure:                Pre-Anesthesia Assessment:                           - Prior to the procedure, a History and Physical                            was performed, and patient medications and                            allergies were reviewed. The patient's tolerance of                            previous anesthesia was also reviewed. The risks                            and benefits of the procedure and the sedation                            options and risks were discussed with the patient.                            All questions were answered, and informed consent                            was obtained. Prior Anticoagulants: The patient has                            taken no anticoagulant or antiplatelet agents. ASA                            Grade Assessment: II - A patient with mild systemic                            disease. After reviewing the risks and benefits,  the patient was deemed in satisfactory condition to                            undergo the procedure.                           After obtaining informed consent, the endoscope was                            passed under direct vision. Throughout the                            procedure, the patient's blood pressure, pulse, and                             oxygen saturations were monitored continuously. The                            [id number]-H190 ([id number]) Olympus endoscope was introduced                            through the mouth, and advanced to the second part                            of duodenum. The upper GI endoscopy was                            accomplished without difficulty. The patient                            tolerated the procedure well. Scope In: Scope Out: Findings:      The esophagus was normal.      The stomach was normal.      The examined duodenum was normal.      The cardia and gastric fundus were normal on retroflexion. Impression:               - Normal esophagus.                           - Normal stomach.                           - Normal examined duodenum.                           - No specimens collected. Moderate Sedation:      Not Applicable - Patient had care per Anesthesia. Recommendation:           - Patient has a contact number available for                            emergencies. The signs and symptoms of potential                            delayed complications were discussed with the  patient. Return to normal activities tomorrow.                            Written discharge instructions were provided to the                            patient.                           - Resume previous diet.                           - Continue present medications.                           - Once she delivers will plan to look into cause of                            cirrhosis further - at this point it is                            cryptogenic. would consider MRI and/or liver biopsy.                           No signs of portal hypertension seen here. Procedure Code(s):        --- Professional ---                           [phone], Esophagogastroduodenoscopy, flexible,                            transoral; diagnostic, including collection of                             specimen(s) by brushing or washing, when performed                            (separate procedure) Diagnosis Code(s):        --- Professional ---                           K74.60, Unspecified cirrhosis of liver CPT copyright 2022 [employer] Association. All rights reserved. The codes documented in this report are preliminary and upon coder review may  be revised to meet current compliance requirements. Iva Boop, MD 08/22/2022 8:05:21 AM This report has been signed electronically. Number of Addenda: 0

## 2022-08-23 ENCOUNTER — Ambulatory Visit: Payer: No Typology Code available for payment source

## 2022-08-23 ENCOUNTER — Other Ambulatory Visit: Payer: Self-pay

## 2022-08-25 ENCOUNTER — Encounter (HOSPITAL_COMMUNITY): Payer: Self-pay | Admitting: Internal Medicine

## 2022-08-30 ENCOUNTER — Encounter: Payer: Self-pay | Admitting: Family Medicine

## 2022-09-05 ENCOUNTER — Ambulatory Visit: Payer: Self-pay | Admitting: Nurse Practitioner

## 2022-09-09 ENCOUNTER — Other Ambulatory Visit: Payer: Self-pay

## 2022-09-09 ENCOUNTER — Other Ambulatory Visit (HOSPITAL_COMMUNITY)
Admission: RE | Admit: 2022-09-09 | Discharge: 2022-09-09 | Disposition: A | Discharge status: Home / Self Care | Payer: Self-pay | Source: Ambulatory Visit | Attending: Family Medicine | Admitting: Family Medicine

## 2022-09-09 ENCOUNTER — Encounter: Payer: Self-pay | Admitting: Obstetrics and Gynecology

## 2022-09-09 ENCOUNTER — Ambulatory Visit (INDEPENDENT_AMBULATORY_CARE_PROVIDER_SITE_OTHER): Payer: Self-pay | Admitting: Obstetrics and Gynecology

## 2022-09-09 VITALS — BP 122/79 | HR 66 | Wt 209.0 lb

## 2022-09-09 DIAGNOSIS — K7460 Unspecified cirrhosis of liver: Secondary | ICD-10-CM

## 2022-09-09 DIAGNOSIS — D693 Immune thrombocytopenic purpura: Secondary | ICD-10-CM

## 2022-09-09 DIAGNOSIS — R161 Splenomegaly, not elsewhere classified: Secondary | ICD-10-CM

## 2022-09-09 DIAGNOSIS — O09899 Supervision of other high risk pregnancies, unspecified trimester: Secondary | ICD-10-CM

## 2022-09-09 DIAGNOSIS — Z603 Acculturation difficulty: Secondary | ICD-10-CM

## 2022-09-09 DIAGNOSIS — Z98891 History of uterine scar from previous surgery: Secondary | ICD-10-CM

## 2022-09-09 DIAGNOSIS — O0990 Supervision of high risk pregnancy, unspecified, unspecified trimester: Secondary | ICD-10-CM

## 2022-09-09 DIAGNOSIS — Z758 Other problems related to medical facilities and other health care: Secondary | ICD-10-CM

## 2022-09-09 DIAGNOSIS — N898 Other specified noninflammatory disorders of vagina: Secondary | ICD-10-CM | POA: Insufficient documentation

## 2022-09-09 DIAGNOSIS — Z5989 Other problems related to housing and economic circumstances: Secondary | ICD-10-CM

## 2022-09-09 NOTE — Progress Notes (Signed)
PRENATAL VISIT NOTE  Subjective:  Megan Fuller is a 37 y.o. G2P0101 at [redacted]w[redacted]d being seen today for ongoing prenatal care.  She is currently monitored for the following issues for this high-risk pregnancy and has PCOS (polycystic ovarian syndrome); Chronic ITP (idiopathic thrombocytopenia) (HCC); Depression affecting pregnancy in second trimester, antepartum; History of preterm delivery, currently pregnant; History of cesarean section, low transverse; Supervision of high risk pregnancy, antepartum; Obesity in pregnancy, antepartum; Cirrhosis (HCC); Proteinuria affecting pregnancy in second trimester; Splenomegaly; Macrocytic anemia during pregnancy; Red Chart Rounds Patient; Uninsured; and Thrombocytopenia (HCC) on their problem list.  Patient reports  vaginal discharge, reports white clumpy discharge with an odor and irritation.  Denies leaking of fluid, contraction pain   The following portions of the patient's history were reviewed and updated as appropriate: allergies, current medications, past family history, past medical history, past social history, past surgical history and problem list.   Objective:   Vitals:   09/09/22 0851  BP: 122/79  Pulse: 66  Weight: 209 lb (94.8 kg)    Fetal Status: Fetal Heart Rate (bpm): 138   Movement: Present     General:  Alert, oriented and cooperative. Patient is in no acute distress.  Skin: Skin is warm and dry. No rash noted.   Cardiovascular: Normal heart rate noted  Respiratory: Normal respiratory effort, no problems with respiration noted  Abdomen: Soft, gravid, appropriate for gestational age.  Pain/Pressure: Present     Pelvic: Cervical exam deferred        Extremities: Normal range of motion.  Edema: Trace  Mental Status: Normal mood and affect. Normal behavior. Normal judgment and thought content.   Assessment and Plan:  Pregnancy: G2P0101 at [redacted]w[redacted]d 1. Supervision of high risk pregnancy, antepartum BP and FHR normal Feeling  regular fetal movement  Report some abdominal pain, strict precautions provided on follow up   2. Chronic ITP (idiopathic thrombocytopenia) (HCC) Hematology saw her 06/08/22, per their recommendation, should not receive neuraxial anesthesia during delivery Most recent lab 6/21 platelets 59  3. Cirrhosis of liver without ascites, unspecified hepatic cirrhosis type [facility]) Following GI, Dr. Leone Payor, EGD completed 6/27, per their report, no varices noted. Per Dr Marvell Fuller notes unclear etiology, consider liver biopsy after delivery.   4. History of cesarean section, low transverse Vertical skin, LTCS uterine incision- discussed general anesthesia at last visit  Desires BTL  Had questions about scheduled c/s and what happens if she delivers early. Discussed the plan with patient that all of her information is in the chart and available for the care team and delivering provider.  Ultrasound 09/12/22  5. History of preterm delivery, currently pregnant PPROM at 25 weeks   6. Splenomegaly Secondary to cirrhosis most recent scan 08/15/22  8. Uninsured  9. Vaginal discharge Swab collected d/t hx of bv and yeast. Will call with results and treatment plan. Discussed gel vs PO option, patient prefer pill if yeast.   10. Language barrier Stratus ipad spanish interpreter used for entire visit   Preterm labor symptoms and general obstetric precautions including but not limited to vaginal bleeding, contractions, leaking of fluid and fetal movement were reviewed in detail with the patient. Please refer to After Visit Summary for other counseling recommendations.   Return in about 2 weeks (around 09/23/2022) for OB VISIT (MD ONLY).  Future Appointments  Date Time Provider Department Center  09/12/2022  2:30 PM [facility] NURSE [facility]  09/12/2022  2:45 PM [facility]  09/26/2022  1:15 PM Reva Bores, MD [facility]   Albertine Grates, FNP

## 2022-09-10 LAB — CERVICOVAGINAL ANCILLARY ONLY
Bacterial Vaginitis (gardnerella): NEGATIVE
Candida Glabrata: NEGATIVE
Candida Vaginitis: POSITIVE — AB
Comment: NEGATIVE
Comment: NEGATIVE
Comment: NEGATIVE

## 2022-09-11 MED ORDER — FLUCONAZOLE 150 MG PO TABS
150.0000 mg | ORAL_TABLET | Freq: Once | ORAL | 1 refills | Status: AC
Start: 2022-09-11 — End: 2022-09-11

## 2022-09-11 NOTE — Addendum Note (Signed)
Addended by: Sue Lush on: 09/11/2022 08:16 AM   Modules accepted: Orders

## 2022-09-12 ENCOUNTER — Ambulatory Visit: Payer: Self-pay | Attending: Family Medicine | Admitting: *Deleted

## 2022-09-12 ENCOUNTER — Ambulatory Visit (HOSPITAL_BASED_OUTPATIENT_CLINIC_OR_DEPARTMENT_OTHER): Payer: Self-pay

## 2022-09-12 VITALS — BP 135/74 | HR 92

## 2022-09-12 DIAGNOSIS — O0990 Supervision of high risk pregnancy, unspecified, unspecified trimester: Secondary | ICD-10-CM

## 2022-09-12 DIAGNOSIS — D649 Anemia, unspecified: Secondary | ICD-10-CM | POA: Insufficient documentation

## 2022-09-12 DIAGNOSIS — E669 Obesity, unspecified: Secondary | ICD-10-CM | POA: Insufficient documentation

## 2022-09-12 DIAGNOSIS — O99119 Other diseases of the blood and blood-forming organs and certain disorders involving the immune mechanism complicating pregnancy, unspecified trimester: Secondary | ICD-10-CM | POA: Insufficient documentation

## 2022-09-12 DIAGNOSIS — Z3A32 32 weeks gestation of pregnancy: Secondary | ICD-10-CM | POA: Insufficient documentation

## 2022-09-12 DIAGNOSIS — Z363 Encounter for antenatal screening for malformations: Secondary | ICD-10-CM | POA: Insufficient documentation

## 2022-09-12 DIAGNOSIS — O99013 Anemia complicating pregnancy, third trimester: Secondary | ICD-10-CM | POA: Insufficient documentation

## 2022-09-12 DIAGNOSIS — D696 Thrombocytopenia, unspecified: Secondary | ICD-10-CM | POA: Insufficient documentation

## 2022-09-12 DIAGNOSIS — O09219 Supervision of pregnancy with history of pre-term labor, unspecified trimester: Secondary | ICD-10-CM | POA: Insufficient documentation

## 2022-09-12 DIAGNOSIS — O34219 Maternal care for unspecified type scar from previous cesarean delivery: Secondary | ICD-10-CM | POA: Insufficient documentation

## 2022-09-12 DIAGNOSIS — O99210 Obesity complicating pregnancy, unspecified trimester: Secondary | ICD-10-CM | POA: Insufficient documentation

## 2022-09-13 ENCOUNTER — Other Ambulatory Visit: Payer: Self-pay | Admitting: *Deleted

## 2022-09-13 DIAGNOSIS — Z362 Encounter for other antenatal screening follow-up: Secondary | ICD-10-CM

## 2022-09-13 DIAGNOSIS — K7469 Other cirrhosis of liver: Secondary | ICD-10-CM

## 2022-09-16 ENCOUNTER — Inpatient Hospital Stay (HOSPITAL_COMMUNITY)
Admission: AD | Admit: 2022-09-16 | Discharge: 2022-09-16 | Disposition: A | Discharge status: Home / Self Care | Payer: Self-pay | Attending: Family Medicine | Admitting: Family Medicine

## 2022-09-16 ENCOUNTER — Encounter (HOSPITAL_COMMUNITY): Payer: Self-pay | Admitting: Family Medicine

## 2022-09-16 DIAGNOSIS — K7460 Unspecified cirrhosis of liver: Secondary | ICD-10-CM

## 2022-09-16 DIAGNOSIS — Z3A32 32 weeks gestation of pregnancy: Secondary | ICD-10-CM

## 2022-09-16 DIAGNOSIS — O99613 Diseases of the digestive system complicating pregnancy, third trimester: Secondary | ICD-10-CM | POA: Insufficient documentation

## 2022-09-16 DIAGNOSIS — Z98891 History of uterine scar from previous surgery: Secondary | ICD-10-CM

## 2022-09-16 DIAGNOSIS — O99891 Other specified diseases and conditions complicating pregnancy: Secondary | ICD-10-CM

## 2022-09-16 DIAGNOSIS — O26613 Liver and biliary tract disorders in pregnancy, third trimester: Secondary | ICD-10-CM | POA: Insufficient documentation

## 2022-09-16 DIAGNOSIS — Z1152 Encounter for screening for COVID-19: Secondary | ICD-10-CM | POA: Insufficient documentation

## 2022-09-16 DIAGNOSIS — R509 Fever, unspecified: Secondary | ICD-10-CM

## 2022-09-16 DIAGNOSIS — O26893 Other specified pregnancy related conditions, third trimester: Secondary | ICD-10-CM | POA: Insufficient documentation

## 2022-09-16 DIAGNOSIS — Z3493 Encounter for supervision of normal pregnancy, unspecified, third trimester: Secondary | ICD-10-CM

## 2022-09-16 DIAGNOSIS — M549 Dorsalgia, unspecified: Secondary | ICD-10-CM

## 2022-09-16 DIAGNOSIS — K5900 Constipation, unspecified: Secondary | ICD-10-CM | POA: Insufficient documentation

## 2022-09-16 DIAGNOSIS — D693 Immune thrombocytopenic purpura: Secondary | ICD-10-CM

## 2022-09-16 DIAGNOSIS — O99113 Other diseases of the blood and blood-forming organs and certain disorders involving the immune mechanism complicating pregnancy, third trimester: Secondary | ICD-10-CM | POA: Insufficient documentation

## 2022-09-16 DIAGNOSIS — O479 False labor, unspecified: Secondary | ICD-10-CM

## 2022-09-16 DIAGNOSIS — O34211 Maternal care for low transverse scar from previous cesarean delivery: Secondary | ICD-10-CM | POA: Insufficient documentation

## 2022-09-16 DIAGNOSIS — M545 Low back pain, unspecified: Secondary | ICD-10-CM | POA: Insufficient documentation

## 2022-09-16 LAB — COMPREHENSIVE METABOLIC PANEL
ALT: 14 U/L (ref 0–44)
AST: 36 U/L (ref 15–41)
Albumin: 2.4 g/dL — ABNORMAL LOW (ref 3.5–5.0)
Alkaline Phosphatase: 139 U/L — ABNORMAL HIGH (ref 38–126)
Anion gap: 7 (ref 5–15)
BUN: <5 mg/dL — ABNORMAL LOW (ref 6–20)
CO2: 18 mmol/L — ABNORMAL LOW (ref 22–32)
Calcium: 8 mg/dL — ABNORMAL LOW (ref 8.9–10.3)
Chloride: 105 mmol/L (ref 98–111)
Creatinine, Ser: 0.68 mg/dL (ref 0.44–1.00)
GFR, Estimated: >60 mL/min (ref >60)
Glucose, Bld: 83 mg/dL (ref 70–99)
Potassium: 3.7 mmol/L (ref 3.5–5.1)
Sodium: 130 mmol/L — ABNORMAL LOW (ref 135–145)
Total Bilirubin: 4.5 mg/dL — ABNORMAL HIGH (ref 0.3–1.2)
Total Protein: 6.1 g/dL — ABNORMAL LOW (ref 6.5–8.1)

## 2022-09-16 LAB — URINALYSIS, ROUTINE W REFLEX MICROSCOPIC
Bilirubin Urine: NEGATIVE
Glucose, UA: NEGATIVE mg/dL
Hgb urine dipstick: NEGATIVE
Ketones, ur: NEGATIVE mg/dL
Leukocytes,Ua: NEGATIVE
Nitrite: NEGATIVE
Protein, ur: 30 mg/dL — AB
Specific Gravity, Urine: 1.018 (ref 1.005–1.030)
pH: 7 (ref 5.0–8.0)

## 2022-09-16 LAB — POCT FERN TEST: POCT Fern Test: NEGATIVE

## 2022-09-16 LAB — CBC WITH DIFFERENTIAL/PLATELET
Abs Immature Granulocytes: 0.04 10*3/uL (ref 0.00–0.07)
Basophils Absolute: 0 10*3/uL (ref 0.0–0.1)
Basophils Relative: 0 %
Eosinophils Absolute: 0 10*3/uL (ref 0.0–0.5)
Eosinophils Relative: 0 %
HCT: 30.8 % — ABNORMAL LOW (ref 36.0–46.0)
Hemoglobin: 10.7 g/dL — ABNORMAL LOW (ref 12.0–15.0)
Immature Granulocytes: 1 %
Lymphocytes Relative: 4 %
Lymphs Abs: 0.3 10*3/uL — ABNORMAL LOW (ref 0.7–4.0)
MCH: 34.2 pg — ABNORMAL HIGH (ref 26.0–34.0)
MCHC: 34.7 g/dL (ref 30.0–36.0)
MCV: 98.4 fL (ref 80.0–100.0)
Monocytes Absolute: 0.5 10*3/uL (ref 0.1–1.0)
Monocytes Relative: 6 %
Neutro Abs: 7.2 10*3/uL (ref 1.7–7.7)
Neutrophils Relative %: 89 %
Platelets: 49 10*3/uL — ABNORMAL LOW (ref 150–400)
RBC: 3.13 MIL/uL — ABNORMAL LOW (ref 3.87–5.11)
RDW: 13.9 % (ref 11.5–15.5)
WBC: 8 10*3/uL (ref 4.0–10.5)
nRBC: 0.3 % — ABNORMAL HIGH (ref 0.0–0.2)

## 2022-09-16 LAB — LACTIC ACID, PLASMA: Lactic Acid, Venous: 1.9 mmol/L (ref 0.5–1.9)

## 2022-09-16 LAB — SARS CORONAVIRUS 2 BY RT PCR: SARS Coronavirus 2 by RT PCR: NEGATIVE

## 2022-09-16 MED ORDER — FAMOTIDINE 20 MG PO TABS: 20.0000 mg | ORAL_TABLET | Freq: Two times a day (BID) | ORAL | 0 refills | Status: DC

## 2022-09-16 MED ORDER — CYCLOBENZAPRINE HCL 5 MG PO TABS
10.0000 mg | ORAL_TABLET | Freq: Once | ORAL | Status: AC
  Administered 2022-09-16: 10 mg via ORAL
  Filled 2022-09-16: qty 2

## 2022-09-16 MED ORDER — LACTATED RINGERS IV BOLUS
1000.0000 mL | Freq: Once | INTRAVENOUS | Status: AC
  Administered 2022-09-16: 1000 mL via INTRAVENOUS

## 2022-09-16 MED ORDER — ACETAMINOPHEN 325 MG PO TABS
650.0000 mg | ORAL_TABLET | Freq: Once | ORAL | Status: AC
  Administered 2022-09-16: 650 mg via ORAL
  Filled 2022-09-16: qty 2

## 2022-09-16 MED ORDER — NIFEDIPINE 10 MG PO CAPS
10.0000 mg | ORAL_CAPSULE | ORAL | Status: AC
  Administered 2022-09-16 (×3): 10 mg via ORAL
  Filled 2022-09-16 (×3): qty 1

## 2022-09-16 NOTE — MAU Provider Note (Signed)
History     CSN: [id number]  Arrival date and time: 09/16/22 1235   Event Date/Time   First Provider Initiated Contact with Patient 09/16/22 1443      Chief Complaint  Patient presents with   Nausea   Abdominal Pain   HPI Megan Fuller is a 37 y.o. G2P0101 at [redacted]w[redacted]d who presents to MAU for multiple complaints. Patient reports pain in low back and abdomen that started this morning. She reports pain is intermittent and describes it as a throbbing sensation. She reports the pain is aggravated by the baby moving but also occurs randomly at other times. She vaginal bleeding. She reports watery vaginal discharge "for a while now". She reports a lot of vaginal itching, burning and redness. She reports she has "had a vaginal infections since she was 2 months pregnant, but nothing works". She has tried "a lot of medicines, both prescription and natural". She reports using mineral oil and coconut oil around her vagina does help some. She recently completed 1 pill for yeast infection and was told to repeat in 3 days if her symptoms still persisted but she hasn't taken it because "I came here". She endorses feeling a lot of pressure in her vagina and rectum x3 days but nothing comes out. She has not had recent intercourse. She denies urinary s/s or fever, but reports she is having chills. She also reports constipation and low appetite. She has not taken nausea medicine x3 months because "it was not helping my nausea". Patient verbalizes "can't you just take my baby out today".   Pregnancy course: Red Chart patient. Receives care at [facility], next appointment is on 8/1.  OB History     Gravida  2   Para  1   Term      Preterm  1   AB      Living  1      SAB      IAB      Ectopic      Multiple  0   Live Births  1           Past Medical History:  Diagnosis Date   Cirrhosis of liver (HCC)    Noted on 12/2021 CT scan. LFTs reassuring. Negative Hepatitis evaluation at [facility] in  2018. Patient had liver issues as a child, biopsy recommended but she her family did not want to do this. No history of alcoholism.   Depression 2021   E.coli UTI (urinary tract infection) during pregnancy, second trimester 10/27/2019   Negative TOC   Gastroesophageal reflux disease 08/09/2022   PCOS (polycystic ovarian syndrome)     Past Surgical History:  Procedure Laterality Date   CESAREAN SECTION N/A 12/26/2019   Procedure: CESAREAN SECTION;  Surgeon: [place] Bing, MD;  Location: [facility] LD ORS;  Service: Obstetrics;  Laterality: N/A;   ESOPHAGOGASTRODUODENOSCOPY N/A 08/22/2022   Procedure: ESOPHAGOGASTRODUODENOSCOPY (EGD);  Surgeon: Iva Boop, MD;  Location: [facility];  Service: Gastroenterology;  Laterality: N/A;    Family History  Problem Relation Age of Onset   Diabetes Maternal Grandmother    Stomach cancer Neg Hx    Colon cancer Neg Hx    Esophageal cancer Neg Hx    Asthma Neg Hx    Cancer Neg Hx    Heart disease Neg Hx    Hypertension Neg Hx     Social History   Tobacco Use   Smoking status: Never   Smokeless tobacco: Never  Vaping Use  Vaping status: Never Used  Substance Use Topics   Alcohol use: No    Alcohol/week: 0.0 standard drinks of alcohol   Drug use: No    Allergies:  Allergies  Allergen Reactions   Lactose Intolerance (Gi)     bloating    Medications Prior to Admission  Medication Sig Dispense Refill Last Dose   acetaminophen (TYLENOL) 325 MG tablet Take 650 mg by mouth every 6 (six) hours as needed for mild pain.   09/16/2022 at 0001   Ascorbic Acid (VITAMIN C PO) Take 1 tablet by mouth daily.   Past Week   Prenatal Vit-Fe Fumarate-FA (PRENATAL MULTIVITAMIN) TABS tablet Take 1 tablet by mouth daily.   Past Week   aspirin EC 81 MG tablet Take 1 tablet (81 mg total) by mouth daily. Start taking when you are [redacted] weeks pregnant for rest of pregnancy for prevention of preeclampsia (Patient not taking: Reported on 08/21/2022) 300 tablet 2     Omega-3 Fatty Acids (OMEGA 3 PO) Take 1 capsule by mouth daily. (Patient not taking: Reported on 09/12/2022)      ondansetron (ZOFRAN-ODT) 4 MG disintegrating tablet Take 1 tablet (4 mg total) by mouth 2 (two) times daily as needed for nausea or vomiting. Please don't take more than 2 tablets a day (Patient not taking: Reported on 08/09/2022) 30 tablet 1    [DISCONTINUED] famotidine (PEPCID) 20 MG tablet Take 1 tablet (20 mg total) by mouth 2 (two) times daily. (Patient not taking: Reported on 08/21/2022) 60 tablet 3    Review of Systems  Constitutional:  Positive for appetite change and chills. Negative for fever.  Gastrointestinal:  Positive for abdominal pain, constipation and nausea.  Genitourinary:  Positive for pelvic pain and vaginal discharge.       Vaginal itching, redness, burning   Musculoskeletal:  Positive for back pain.  All other systems reviewed and are negative.  Physical Exam  Patient Vitals for the past 24 hrs:  BP Temp Temp src Pulse Resp SpO2 Height Weight  09/16/22 2104 124/71 -- -- -- -- -- -- --  09/16/22 2026 135/70 -- -- (!) 103 -- -- -- --  09/16/22 2025 135/70 -- -- -- -- -- -- --  09/16/22 2007 131/81 -- -- (!) 108 17 -- -- --  09/16/22 2006 131/81 -- -- -- -- -- -- --  09/16/22 1908 -- 99 F (37.2 C) Oral -- -- -- -- --  09/16/22 1800 -- -- -- -- -- 98 % -- --  09/16/22 1755 -- -- -- -- -- 98 % -- --  09/16/22 1750 -- -- -- -- -- 98 % -- --  09/16/22 1745 -- -- -- -- -- 99 % -- --  09/16/22 1730 -- 100.1 F (37.8 C) -- -- 18 -- -- --  09/16/22 1523 -- (!) 102.2 F (39 C) Oral -- -- -- -- --  09/16/22 1316 113/81 99.2 F (37.3 C) Oral (!) 107 20 100 % 5\' 5"  (1.651 m) 93.6 kg    Physical Exam Vitals and nursing note reviewed. Exam conducted with a chaperone present.  Constitutional:      General: She is not in acute distress.    Appearance: She is obese.  Cardiovascular:     Rate and Rhythm: Tachycardia present.  Pulmonary:     Effort: Pulmonary  effort is normal. No respiratory distress.  Abdominal:     Palpations: Abdomen is soft.     Tenderness: There is no abdominal tenderness. There is  no right CVA tenderness or left CVA tenderness.  Genitourinary:    Comments: External female genitalia is erythematous and mildly swollen but otherwise normal, vaginal walls pink and well-rugated, scant creamy white discharge, no pooling of amniotic fluid, no bleeding, cervix visually closed without lesions/masses Skin:    General: Skin is warm and dry.  Neurological:     General: No focal deficit present.     Mental Status: She is alert and oriented to person, place, and time.  Psychiatric:        Mood and Affect: Mood normal.        Behavior: Behavior normal.   Dilation: Closed Effacement (%): Thick Cervical Position: Anterior Exam by:: Camelia Eng CNM  Results for orders placed or performed during the hospital encounter of 09/16/22 (from the past 24 hour(s))  Urinalysis, Routine w reflex microscopic -Urine, Clean Catch     Status: Abnormal   Collection Time: 09/16/22  1:22 PM  Result Value Ref Range   Color, Urine AMBER (A) YELLOW   APPearance CLEAR CLEAR   Specific Gravity, Urine 1.018 1.005 - 1.030   pH 7.0 5.0 - 8.0   Glucose, UA NEGATIVE NEGATIVE mg/dL   Hgb urine dipstick NEGATIVE NEGATIVE   Bilirubin Urine NEGATIVE NEGATIVE   Ketones, ur NEGATIVE NEGATIVE mg/dL   Protein, ur 30 (A) NEGATIVE mg/dL   Nitrite NEGATIVE NEGATIVE   Leukocytes,Ua NEGATIVE NEGATIVE   RBC / HPF 6-10 0 - 5 RBC/hpf   WBC, UA 0-5 0 - 5 WBC/hpf   Bacteria, UA RARE (A) NONE SEEN   Squamous Epithelial / HPF 6-10 0 - 5 /HPF   Mucus PRESENT   SARS Coronavirus 2 by RT PCR (hospital order, performed in [facility] hospital lab) *cepheid single result test* Anterior Nasal Swab     Status: None   Collection Time: 09/16/22  3:26 PM   Specimen: Anterior Nasal Swab  Result Value Ref Range   SARS Coronavirus 2 by RT PCR NEGATIVE NEGATIVE  CBC with  Differential/Platelet     Status: Abnormal   Collection Time: 09/16/22  4:26 PM  Result Value Ref Range   WBC 8.0 4.0 - 10.5 K/uL   RBC 3.13 (L) 3.87 - 5.11 MIL/uL   Hemoglobin 10.7 (L) 12.0 - 15.0 g/dL   HCT 46.9 (L) 62.9 - 52.8 %   MCV 98.4 80.0 - 100.0 fL   MCH 34.2 (H) 26.0 - 34.0 pg   MCHC 34.7 30.0 - 36.0 g/dL   RDW 41.3 24.4 - 01.0 %   Platelets 49 (L) 150 - 400 K/uL   nRBC 0.3 (H) 0.0 - 0.2 %   Neutrophils Relative % 89 %   Neutro Abs 7.2 1.7 - 7.7 K/uL   Lymphocytes Relative 4 %   Lymphs Abs 0.3 (L) 0.7 - 4.0 K/uL   Monocytes Relative 6 %   Monocytes Absolute 0.5 0.1 - 1.0 K/uL   Eosinophils Relative 0 %   Eosinophils Absolute 0.0 0.0 - 0.5 K/uL   Basophils Relative 0 %   Basophils Absolute 0.0 0.0 - 0.1 K/uL   Immature Granulocytes 1 %   Abs Immature Granulocytes 0.04 0.00 - 0.07 K/uL  Comprehensive metabolic panel     Status: Abnormal   Collection Time: 09/16/22  4:26 PM  Result Value Ref Range   Sodium 130 (L) 135 - 145 mmol/L   Potassium 3.7 3.5 - 5.1 mmol/L   Chloride 105 98 - 111 mmol/L   CO2 18 (L) 22 - 32  mmol/L   Glucose, Bld 83 70 - 99 mg/dL   BUN <5 (L) 6 - 20 mg/dL   Creatinine, Ser 1.61 0.44 - 1.00 mg/dL   Calcium 8.0 (L) 8.9 - 10.3 mg/dL   Total Protein 6.1 (L) 6.5 - 8.1 g/dL   Albumin 2.4 (L) 3.5 - 5.0 g/dL   AST 36 15 - 41 U/L   ALT 14 0 - 44 U/L   Alkaline Phosphatase 139 (H) 38 - 126 U/L   Total Bilirubin 4.5 (H) 0.3 - 1.2 mg/dL   GFR, Estimated >09 >60 mL/min   Anion gap 7 5 - 15  Lactic acid, plasma     Status: None   Collection Time: 09/16/22  4:26 PM  Result Value Ref Range   Lactic Acid, Venous 1.9 0.5 - 1.9 mmol/L  Fern Test     Status: None   Collection Time: 09/16/22  5:25 PM  Result Value Ref Range   POCT Fern Test Negative = intact amniotic membranes    NST FHR: 165 bpm, moderate variability, +15x15 accels, no decels Toco: Q 2-32mins  MAU Course  Procedures  MDM NST Sterile spec, Fern UA, urine culture, CBC, CMP,  Lactic acid, Covid test LR bolus Tylenol, Flexeril Procardia  On arrival, fetal tachycardia noted. Patient's initial temp normal, however patient noted to be shivering and felt warm to touch. Patient found to be afebrile. I discussed recommendations for IVF's, tocolytics, and labs. Patient initially declined. I discussed that I was concerned not only for her status but her baby's well-being. After further discussion, patient agreeable to LR bolus and labs. I discussed patient's history and findings with Dr. Adrian Blackwater. Based on patient's most recently LFTs, patient okay to have 650mg  Tylenol. Patient also agreeable to Tylenol and Flexeril. She continues to decline tocolytics or anti-emetics. She has requested to have something to eat multiple times per RN.   On reassessment, patient reports back pain has improved but is still reporting frequent contractions. She is afebrile. Fetal tachycardia has improved-baseline down to 150s, as well as maternal tachycardia. I discussed use of tocolytics with patient as cervix remains closed. After further discussion, patient agreeable to Procardia. I reviewed labs with Dr. Adrian Blackwater- no leukocytosis, platelets stable from previous visits. Lactic acid normal range. CMP unremarkable. Covid negative. He does recommend BMZ, however patient declines. I suspect fever likely a viral syndrome, low suspicion for pyelonephritis or UTI given physical exam and urine results, however urine culture sent. Low suspicion for ruptured membranes.   Patient asking if baby can be delivered today. Discussed with patient at length that preterm delivery is not an option unless there is a medical need to. Cervix remains closed and fetal tachycardia has resolved. I reviewed findings with patient and discussed that delivery of baby today is not an option.  After 3 doses of Procardia patient reports contractions have spaced out. She reports she feels like her throat is starting to hurt. She declines  RX for antiemetics or flexeril but requests something for heartburn. She was also instructed to complete vaginal yeast medication.  Assessment and Plan   1. [redacted] weeks gestation of pregnancy   2. Fever, unknown origin   3. Uterine contractions   4. Back pain affecting pregnancy in third trimester   5. Intact amniotic membranes during pregnancy in third trimester   6. History of cesarean section, low transverse   7. Chronic ITP (idiopathic thrombocytopenia) (HCC)   8. Cirrhosis of liver without ascites, unspecified hepatic cirrhosis type (HCC)    -  Discharge home in stable condition - Return precautions given. Return to MAU as needed - Keep OB appointment as scheduled  Brand Males, CNM 09/16/2022, 10:11 PM

## 2022-09-16 NOTE — MAU Note (Signed)
Megan Fuller is a 37 y.o. at [redacted]w[redacted]d here in MAU reporting: has not ate or drank anything except water for 3 days.  Has been very nauseated and had heartburn.  Is very thirsty.  Has not thrown up.  Pain in RLQ, thought it was the baby pushing, but it hasn't stopped.  Pain in lower abd and lower back.  Having pain at incision site. Pain is killing her, is afraid the incision is breaking and she is asking if we can take the baby.  Onset of complaint: 3days ago Pain score: 8 Vitals:   09/16/22 1316  BP: 113/81  Pulse: (!) 107  Resp: 20  Temp: 99.2 F (37.3 C)  SpO2: 100%     FHT:160 Lab orders placed from triage:  urine  Has had a vaginal infectiion since she was 2 months preg.  Everything is swollen, red, burning and itching.  Has been tested several times and been treated, but the infection is still there.   Reports bruises easily, every time her BP is taken she gets a bruise.   Thinks it might be from her "low platelets".

## 2022-09-16 NOTE — Discharge Instructions (Signed)
Las medicinas seguras para tomar Academic librarian   Resfriados/Tos/Alergias:  Benadryl (sin alcohol) 25 mg cada 6 horas segn lo necesite Breath Right strips (Tiras para respirar correctamente)  Claritin  Cepacol (pastillas de chupar para la garganta)  Chloraseptic (aerosol para la garganta)  Cold-Eeze- hasta tres veces por da  Cough drops (pastillas de chupar para la tos, sin alcohol)  Flonase (con receta mdica solamente)  Guaifenesin  Mucinex  Robitussin DM (simple solamente, sin alcohol)  Saline nasal spray/drops (Aerosol nasal salino/gotas) Sudafed (pseudoephedrine) y  Actifed * utilizar slo despus de 12 semanas de gestacin y si no tiene la presin arterial alta.  Tylenol Vicks  VapoRub  Zinc lozenges (pastillas para la garganta)  Zyrtec  Estreimiento:  Colace  Ducolax (supositorios)  Fleet enema (lavado intestinal rectal)  Glycerin (supositorios)  Metamucil  Milk of magnesia (leche de magnesia)  Miralax  Senokot  Smooth Move (t)  Indigestin:  Tums  Maalox  Mylanta  Zantac  Pepcid  Insomnia:  Benadryl (sin alcohol) 25mg  cada 6 horas segn lo necesite  Tylenol PM  Unisom, no Gelcaps  Nuseas/Vmitos:  Bonine  Dramamine  Emetrol  Ginger (extracto)  Sea-Bands  Meclizine  Medicina para las nuseas que puede tomar durante el embarazo: Unisom (doxylamine succinate, pastillas de 25 mg) Tome una pastilla al da al [place]. Si los sntomas no estn adecuadamente controlados, la dosis puede aumentarse hasta una dosis mxima recomendada de [employer] al da (1/2 pastilla por la [place], 1/2 pastilla a media tarde y Neomia Dear pastilla al [place]). Pastillas de Vitamina B6 de 100mg . Tome [employer] veces al da (hasta 200 mg por da).

## 2022-09-16 NOTE — MAU Note (Signed)
Pt reports contractions are the same strength as before procardia-2nd dose given as ordered

## 2022-09-16 NOTE — MAU Note (Signed)
Pt awoken to take 3rd procardia dose - states she is feeling contractions less  frequently and less intensity

## 2022-09-17 LAB — CULTURE, OB URINE: Culture: <10000 — AB

## 2022-09-19 ENCOUNTER — Ambulatory Visit: Payer: Self-pay | Admitting: *Deleted

## 2022-09-19 ENCOUNTER — Ambulatory Visit: Payer: Self-pay | Attending: Maternal & Fetal Medicine | Admitting: *Deleted

## 2022-09-19 VITALS — BP 127/77 | HR 91

## 2022-09-19 DIAGNOSIS — Z3A33 33 weeks gestation of pregnancy: Secondary | ICD-10-CM | POA: Insufficient documentation

## 2022-09-19 DIAGNOSIS — R161 Splenomegaly, not elsewhere classified: Secondary | ICD-10-CM | POA: Insufficient documentation

## 2022-09-19 DIAGNOSIS — O99213 Obesity complicating pregnancy, third trimester: Secondary | ICD-10-CM | POA: Insufficient documentation

## 2022-09-19 DIAGNOSIS — O99613 Diseases of the digestive system complicating pregnancy, third trimester: Secondary | ICD-10-CM | POA: Insufficient documentation

## 2022-09-19 DIAGNOSIS — D473 Essential (hemorrhagic) thrombocythemia: Secondary | ICD-10-CM | POA: Insufficient documentation

## 2022-09-19 DIAGNOSIS — K7460 Unspecified cirrhosis of liver: Secondary | ICD-10-CM | POA: Insufficient documentation

## 2022-09-19 NOTE — Procedures (Signed)
Megan Fuller Lovo 06/01/1985 [redacted]w[redacted]d  Fetus A Non-Stress Test Interpretation for 09/19/22--NST ONLY  Indication:  cirrhosis, idiopathic throbocytopenia  Fetal Heart Rate A Mode: External Baseline Rate (A): 140 bpm Variability: Moderate Accelerations: 15 x 15 Decelerations: None Multiple birth?: No  Uterine Activity Mode: Toco Contraction Frequency (min): none Resting Tone Palpated: Relaxed  Interpretation (Fetal Testing) Nonstress Test Interpretation: Reactive Comments: Tracing reviewed by Dr. Judeth Cornfield

## 2022-09-26 ENCOUNTER — Encounter: Payer: Self-pay | Admitting: Family Medicine

## 2022-09-26 ENCOUNTER — Ambulatory Visit: Payer: Self-pay | Attending: Maternal & Fetal Medicine | Admitting: *Deleted

## 2022-09-26 ENCOUNTER — Ambulatory Visit: Payer: Self-pay | Admitting: *Deleted

## 2022-09-26 VITALS — BP 124/72 | HR 80

## 2022-09-26 DIAGNOSIS — O99893 Other specified diseases and conditions complicating puerperium: Secondary | ICD-10-CM | POA: Insufficient documentation

## 2022-09-26 DIAGNOSIS — R161 Splenomegaly, not elsewhere classified: Secondary | ICD-10-CM | POA: Insufficient documentation

## 2022-09-26 DIAGNOSIS — O99119 Other diseases of the blood and blood-forming organs and certain disorders involving the immune mechanism complicating pregnancy, unspecified trimester: Secondary | ICD-10-CM

## 2022-09-26 DIAGNOSIS — K7469 Other cirrhosis of liver: Secondary | ICD-10-CM

## 2022-09-26 DIAGNOSIS — D696 Thrombocytopenia, unspecified: Secondary | ICD-10-CM | POA: Insufficient documentation

## 2022-09-26 DIAGNOSIS — O99113 Other diseases of the blood and blood-forming organs and certain disorders involving the immune mechanism complicating pregnancy, third trimester: Secondary | ICD-10-CM | POA: Insufficient documentation

## 2022-09-26 DIAGNOSIS — Z3A34 34 weeks gestation of pregnancy: Secondary | ICD-10-CM | POA: Insufficient documentation

## 2022-09-26 NOTE — Procedures (Signed)
Megan Fuller Lovo [birth year] [redacted]w[redacted]d  Fetus A Non-Stress Test Interpretation for 09/26/22  Indication:  cirrhosis, splenomegaly, IDP  Fetal Heart Rate A Mode: External Baseline Rate (A): 145 bpm Variability: Moderate Accelerations: 15 x 15 Decelerations: None Multiple birth?: No  Uterine Activity Mode: Toco Contraction Frequency (min): none Resting Tone Palpated: Relaxed  Interpretation (Fetal Testing) Nonstress Test Interpretation: Reactive Comments: Tracing reviewed byDr. Parke Poisson

## 2022-09-27 ENCOUNTER — Other Ambulatory Visit: Payer: Self-pay | Admitting: Obstetrics and Gynecology

## 2022-09-27 DIAGNOSIS — Z3A32 32 weeks gestation of pregnancy: Secondary | ICD-10-CM

## 2022-10-01 ENCOUNTER — Other Ambulatory Visit: Payer: Self-pay

## 2022-10-01 ENCOUNTER — Ambulatory Visit (INDEPENDENT_AMBULATORY_CARE_PROVIDER_SITE_OTHER): Payer: Self-pay | Admitting: Obstetrics & Gynecology

## 2022-10-01 VITALS — BP 129/88 | HR 93 | Wt 205.7 lb

## 2022-10-01 DIAGNOSIS — K7460 Unspecified cirrhosis of liver: Secondary | ICD-10-CM

## 2022-10-01 DIAGNOSIS — O0993 Supervision of high risk pregnancy, unspecified, third trimester: Secondary | ICD-10-CM

## 2022-10-01 DIAGNOSIS — O0990 Supervision of high risk pregnancy, unspecified, unspecified trimester: Secondary | ICD-10-CM

## 2022-10-01 DIAGNOSIS — Z3A34 34 weeks gestation of pregnancy: Secondary | ICD-10-CM

## 2022-10-01 DIAGNOSIS — Z7189 Other specified counseling: Secondary | ICD-10-CM

## 2022-10-01 DIAGNOSIS — O99210 Obesity complicating pregnancy, unspecified trimester: Secondary | ICD-10-CM

## 2022-10-01 DIAGNOSIS — O99213 Obesity complicating pregnancy, third trimester: Secondary | ICD-10-CM

## 2022-10-01 DIAGNOSIS — Z98891 History of uterine scar from previous surgery: Secondary | ICD-10-CM

## 2022-10-01 NOTE — Progress Notes (Signed)
   PRENATAL VISIT NOTE  Subjective:  Megan Fuller is a 37 y.o. G2P0101 at [redacted]w[redacted]d being seen today for ongoing prenatal care.  She is currently monitored for the following issues for this high-risk pregnancy and has PCOS (polycystic ovarian syndrome); Chronic ITP (idiopathic thrombocytopenia) (HCC); Depression affecting pregnancy in second trimester, antepartum; History of preterm delivery, currently pregnant; History of cesarean section, low transverse; Supervision of high risk pregnancy, antepartum; Obesity in pregnancy, antepartum; Cirrhosis (HCC); Proteinuria affecting pregnancy in second trimester; Splenomegaly; Macrocytic anemia during pregnancy; Red Chart Rounds Patient; Uninsured; and Thrombocytopenia (HCC) on their problem list.  Patient reports no complaints.  Contractions: Irritability. Vag. Bleeding: None.  Movement: Present. Denies leaking of fluid.   The following portions of the patient's history were reviewed and updated as appropriate: allergies, current medications, past family history, past medical history, past social history, past surgical history and problem list.   Objective:   Vitals:   10/01/22 1543  BP: 129/88  Pulse: 93  Weight: 205 lb 11.2 oz (93.3 kg)    Fetal Status: Fetal Heart Rate (bpm): 145   Movement: Present     General:  Alert, oriented and cooperative. Patient is in no acute distress.  Skin: Skin is warm and dry. No rash noted.   Cardiovascular: Normal heart rate noted  Respiratory: Normal respiratory effort, no problems with respiration noted  Abdomen: Soft, gravid, appropriate for gestational age.  Pain/Pressure: Present     Pelvic: Cervical exam deferred        Extremities: Normal range of motion.  Edema: Trace  Mental Status: Normal mood and affect. Normal behavior. Normal judgment and thought content.   Assessment and Plan:  Pregnancy: G2P0101 at [redacted]w[redacted]d 1. Cirrhosis of liver without ascites, unspecified hepatic cirrhosis type (HCC) Nl  LFT  2. Supervision of high risk pregnancy, antepartum thrombocytopenia  3. Obesity in pregnancy, antepartum   4. History of cesarean section, low transverse Plans repeat at 39 weeks  5. Red Chart Rounds Patient   Preterm labor symptoms and general obstetric precautions including but not limited to vaginal bleeding, contractions, leaking of fluid and fetal movement were reviewed in detail with the patient. Please refer to After Visit Summary for other counseling recommendations.   Return in about 1 week (around 10/08/2022).  Future Appointments  Date Time Provider Department Center  10/03/2022  9:30 AM [facility] NURSE [facility]  10/03/2022  9:45 AM [facility] NST [facility]  10/10/2022 10:15 AM [facility] NURSE [facility]  10/10/2022 10:30 AM [facility] US2 [facility]  10/17/2022 10:45 AM Crissie Reese, Mary Sella, MD [facility]    Scheryl Darter, MD

## 2022-10-03 ENCOUNTER — Ambulatory Visit: Payer: Self-pay

## 2022-10-10 ENCOUNTER — Other Ambulatory Visit: Payer: Self-pay | Admitting: *Deleted

## 2022-10-10 ENCOUNTER — Ambulatory Visit: Payer: Self-pay | Admitting: *Deleted

## 2022-10-10 ENCOUNTER — Ambulatory Visit: Payer: Self-pay | Attending: Maternal & Fetal Medicine

## 2022-10-10 VITALS — BP 134/84 | HR 81

## 2022-10-10 DIAGNOSIS — O99213 Obesity complicating pregnancy, third trimester: Secondary | ICD-10-CM | POA: Insufficient documentation

## 2022-10-10 DIAGNOSIS — Z362 Encounter for other antenatal screening follow-up: Secondary | ICD-10-CM | POA: Insufficient documentation

## 2022-10-10 DIAGNOSIS — D649 Anemia, unspecified: Secondary | ICD-10-CM

## 2022-10-10 DIAGNOSIS — O09213 Supervision of pregnancy with history of pre-term labor, third trimester: Secondary | ICD-10-CM

## 2022-10-10 DIAGNOSIS — Z7189 Other specified counseling: Secondary | ICD-10-CM | POA: Insufficient documentation

## 2022-10-10 DIAGNOSIS — O26613 Liver and biliary tract disorders in pregnancy, third trimester: Secondary | ICD-10-CM

## 2022-10-10 DIAGNOSIS — O99013 Anemia complicating pregnancy, third trimester: Secondary | ICD-10-CM

## 2022-10-10 DIAGNOSIS — Z3A36 36 weeks gestation of pregnancy: Secondary | ICD-10-CM

## 2022-10-10 DIAGNOSIS — K7460 Unspecified cirrhosis of liver: Secondary | ICD-10-CM

## 2022-10-10 DIAGNOSIS — O34219 Maternal care for unspecified type scar from previous cesarean delivery: Secondary | ICD-10-CM

## 2022-10-10 DIAGNOSIS — D696 Thrombocytopenia, unspecified: Secondary | ICD-10-CM

## 2022-10-10 DIAGNOSIS — E669 Obesity, unspecified: Secondary | ICD-10-CM

## 2022-10-10 DIAGNOSIS — O99113 Other diseases of the blood and blood-forming organs and certain disorders involving the immune mechanism complicating pregnancy, third trimester: Secondary | ICD-10-CM

## 2022-10-10 DIAGNOSIS — K7469 Other cirrhosis of liver: Secondary | ICD-10-CM | POA: Insufficient documentation

## 2022-10-10 DIAGNOSIS — D693 Immune thrombocytopenic purpura: Secondary | ICD-10-CM

## 2022-10-11 ENCOUNTER — Other Ambulatory Visit: Payer: Self-pay | Admitting: *Deleted

## 2022-10-11 DIAGNOSIS — D693 Immune thrombocytopenic purpura: Secondary | ICD-10-CM

## 2022-10-11 DIAGNOSIS — K7469 Other cirrhosis of liver: Secondary | ICD-10-CM

## 2022-10-16 ENCOUNTER — Encounter (HOSPITAL_COMMUNITY): Payer: Self-pay

## 2022-10-16 NOTE — Patient Instructions (Signed)
Megan Fuller Lovo  10/16/2022   Your procedure is scheduled on:  10/30/2022  Arrive at 1015 at Entrance C on [employer] at [facility]  and [employer]. You are invited to use the FREE valet parking or use the Visitor's parking deck.  Pick up the phone at the desk and dial [phone].  Call this number if you have problems the morning of surgery: [phone]  Remember:   Do not eat food:(After Midnight) Desps de medianoche.  Do not drink clear liquids: (After Midnight) Desps de medianoche.  Take these medicines the morning of surgery with A SIP OF WATER:  none   Do not wear jewelry, make-up or nail polish.  Do not wear lotions, powders, or perfumes. Do not wear deodorant.  Do not shave 48 hours prior to surgery.  Do not bring valuables to the hospital.  [facility] [employer] [employer] is not   responsible for any belongings or valuables brought to the hospital.  Contacts, dentures or bridgework may not be worn into surgery.  Leave suitcase in the car. After surgery it may be brought to your room.  For patients admitted to the hospital, checkout time is 11:00 AM the day of              discharge.      Please read over the following fact sheets that you were given:     Preparing for Surgery                                          Instrucciones Para Antes de la Ciruga   Su ciruga est programada para 10/30/2022  (your procedure is scheduled on) Entre por la entrada principal del [facility]  a las [address] de la [place] -(enter through the main entrance at [facility] at [employer] AM    [employer] [place] [place] [address] para informarnos de su llegada. (pick up phone, dial [phone] on arrival)     Por favor llame al [phone] si tiene algn problema en la maana de la ciruga (please call this number if you have any problems the morning of surgery.)                  Recuerde: (Remember)  No coma alimentos. (Do not eat food (After Midnight) Desps de medianoche)    No tome  lquidos claros. (Do not drink clear liquids (After Midnight) Desps de medianoche)    No use joyas, maquillaje de ojos, lpiz labial, crema para el cuerpo o esmalte de uas oscuro. (Do not wear jewelry, eye makeup, lipstick, body lotion, or dark fingernail polish). Puede usar desodorante (you may wear deodorant)    No se afeite 48 horas de su ciruga. (Do not shave 48 hours before your surgery)    No traiga objetos de valor al hospital.  [place] no se hace responsable de ninguna pertenencia, ni objetos de valor que haya trado al hospital. (Do not bring valuable to the hospital.  [place] is not responsible for any belongings or valuables brought to the hospital)   [facility] medicinas en la maana de la ciruga con un SORBITO de agua nada (take these meds the morning of surgery with a SIP of water)     Durante la ciruga no se pueden usar lentes de contacto, dentaduras o puentes. (Contacts, dentures or bridgework cannot be worn in surgery).  Si va a ser ingresado despus de la ciruga, deje la [employer] en el carro hasta que se le haya asignado una habitacin. (If you are to be admitted after surgery, leave suitcase in car until your room has been assigned.)   A los pacientes que se les d de alta el mismo da no se les permitir manejar a casa.  (Patients discharged on the day of surgery will not be allowed to drive home)    [place] [employer], [employer]. (Name and telephone number of your driver)   Instrucciones especiales Shower using CHG 2 nights before surgery and the night before surgery.  If you shower the day of surgery use CHG.  Use special wash - you have one bottle of CHG for all showers.  You should use approximately 1/3 of the bottle for each shower. (Special Instructions)   Por favor, lea las hojas informativas que le entregaron. (Please read over the following fact sheets that you were given) Surgical Site Infection Prevention

## 2022-10-16 NOTE — Pre-Procedure Instructions (Addendum)
[id number] interpreter number

## 2022-10-17 ENCOUNTER — Encounter (HOSPITAL_COMMUNITY)
Admission: AD | Disposition: A | Discharge status: Home / Self Care | Payer: Self-pay | Source: Home / Self Care | Attending: Family Medicine

## 2022-10-17 ENCOUNTER — Inpatient Hospital Stay (HOSPITAL_COMMUNITY): Payer: Self-pay | Admitting: Anesthesiology

## 2022-10-17 ENCOUNTER — Encounter: Payer: Self-pay | Admitting: Family Medicine

## 2022-10-17 ENCOUNTER — Inpatient Hospital Stay (HOSPITAL_COMMUNITY)
Admission: AD | Admit: 2022-10-17 | Discharge: 2022-10-21 | DRG: 784 | Disposition: A | Discharge status: Home / Self Care | Payer: Self-pay | Attending: Family Medicine | Admitting: Family Medicine

## 2022-10-17 ENCOUNTER — Encounter (HOSPITAL_COMMUNITY): Payer: Self-pay | Admitting: Obstetrics & Gynecology

## 2022-10-17 DIAGNOSIS — D62 Acute posthemorrhagic anemia: Secondary | ICD-10-CM | POA: Diagnosis not present

## 2022-10-17 DIAGNOSIS — Z3A37 37 weeks gestation of pregnancy: Secondary | ICD-10-CM

## 2022-10-17 DIAGNOSIS — O99214 Obesity complicating childbirth: Secondary | ICD-10-CM | POA: Diagnosis present

## 2022-10-17 DIAGNOSIS — Z302 Encounter for sterilization: Secondary | ICD-10-CM

## 2022-10-17 DIAGNOSIS — D539 Nutritional anemia, unspecified: Secondary | ICD-10-CM

## 2022-10-17 DIAGNOSIS — O99019 Anemia complicating pregnancy, unspecified trimester: Secondary | ICD-10-CM

## 2022-10-17 DIAGNOSIS — Z98891 History of uterine scar from previous surgery: Secondary | ICD-10-CM

## 2022-10-17 DIAGNOSIS — E282 Polycystic ovarian syndrome: Secondary | ICD-10-CM

## 2022-10-17 DIAGNOSIS — O34211 Maternal care for low transverse scar from previous cesarean delivery: Secondary | ICD-10-CM

## 2022-10-17 DIAGNOSIS — K7460 Unspecified cirrhosis of liver: Secondary | ICD-10-CM

## 2022-10-17 DIAGNOSIS — O09899 Supervision of other high risk pregnancies, unspecified trimester: Secondary | ICD-10-CM

## 2022-10-17 DIAGNOSIS — O99210 Obesity complicating pregnancy, unspecified trimester: Secondary | ICD-10-CM

## 2022-10-17 DIAGNOSIS — O4593 Premature separation of placenta, unspecified, third trimester: Secondary | ICD-10-CM

## 2022-10-17 DIAGNOSIS — F32A Depression, unspecified: Secondary | ICD-10-CM | POA: Diagnosis present

## 2022-10-17 DIAGNOSIS — R161 Splenomegaly, not elsewhere classified: Secondary | ICD-10-CM

## 2022-10-17 DIAGNOSIS — O45003 Premature separation of placenta with coagulation defect, unspecified, third trimester: Principal | ICD-10-CM | POA: Diagnosis present

## 2022-10-17 DIAGNOSIS — Z5971 Insufficient health insurance coverage: Secondary | ICD-10-CM

## 2022-10-17 DIAGNOSIS — D696 Thrombocytopenia, unspecified: Principal | ICD-10-CM

## 2022-10-17 DIAGNOSIS — O0990 Supervision of high risk pregnancy, unspecified, unspecified trimester: Secondary | ICD-10-CM

## 2022-10-17 DIAGNOSIS — Z5989 Other problems related to housing and economic circumstances: Secondary | ICD-10-CM

## 2022-10-17 DIAGNOSIS — O99344 Other mental disorders complicating childbirth: Secondary | ICD-10-CM

## 2022-10-17 DIAGNOSIS — O99342 Other mental disorders complicating pregnancy, second trimester: Secondary | ICD-10-CM

## 2022-10-17 DIAGNOSIS — Z7189 Other specified counseling: Secondary | ICD-10-CM

## 2022-10-17 DIAGNOSIS — O1212 Gestational proteinuria, second trimester: Secondary | ICD-10-CM

## 2022-10-17 DIAGNOSIS — O9912 Other diseases of the blood and blood-forming organs and certain disorders involving the immune mechanism complicating childbirth: Secondary | ICD-10-CM

## 2022-10-17 DIAGNOSIS — O2662 Liver and biliary tract disorders in childbirth: Secondary | ICD-10-CM | POA: Diagnosis present

## 2022-10-17 DIAGNOSIS — D693 Immune thrombocytopenic purpura: Secondary | ICD-10-CM

## 2022-10-17 DIAGNOSIS — O9081 Anemia of the puerperium: Secondary | ICD-10-CM | POA: Diagnosis not present

## 2022-10-17 LAB — CBC WITH DIFFERENTIAL/PLATELET
Abs Immature Granulocytes: 0.04 10*3/uL (ref 0.00–0.07)
Basophils Absolute: 0 10*3/uL (ref 0.0–0.1)
Basophils Relative: 1 %
Eosinophils Absolute: 0.1 10*3/uL (ref 0.0–0.5)
Eosinophils Relative: 1 %
HCT: 32.4 % — ABNORMAL LOW (ref 36.0–46.0)
Hemoglobin: 11.1 g/dL — ABNORMAL LOW (ref 12.0–15.0)
Immature Granulocytes: 1 %
Lymphocytes Relative: 13 %
Lymphs Abs: 0.8 10*3/uL (ref 0.7–4.0)
MCH: 33.6 pg (ref 26.0–34.0)
MCHC: 34.3 g/dL (ref 30.0–36.0)
MCV: 98.2 fL (ref 80.0–100.0)
Monocytes Absolute: 0.5 10*3/uL (ref 0.1–1.0)
Monocytes Relative: 8 %
Neutro Abs: 4.8 10*3/uL (ref 1.7–7.7)
Neutrophils Relative %: 76 %
Platelets: 55 10*3/uL — ABNORMAL LOW (ref 150–400)
RBC: 3.3 MIL/uL — ABNORMAL LOW (ref 3.87–5.11)
RDW: 15.1 % (ref 11.5–15.5)
WBC: 6.3 10*3/uL (ref 4.0–10.5)
nRBC: 0 % (ref 0.0–0.2)

## 2022-10-17 LAB — COMPREHENSIVE METABOLIC PANEL
ALT: 13 U/L (ref 0–44)
AST: 37 U/L (ref 15–41)
Albumin: 2.4 g/dL — ABNORMAL LOW (ref 3.5–5.0)
Alkaline Phosphatase: 165 U/L — ABNORMAL HIGH (ref 38–126)
Anion gap: 9 (ref 5–15)
BUN: 5 mg/dL — ABNORMAL LOW (ref 6–20)
CO2: 20 mmol/L — ABNORMAL LOW (ref 22–32)
Calcium: 8.4 mg/dL — ABNORMAL LOW (ref 8.9–10.3)
Chloride: 108 mmol/L (ref 98–111)
Creatinine, Ser: 0.71 mg/dL (ref 0.44–1.00)
GFR, Estimated: >60 mL/min (ref >60)
Glucose, Bld: 76 mg/dL (ref 70–99)
Potassium: 3.8 mmol/L (ref 3.5–5.1)
Sodium: 137 mmol/L (ref 135–145)
Total Bilirubin: 3.6 mg/dL — ABNORMAL HIGH (ref 0.3–1.2)
Total Protein: 5.9 g/dL — ABNORMAL LOW (ref 6.5–8.1)

## 2022-10-17 LAB — PROTIME-INR
INR: 1.3 — ABNORMAL HIGH (ref 0.8–1.2)
INR: 1.4 — ABNORMAL HIGH (ref 0.8–1.2)
Prothrombin Time: 15.9 seconds — ABNORMAL HIGH (ref 11.4–15.2)
Prothrombin Time: 16.9 seconds — ABNORMAL HIGH (ref 11.4–15.2)

## 2022-10-17 LAB — APTT
aPTT: 33 seconds (ref 24–36)
aPTT: 37 seconds — ABNORMAL HIGH (ref 24–36)

## 2022-10-17 LAB — PREPARE RBC (CROSSMATCH)

## 2022-10-17 LAB — FIBRINOGEN
Fibrinogen: 207 mg/dL — ABNORMAL LOW (ref 210–475)
Fibrinogen: 158 mg/dL — ABNORMAL LOW (ref 210–475)

## 2022-10-17 LAB — RPR: RPR Ser Ql: NONREACTIVE

## 2022-10-17 SURGERY — Surgical Case
Anesthesia: General

## 2022-10-17 MED ORDER — SIMETHICONE 80 MG PO CHEW
80.0000 mg | CHEWABLE_TABLET | ORAL | Status: DC | PRN
  Administered 2022-10-18: 80 mg via ORAL
  Filled 2022-10-17 (×2): qty 1

## 2022-10-17 MED ORDER — ACETAMINOPHEN 10 MG/ML IV SOLN: 1000.0000 mg | Freq: Once | INTRAVENOUS | Status: DC | PRN

## 2022-10-17 MED ORDER — ONDANSETRON HCL 4 MG/2ML IJ SOLN: 4.0000 mg | Freq: Four times a day (QID) | INTRAMUSCULAR | Status: DC | PRN

## 2022-10-17 MED ORDER — CARBOPROST TROMETHAMINE 250 MCG/ML IM SOLN
INTRAMUSCULAR | Status: DC | PRN
  Administered 2022-10-17: 250 ug via INTRAMUSCULAR

## 2022-10-17 MED ORDER — PROPOFOL 10 MG/ML IV BOLUS
INTRAVENOUS | Status: AC
  Filled 2022-10-17: qty 20

## 2022-10-17 MED ORDER — OXYCODONE HCL 5 MG PO TABS
10.0000 mg | ORAL_TABLET | ORAL | Status: DC | PRN
  Filled 2022-10-17: qty 2

## 2022-10-17 MED ORDER — MIDAZOLAM HCL 2 MG/2ML IJ SOLN
INTRAMUSCULAR | Status: DC | PRN
  Administered 2022-10-17: 2 mg via INTRAVENOUS

## 2022-10-17 MED ORDER — SENNOSIDES-DOCUSATE SODIUM 8.6-50 MG PO TABS
2.0000 | ORAL_TABLET | Freq: Every day | ORAL | Status: DC
  Administered 2022-10-18 – 2022-10-21 (×3): 2 via ORAL
  Filled 2022-10-17 (×3): qty 2

## 2022-10-17 MED ORDER — ALBUMIN HUMAN 5 % IV SOLN
INTRAVENOUS | Status: AC
  Filled 2022-10-17: qty 250

## 2022-10-17 MED ORDER — FENTANYL CITRATE (PF) 250 MCG/5ML IJ SOLN
INTRAMUSCULAR | Status: AC
  Filled 2022-10-17: qty 5

## 2022-10-17 MED ORDER — FENTANYL CITRATE (PF) 100 MCG/2ML IJ SOLN
INTRAMUSCULAR | Status: AC
  Filled 2022-10-17: qty 2

## 2022-10-17 MED ORDER — OXYTOCIN-SODIUM CHLORIDE 30-0.9 UT/500ML-% IV SOLN
INTRAVENOUS | Status: DC | PRN
  Administered 2022-10-17: 500 mL via INTRAVENOUS

## 2022-10-17 MED ORDER — SODIUM CHLORIDE 0.9 % IV SOLN
INTRAVENOUS | Status: DC | PRN
  Administered 2022-10-17: 500 mg via INTRAVENOUS

## 2022-10-17 MED ORDER — ONDANSETRON HCL 4 MG/2ML IJ SOLN
INTRAMUSCULAR | Status: AC
  Filled 2022-10-17: qty 2

## 2022-10-17 MED ORDER — SODIUM CHLORIDE 0.9% IV SOLUTION
Freq: Once | INTRAVENOUS | Status: AC
  Administered 2022-10-17: 10 mL via INTRAVENOUS

## 2022-10-17 MED ORDER — LACTATED RINGERS IV SOLN: INTRAVENOUS | Status: DC

## 2022-10-17 MED ORDER — SODIUM CHLORIDE 0.9 % IV SOLN: INTRAVENOUS | Status: DC | PRN

## 2022-10-17 MED ORDER — NALOXONE HCL 0.4 MG/ML IJ SOLN: 0.4000 mg | INTRAMUSCULAR | Status: DC | PRN

## 2022-10-17 MED ORDER — ROCURONIUM BROMIDE 10 MG/ML (PF) SYRINGE
PREFILLED_SYRINGE | INTRAVENOUS | Status: AC
  Filled 2022-10-17: qty 10

## 2022-10-17 MED ORDER — PHENYLEPHRINE 80 MCG/ML (10ML) SYRINGE FOR IV PUSH (FOR BLOOD PRESSURE SUPPORT)
PREFILLED_SYRINGE | INTRAVENOUS | Status: DC | PRN
  Administered 2022-10-17 (×2): 160 ug via INTRAVENOUS

## 2022-10-17 MED ORDER — PRENATAL MULTIVITAMIN CH
1.0000 | ORAL_TABLET | Freq: Every day | ORAL | Status: DC
  Administered 2022-10-18 – 2022-10-21 (×4): 1 via ORAL
  Filled 2022-10-17 (×4): qty 1

## 2022-10-17 MED ORDER — PROPOFOL 10 MG/ML IV BOLUS
INTRAVENOUS | Status: DC | PRN
Start: 2022-10-17 — End: 2022-10-17
  Administered 2022-10-17: 200 mg via INTRAVENOUS

## 2022-10-17 MED ORDER — TETANUS-DIPHTH-ACELL PERTUSSIS 5-2.5-18.5 LF-MCG/0.5 IM SUSY: 0.5000 mL | PREFILLED_SYRINGE | Freq: Once | INTRAMUSCULAR | Status: DC

## 2022-10-17 MED ORDER — ALBUMIN HUMAN 5 % IV SOLN: INTRAVENOUS | Status: DC | PRN

## 2022-10-17 MED ORDER — OXYCODONE HCL 5 MG PO TABS
5.0000 mg | ORAL_TABLET | ORAL | Status: DC | PRN
  Administered 2022-10-17 – 2022-10-18 (×3): 5 mg via ORAL
  Filled 2022-10-17 (×2): qty 1

## 2022-10-17 MED ORDER — DIPHENHYDRAMINE HCL 25 MG PO CAPS: 25.0000 mg | ORAL_CAPSULE | Freq: Four times a day (QID) | ORAL | Status: DC | PRN

## 2022-10-17 MED ORDER — ONDANSETRON HCL 4 MG PO TABS: 4.0000 mg | ORAL_TABLET | ORAL | Status: DC | PRN

## 2022-10-17 MED ORDER — ZOLPIDEM TARTRATE 5 MG PO TABS: 5.0000 mg | ORAL_TABLET | Freq: Every evening | ORAL | Status: DC | PRN

## 2022-10-17 MED ORDER — ACETAMINOPHEN 325 MG PO TABS: 325.0000 mg | ORAL_TABLET | Freq: Once | ORAL | Status: DC | PRN

## 2022-10-17 MED ORDER — PROMETHAZINE HCL 25 MG/ML IJ SOLN: 6.2500 mg | INTRAMUSCULAR | Status: DC | PRN

## 2022-10-17 MED ORDER — BENZOCAINE-MENTHOL 20-0.5 % EX AERO: 1.0000 | INHALATION_SPRAY | CUTANEOUS | Status: DC | PRN

## 2022-10-17 MED ORDER — DROPERIDOL 2.5 MG/ML IJ SOLN: 0.6250 mg | Freq: Once | INTRAMUSCULAR | Status: DC | PRN

## 2022-10-17 MED ORDER — MIDAZOLAM HCL 2 MG/2ML IJ SOLN
INTRAMUSCULAR | Status: AC
  Filled 2022-10-17: qty 2

## 2022-10-17 MED ORDER — HYDROMORPHONE HCL 1 MG/ML IJ SOLN
INTRAMUSCULAR | Status: AC
  Filled 2022-10-17: qty 1

## 2022-10-17 MED ORDER — HYDROMORPHONE 1 MG/ML IV SOLN
INTRAVENOUS | Status: DC
  Filled 2022-10-17: qty 30

## 2022-10-17 MED ORDER — CEFAZOLIN SODIUM-DEXTROSE 2-3 GM-%(50ML) IV SOLR
INTRAVENOUS | Status: DC | PRN
Start: 2022-10-17 — End: 2022-10-17
  Administered 2022-10-17: 2 g via INTRAVENOUS

## 2022-10-17 MED ORDER — ACETAMINOPHEN 160 MG/5ML PO SOLN: 325.0000 mg | Freq: Once | ORAL | Status: DC | PRN

## 2022-10-17 MED ORDER — DIPHENOXYLATE-ATROPINE 2.5-0.025 MG PO TABS
2.0000 | ORAL_TABLET | Freq: Once | ORAL | Status: AC
  Administered 2022-10-17: 2 via ORAL
  Filled 2022-10-17 (×2): qty 2

## 2022-10-17 MED ORDER — CEFAZOLIN SODIUM-DEXTROSE 2-4 GM/100ML-% IV SOLN
INTRAVENOUS | Status: AC
  Filled 2022-10-17: qty 100

## 2022-10-17 MED ORDER — SODIUM CHLORIDE 0.9% FLUSH: 9.0000 mL | INTRAVENOUS | Status: DC | PRN

## 2022-10-17 MED ORDER — WITCH HAZEL-GLYCERIN EX PADS: 1.0000 | MEDICATED_PAD | CUTANEOUS | Status: DC | PRN

## 2022-10-17 MED ORDER — METHYLERGONOVINE MALEATE 0.2 MG PO TABS: 0.2000 mg | ORAL_TABLET | ORAL | Status: DC | PRN

## 2022-10-17 MED ORDER — HYDROMORPHONE HCL 1 MG/ML IJ SOLN
INTRAMUSCULAR | Status: DC | PRN
  Administered 2022-10-17: 1 mg via INTRAVENOUS

## 2022-10-17 MED ORDER — DIPHENHYDRAMINE HCL 12.5 MG/5ML PO ELIX: 12.5000 mg | ORAL_SOLUTION | Freq: Four times a day (QID) | ORAL | Status: DC | PRN

## 2022-10-17 MED ORDER — ACETAMINOPHEN 10 MG/ML IV SOLN
INTRAVENOUS | Status: DC | PRN
Start: 2022-10-17 — End: 2022-10-17
  Administered 2022-10-17: 1000 mg via INTRAVENOUS

## 2022-10-17 MED ORDER — CARBOPROST TROMETHAMINE 250 MCG/ML IM SOLN
INTRAMUSCULAR | Status: AC
  Filled 2022-10-17: qty 1

## 2022-10-17 MED ORDER — SUCCINYLCHOLINE CHLORIDE 200 MG/10ML IV SOSY
PREFILLED_SYRINGE | INTRAVENOUS | Status: DC | PRN
  Administered 2022-10-17: 160 mg via INTRAVENOUS

## 2022-10-17 MED ORDER — ONDANSETRON HCL 4 MG/2ML IJ SOLN: 4.0000 mg | INTRAMUSCULAR | Status: DC | PRN

## 2022-10-17 MED ORDER — MEPERIDINE HCL 25 MG/ML IJ SOLN: 6.2500 mg | INTRAMUSCULAR | Status: DC | PRN

## 2022-10-17 MED ORDER — FENTANYL CITRATE (PF) 100 MCG/2ML IJ SOLN
25.0000 ug | INTRAMUSCULAR | Status: DC | PRN
  Administered 2022-10-17 (×3): 50 ug via INTRAVENOUS

## 2022-10-17 MED ORDER — TRANEXAMIC ACID-NACL 1000-0.7 MG/100ML-% IV SOLN
INTRAVENOUS | Status: DC | PRN
  Administered 2022-10-17 (×2): 1000 mg via INTRAVENOUS

## 2022-10-17 MED ORDER — DIBUCAINE (PERIANAL) 1 % EX OINT: 1.0000 | TOPICAL_OINTMENT | CUTANEOUS | Status: DC | PRN

## 2022-10-17 MED ORDER — COCONUT OIL OIL: 1.0000 | TOPICAL_OIL | Status: DC | PRN

## 2022-10-17 MED ORDER — TRANEXAMIC ACID-NACL 1000-0.7 MG/100ML-% IV SOLN
INTRAVENOUS | Status: AC
  Filled 2022-10-17: qty 100

## 2022-10-17 MED ORDER — LACTATED RINGERS IV SOLN
INTRAVENOUS | Status: DC | PRN
Start: 2022-10-17 — End: 2022-10-17

## 2022-10-17 MED ORDER — FENTANYL CITRATE (PF) 250 MCG/5ML IJ SOLN
INTRAMUSCULAR | Status: DC | PRN
  Administered 2022-10-17: 150 ug via INTRAVENOUS
  Administered 2022-10-17 (×2): 50 ug via INTRAVENOUS

## 2022-10-17 MED ORDER — DIPHENHYDRAMINE HCL 50 MG/ML IJ SOLN: 12.5000 mg | Freq: Four times a day (QID) | INTRAMUSCULAR | Status: DC | PRN

## 2022-10-17 MED ORDER — METHYLERGONOVINE MALEATE 0.2 MG/ML IJ SOLN
INTRAMUSCULAR | Status: DC | PRN
  Administered 2022-10-17: .2 mg via INTRAMUSCULAR

## 2022-10-17 MED ORDER — DEXAMETHASONE SODIUM PHOSPHATE 10 MG/ML IJ SOLN
INTRAMUSCULAR | Status: DC | PRN
Start: 2022-10-17 — End: 2022-10-17
  Administered 2022-10-17: 10 mg via INTRAVENOUS

## 2022-10-17 MED ORDER — METHYLERGONOVINE MALEATE 0.2 MG/ML IJ SOLN
INTRAMUSCULAR | Status: AC
  Filled 2022-10-17: qty 1

## 2022-10-17 MED ORDER — ACETAMINOPHEN 325 MG PO TABS
650.0000 mg | ORAL_TABLET | ORAL | Status: DC
  Administered 2022-10-17 – 2022-10-19 (×2): 650 mg via ORAL
  Administered 2022-10-19: 325 mg via ORAL
  Administered 2022-10-19 – 2022-10-20 (×3): 650 mg via ORAL
  Filled 2022-10-17 (×9): qty 2

## 2022-10-17 MED ORDER — SODIUM CHLORIDE 0.9% IV SOLUTION: Freq: Once | INTRAVENOUS | Status: DC

## 2022-10-17 MED ORDER — ONDANSETRON HCL 4 MG/2ML IJ SOLN
INTRAMUSCULAR | Status: DC | PRN
  Administered 2022-10-17: 4 mg via INTRAVENOUS

## 2022-10-17 SURGICAL SUPPLY — 34 items
ADH SKN CLS APL DERMABOND .7 (GAUZE/BANDAGES/DRESSINGS) ×1
ADH SKN CLS LQ APL DERMABOND (GAUZE/BANDAGES/DRESSINGS) ×1
CANISTER SUCT 3000ML PPV (MISCELLANEOUS) ×1 IMPLANT
CHLORAPREP W/TINT 26ML (MISCELLANEOUS) ×2 IMPLANT
CLAMP UMBILICAL CORD (MISCELLANEOUS) ×2 IMPLANT
CLOTH BEACON ORANGE TIMEOUT ST (SAFETY) ×2 IMPLANT
DERMABOND ADVANCED .7 DNX12 (GAUZE/BANDAGES/DRESSINGS) ×1 IMPLANT
DERMABOND ADVANCED .7 DNX6 (GAUZE/BANDAGES/DRESSINGS) IMPLANT
DRAPE SHEET LG 3/4 BI-LAMINATE (DRAPES) ×1 IMPLANT
DRSG OPSITE POSTOP 4X10 (GAUZE/BANDAGES/DRESSINGS) ×1 IMPLANT
ELECT REM PT RETURN 9FT ADLT (ELECTROSURGICAL) ×1
ELECTRODE REM PT RTRN 9FT ADLT (ELECTROSURGICAL) ×1 IMPLANT
EXTRACTOR VACUUM KIWI (MISCELLANEOUS) ×1 IMPLANT
GAUZE SPONGE 4X4 12PLY STRL LF (GAUZE/BANDAGES/DRESSINGS) IMPLANT
GLOVE BIOGEL PI IND STRL 7.0 (GLOVE) ×3 IMPLANT
GLOVE ECLIPSE 6.5 STRL STRAW (GLOVE) ×1 IMPLANT
GOWN STRL REUS W/ TWL LRG LVL3 (GOWN DISPOSABLE) ×2 IMPLANT
GOWN STRL REUS W/TWL LRG LVL3 (GOWN DISPOSABLE) ×2
NS IRRIG 1000ML POUR BTL (IV SOLUTION) ×1 IMPLANT
PAD ABD 8X10 STRL (GAUZE/BANDAGES/DRESSINGS) IMPLANT
PAD OB MATERNITY 4.3X12.25 (PERSONAL CARE ITEMS) ×1 IMPLANT
PAD PREP 24X48 CUFFED NSTRL (MISCELLANEOUS) ×1 IMPLANT
RETRACTOR WND ALEXIS 25 LRG (MISCELLANEOUS) IMPLANT
RTRCTR WOUND ALEXIS 25CM LRG (MISCELLANEOUS)
SUT MNCRL 0 VIOLET CTX 36 (SUTURE) IMPLANT
SUT MNCRL AB 0 CT1 27 (SUTURE) ×1 IMPLANT
SUT PLAIN 2 0 XLH (SUTURE) ×2 IMPLANT
SUT VIC AB 0 CT1 36 (SUTURE) ×2 IMPLANT
SUT VIC AB 2-0 CT1 27 (SUTURE) ×1
SUT VIC AB 2-0 CT1 TAPERPNT 27 (SUTURE) ×1 IMPLANT
SUT VIC AB 4-0 KS 27 (SUTURE) ×1 IMPLANT
TOWEL OR 17X24 6PK STRL BLUE (TOWEL DISPOSABLE) ×3 IMPLANT
TRAY FOLEY CATH SILVER 16FR (SET/KITS/TRAYS/PACK) ×1 IMPLANT
WATER STERILE IRR 1000ML POUR (IV SOLUTION) ×1 IMPLANT

## 2022-10-17 NOTE — Op Note (Addendum)
Operative Note   Patient: Alaxandra Frieling  Date of Procedure: 10/17/2022  Procedure: Repeat Low Transverse Cesarean with bilateral tubal sterilization  Indications: abruptio placenta, laboring and declined TOLAC  Pre-operative Diagnosis: Previous cesarean section-desires sterilization. Suspected placental abruption, Liver cirrhosis, chronic ITP, splenomegaly.   Post-operative Diagnosis: Bilateral Tubal Sterilization via Bilateral salpingectomy  TOLAC Candidate: No  Surgeon: Surgeons and Role:    * Federico Flake, MD - Primary       * Eure, Amaryllis Dyke, MD - Co-Surgeon   Assistants: Dr Turner Daniels  An experienced assistant was required given the standard of surgical care given the complexity of the case.  This assistant was needed for exposure, dissection, suctioning, retraction, instrument exchange, assisting with delivery with administration of fundal pressure, and for overall help during the procedure.   Anesthesia: general  Anesthesiologist: Shona Simpson MD, Cephus Shelling CRNA  Antibiotics: Azithromycin and Anceph   Estimated Blood Loss: 703 ml   Total IV Fluids: 1000 ml  Urine Output:  100 cc OF clear urine  Specimens: Placenta, bilateral fallopian tubes   Complications: Uterine Atony (received TXA 1000mg  x2, Methergine IM x1, myometrial hemabate, Pitocin bolus, O'Leary Suture bilaterally   Indications: Nariah I Denita Lung is a 37 y.o. G2P0101 with an IUP 100w1d presenting for unscheduled, urgent cesarean secondary to the indications listed above. Clinical course notable for the patient presenting with possible ROM but also frank bleeding with contraction. Placental abruption was the leading diagnosis. Infant was Category I tracing in MAU.   Patient was part of the [facility] Red Chart Rounds and had preoperative planning with an interdisciplinary team.  1 unit of platelets were administered prior to the start of the case per prior planning and based on her thrombocytopenia.     Findings: Viable infant in cephalic presentation, no nuchal cord present. Apgars 8, 8, . Weight 2980 g. Bloody and meconium stained amniotic fluid. Normal placenta, three vessel cord. Normal uterus, Normal bilateral fallopian tubes, Normal bilateral ovaries. No adhesive disease was encountered.  Procedure Details: A Time Out was held and the above information confirmed. The patient received intravenous antibiotics and had sequential compression devices applied to her lower extremities preoperatively. The patient was taken back to the operative suite where spinal anesthesia was administered. After induction of anesthesia, the patient was draped and prepped in the usual sterile manner and placed in a dorsal supine position with a leftward tilt. A low transverse skin incision was made with scalpel and carried down through the subcutaneous tissue to the fascia. Fascial incision was made and extended transversely. The fascia was separated from the underlying rectus tissue superiorly and inferiorly. The rectus muscles were separated in the midline bluntly and the peritoneum was entered bluntly. An Alexis retractor was placed to aid in visualization of the uterus. A bladder flap was not developed. A low transverse uterine incision was made. The infant was successfully delivered from cephalic presentation using a MightyVac vacuum extractor, the umbilical cord was clamped after 1 minute. Cord ph was not sent, and cord blood was obtained for evaluation. The placenta was removed Intact and appeared abnormal - adherent clot . The uterine incision was closed with a single layer running unlocked suture of 0-Monocryl. The uterus was exteriorized to help assess bleeding. Due to ongoing bleeding from the hysterotomy figure of eight sutures of 0 Monocryl were placed after which there was excellent hemostasis.  Dr Turner Daniels performed bilateral Antony Salmon stiches to help with bleeding.   Attention was  turned to bilateral  fallopian tubes. The right fallopian tube was ligated in the typical fashion using a Ligasure device. The left fallopian tube was ligated in a similar fashion. It was noted that on the right there was a bleeding in the mesosaplinx from possible sheering and this was repaired with 0-Monocryl.  The abdomen and the pelvis were cleared of all clot and debris and the Jon Gills was removed. Hemostasis was confirmed on all surfaces.  The peritoneum was reapproximated using 2-0 vicryl . The fascia was then closed using 0 Vicryl in a running fashion. The subcutaneous layer was reapproximated with 2-0 plain gut suture. The skin was closed with a 4-0 vicryl subcuticular stitch. The patient tolerated the procedure well. Sponge, lap, instrument and needle counts were correct x 2. She was taken to the recovery room in stable condition.  Disposition: PACU - hemodynamically stable.    Signed: Federico Flake, MD/MPH Attending Family Medicine Physician, [facility] for [facility], [facility]

## 2022-10-17 NOTE — Lactation Note (Signed)
This note was copied from a baby's chart. Lactation Consultation Note  Patient Name: Megan Fuller [id number] Date: 10/17/2022 Age:37 years Reason for consult: Initial assessment;Early term 37-38.6wks;Maternal endocrine disorder  Visited with family of 4 hours old ETI female; Ms. Lohmeier is a P2 and has some experienced pumping but not with direct breastfeeding. She reports she hasn't tried latching baby on yet because "she doesn't have any milk". Explained that it will take 3-5 days for her milk to come in but that babies can still get droplets of colostrum in the meantime. Assisted with breast massage and hand expression (no colostrum noted yet); she fell asleep during [facility] consult due to her pain medication and will need re-education the next time she sees lactation. Set up a DEBP, instructions, cleaning and storage were reviewed with FOB as well as feeding plan.   Maternal Data Has patient been taught Hand Expression?: Yes Does the patient have breastfeeding experience prior to this delivery?: Yes How long did the patient breastfeed?: 2 months  Feeding Mother's Current Feeding Choice: Breast Milk and Formula Nipple Type: Slow - flow  Lactation Tools Discussed/Used Tools: Pump;Flanges Flange Size: 21 Breast pump type: Double-Electric Breast Pump Pump Education: Setup, frequency, and cleaning;Milk Storage Reason for Pumping: ETI Pumping frequency: whenever baby is getting a bottle  Interventions Interventions: Breast feeding basics reviewed;Breast massage;Hand express;DEBP;Education;LC Services brochure  Plan Encouraged to put baby to breast +8 times/24 hours or sooner if feeding cues are present Pumping whenever baby is getting a bottle was strongly encouraged Parents will continue supplementing with Similac 20 calorie formula per feeding choice on admission; will follow supplementation guidelines per baby's age in hours  FOB present and supportive. All questions and concerns  answered, family to contact [facility] services PRN.  Consult Status Consult Status: Follow-up Date: 10/18/22 Follow-up type: In-patient   Rheya Minogue Venetia Constable 10/17/2022, 4:55 PM

## 2022-10-17 NOTE — H&P (Signed)
Megan Fuller is a 37 y.o. female, G2P0101 at 37.1 weeks, presenting for vaginal bleeding and contractions. Patient receives care at [facility] and was supervised for a high-risk (red chart) pregnancy. Pregnancy and medical history significant for problems as listed below. She is GBS unknown and expresses a desire for a repeat C/S.  She requests BTL for PP birth control method.     Patient Active Problem List   Diagnosis Date Noted   Uninsured 08/09/2022   Thrombocytopenia (HCC) 08/09/2022   Red Chart Rounds Patient 08/02/2022   Macrocytic anemia during pregnancy 07/29/2022   Proteinuria affecting pregnancy in second trimester 05/30/2022   Splenomegaly 05/30/2022   Cirrhosis (HCC) 05/29/2022   Supervision of high risk pregnancy, antepartum 05/28/2022   Obesity in pregnancy, antepartum 05/28/2022   History of preterm delivery, currently pregnant 01/01/2020   History of cesarean section, low transverse 01/01/2020   Chronic ITP (idiopathic thrombocytopenia) (HCC) 10/27/2019   Depression affecting pregnancy in second trimester, antepartum 10/27/2019   PCOS (polycystic ovarian syndrome) 02/10/2014    History of present pregnancy:  Last evaluation:  10/01/2022 in office with Dr. Marcie Bal at 34.6 weeks BP: 129/88  Pulse: 93  Weight: 205 lb 11.2 oz (93.3 kg)     Nursing Staff Provider  Office Location [facility] >[facility] Dating  Early [place]? [facility] records pending  [facility] Model Traditional      Language  Spanish Anatomy US     Flu Vaccine  none Genetic/Carrier Screen  NIPS: declined due to cost AFP:   not done Horizon: declined due to cost  TDaP Vaccine   @[facility]  Hgb A1C or  GTT Early - A1C 4.5 Third trimester - normal  COVID Vaccine none   LAB RESULTS   Rhogam  N/a Blood Type O/Positive/-- (04/02 1158)   Baby Feeding Plan Both Antibody Negative (04/02 1158)  Contraception BTL Rubella 11.00 (04/02 1158)  Circumcision If boy- NO RPR Non Reactive (04/02 1158)   Pediatrician  TAPM  HBsAg Negative (04/02 1158)   Support Person   HCVAb Non Reactive (04/02 1158)   Prenatal Classes   HIV Non Reactive (04/02 1158)     BTL Consent N/a, self pay GBS (For PCN allergy, check sensitivities)   VBAC Consent   Pap       Diagnosis  Date Value Ref Range Status  11/25/2019     Final    - Negative for intraepithelial lesion or malignancy (NILM)             DME Rx [ ]  BP cuff [ ]  Weight Scale Waterbirth  [ ]  Class [ ]  Consent [ ]  CNM visit  PHQ9 & GAD7 [  ] new OB [  ] 28 weeks  [  ] 36 weeks Induction  [ ]  Orders Entered [ ] Foley Y/N     OB History     Gravida  2   Para  1   Term      Preterm  1   AB      Living  1      SAB      IAB      Ectopic      Multiple  0   Live Births  1             Past Medical History:  Diagnosis Date   Cirrhosis of liver (HCC)    Noted on 12/2021 CT scan. LFTs reassuring. Negative Hepatitis evaluation at [facility] in 2018. Patient had  liver issues as a child, biopsy recommended but she her family did not want to do this. No history of alcoholism.   Depression 2021   E.coli UTI (urinary tract infection) during pregnancy, second trimester 10/27/2019   Negative TOC   Gastroesophageal reflux disease 08/09/2022   PCOS (polycystic ovarian syndrome)    PCOS (polycystic ovarian syndrome)    Past Surgical History:  Procedure Laterality Date   CESAREAN SECTION N/A 12/26/2019   Procedure: CESAREAN SECTION;  Surgeon: Teller Bing, MD;  Location: [facility] LD ORS;  Service: Obstetrics;  Laterality: N/A;   ESOPHAGOGASTRODUODENOSCOPY N/A 08/22/2022   Procedure: ESOPHAGOGASTRODUODENOSCOPY (EGD);  Surgeon: Iva Boop, MD;  Location: [facility];  Service: Gastroenterology;  Laterality: N/A;   Family History: family history includes Diabetes in her father and maternal grandmother. Social History:  reports that she has never smoked. She has never used smokeless tobacco. She reports that she does not drink alcohol and does not use  drugs.   Prenatal Transfer Tool  Maternal Diabetes: No Genetic Screening: Declined Maternal Ultrasounds/Referrals: Normal Fetal Ultrasounds or other Referrals:  None Maternal Substance Abuse:  No Significant Maternal Medications:  Meds include: Other: Pepcid, PNV Significant Maternal Lab Results: Other: Plts 49 on 09/16/2022   Maternal Assessment:  ROS: +Contractions, -LOF, +Vaginal Bleeding, +Fetal Movement  All other systems reviewed and negative.    Allergies  Allergen Reactions   Lactose Intolerance (Gi)     bloating       unknown if currently breastfeeding.  Physical Exam Vitals reviewed.  Constitutional:      Appearance: Normal appearance.  HENT:     Head: Normocephalic and atraumatic.  Eyes:     Conjunctiva/sclera: Conjunctivae normal.  Cardiovascular:     Rate and Rhythm: Normal rate.  Pulmonary:     Effort: Pulmonary effort is normal. No respiratory distress.  Genitourinary:    Comments: Sterile Speculum Exam: -Normal External Genitalia: Non tender, blood and mucoid discharge noted at introitus.  -Vaginal Vault: Pink mucosa with good rugae. Moderate amt blood in vault. Removed 2 clots- one half dollar, one quarter sized, from vault with hemostat and sterile gauze.  -Cervix:Unable to visualize -Bimanual Exam:  3/100/-2, Vertex    Musculoskeletal:        General: Normal range of motion.     Cervical back: Normal range of motion.  Skin:    General: Skin is warm and dry.  Neurological:     Mental Status: She is alert and oriented to person, place, and time.  Psychiatric:        Mood and Affect: Mood normal.        Behavior: Behavior normal.     Fetal Assessment: -Presentation: Vertex by Exam FHR: 135 bpm, Mod Var, -Decels, +Accels UCs:  Q63min    Assessment IUP at 37.6 weeks Cat I FT Vaginal Bleeding with H/O Abruption Desires Repeat C/S with BTL Chronic ITP   Plan: Admit to hospital Prepare for OR Dr. Kirtland Bouchard. [place] notified of patient  status and preparing relevant order.  Joellyn Quails, MSN 10/17/2022, 10:25 AM

## 2022-10-17 NOTE — Progress Notes (Signed)
Patient refusing labs for CBC and CMP. Dr. Alvester Morin notified. Patient refusing PCA for pain relief, had previously stated pain was "worst ever." And asked nurse for "stronger medication." RN informed patient that there was a stronger medication scheduled for her and at that time patient desired stronger medication. Upon return to room to set PCA up with additional RN and interpreter, patient now refusing PCA administration. In house interpreter, Erie Noe, utilized for all care and education.

## 2022-10-17 NOTE — Hospital Course (Signed)
, °

## 2022-10-17 NOTE — Anesthesia Procedure Notes (Signed)
Procedure Name: Intubation Date/Time: 10/17/2022 12:32 PM  Performed by: Algis Greenhouse, CRNAPre-anesthesia Checklist: Patient identified, Emergency Drugs available, Suction available and Patient being monitored Patient Re-evaluated:Patient Re-evaluated prior to induction Oxygen Delivery Method: Circle system utilized Preoxygenation: Pre-oxygenation with 100% oxygen Induction Type: IV induction Ventilation: Mask ventilation without difficulty Laryngoscope Size: Mac and 3 Grade View: Grade II Tube size: 7.0 mm Number of attempts: 1 Airway Equipment and Method: Stylet and Video-laryngoscopy Placement Confirmation: ETT inserted through vocal cords under direct vision, positive ETCO2 and breath sounds checked- equal and bilateral Secured at: 21 cm Tube secured with: Tape Dental Injury: Teeth and Oropharynx as per pre-operative assessment

## 2022-10-17 NOTE — Anesthesia Preprocedure Evaluation (Addendum)
Anesthesia Evaluation  Patient identified by MRN, date of birth, ID band Patient awake    Reviewed: Allergy & Precautions, NPO status , Patient's Chart, lab work & pertinent test results  Airway Mallampati: II  TM Distance: >3 FB Neck ROM: Full    Dental  (+) Teeth Intact, Dental Advisory Given   Pulmonary neg pulmonary ROS   breath sounds clear to auscultation       Cardiovascular  Rhythm:Regular Rate:Normal     Neuro/Psych  PSYCHIATRIC DISORDERS  Depression    negative neurological ROS     GI/Hepatic Neg liver ROS,GERD  ,,  Endo/Other  negative endocrine ROS    Renal/GU negative Renal ROS     Musculoskeletal negative musculoskeletal ROS (+)    Abdominal   Peds  Hematology  (+) Blood dyscrasia, anemia   Anesthesia Other Findings   Reproductive/Obstetrics                             Anesthesia Physical Anesthesia Plan  ASA: 3  Anesthesia Plan: General   Post-op Pain Management: Tylenol PO (pre-op)*   Induction: Intravenous  PONV Risk Score and Plan: 4 or greater and Ondansetron and Treatment may vary due to age or medical condition  Airway Management Planned: Oral ETT and Video Laryngoscope Planned  Additional Equipment: None  Intra-op Plan:   Post-operative Plan: Extubation in OR  Informed Consent: I have reviewed the patients History and Physical, chart, labs and discussed the procedure including the risks, benefits and alternatives for the proposed anesthesia with the patient or authorized representative who has indicated his/her understanding and acceptance.     Dental advisory given  Plan Discussed with: CRNA  Anesthesia Plan Comments: (Possible arterial line depending on hemodynamics and blood loss.)       Anesthesia Quick Evaluation

## 2022-10-17 NOTE — Transfer of Care (Signed)
Immediate Anesthesia Transfer of Care Note  Patient: Megan Fuller  Procedure(s) Performed: CESAREAN SECTION WITH BILATERAL TUBAL LIGATION APPLICATION OF CELL SAVER  Patient Location: PACU  Anesthesia Type:General  Level of Consciousness: awake and drowsy  Airway & Oxygen Therapy: Patient Spontanous Breathing  Post-op Assessment: Report given to RN and Post -op Vital signs reviewed and stable  Post vital signs: Reviewed and stable  Last Vitals:  Vitals Value Taken Time  BP 129/83 10/17/22 1404  Temp 36.5 C 10/17/22 1404  Pulse 80 10/17/22 1406  Resp 16 10/17/22 1406  SpO2 100 % 10/17/22 1406  Vitals shown include unfiled device data.  Last Pain:  Vitals:   10/17/22 1404  TempSrc: Oral  PainSc:          Complications: No notable events documented.

## 2022-10-17 NOTE — Anesthesia Postprocedure Evaluation (Signed)
Anesthesia Post Note  Patient: Megan Fuller  Procedure(s) Performed: CESAREAN SECTION WITH BILATERAL TUBAL LIGATION APPLICATION OF CELL SAVER     Patient location during evaluation: PACU Anesthesia Type: General Level of consciousness: awake and alert Pain management: pain level controlled Vital Signs Assessment: post-procedure vital signs reviewed and stable Respiratory status: spontaneous breathing, nonlabored ventilation and respiratory function stable Cardiovascular status: blood pressure returned to baseline Postop Assessment: no apparent nausea or vomiting Anesthetic complications: no   No notable events documented.  Last Vitals:  Vitals:   10/17/22 1515 10/17/22 1530  BP: (!) 143/87 (!) 150/93  Pulse: 65 64  Resp: 10 18  Temp:  36.4 C  SpO2: 99% 99%    Last Pain:  Vitals:   10/17/22 1530  TempSrc: Oral  PainSc: 6                  Megan Fuller

## 2022-10-18 ENCOUNTER — Ambulatory Visit: Payer: Self-pay

## 2022-10-18 ENCOUNTER — Encounter (HOSPITAL_COMMUNITY): Payer: Self-pay | Admitting: Family Medicine

## 2022-10-18 ENCOUNTER — Other Ambulatory Visit: Payer: Self-pay

## 2022-10-18 LAB — CBC WITH DIFFERENTIAL/PLATELET
Abs Immature Granulocytes: 0.23 10*3/uL — ABNORMAL HIGH (ref 0.00–0.07)
Basophils Absolute: 0 10*3/uL (ref 0.0–0.1)
Basophils Relative: 0 %
Eosinophils Absolute: 0 10*3/uL (ref 0.0–0.5)
Eosinophils Relative: 0 %
HCT: 25.5 % — ABNORMAL LOW (ref 36.0–46.0)
Hemoglobin: 8.8 g/dL — ABNORMAL LOW (ref 12.0–15.0)
Immature Granulocytes: 1 %
Lymphocytes Relative: 4 %
Lymphs Abs: 0.8 10*3/uL (ref 0.7–4.0)
MCH: 34.1 pg — ABNORMAL HIGH (ref 26.0–34.0)
MCHC: 34.5 g/dL (ref 30.0–36.0)
MCV: 98.8 fL (ref 80.0–100.0)
Monocytes Absolute: 1.2 10*3/uL — ABNORMAL HIGH (ref 0.1–1.0)
Monocytes Relative: 7 %
Neutro Abs: 15.8 10*3/uL — ABNORMAL HIGH (ref 1.7–7.7)
Neutrophils Relative %: 88 %
Platelets: 79 10*3/uL — ABNORMAL LOW (ref 150–400)
RBC: 2.58 MIL/uL — ABNORMAL LOW (ref 3.87–5.11)
RDW: 15.7 % — ABNORMAL HIGH (ref 11.5–15.5)
WBC: 18.1 10*3/uL — ABNORMAL HIGH (ref 4.0–10.5)
nRBC: 0.1 % (ref 0.0–0.2)

## 2022-10-18 LAB — CBC
HCT: 25.1 % — ABNORMAL LOW (ref 36.0–46.0)
Hemoglobin: 8.7 g/dL — ABNORMAL LOW (ref 12.0–15.0)
MCH: 33.7 pg (ref 26.0–34.0)
MCHC: 34.7 g/dL (ref 30.0–36.0)
MCV: 97.3 fL (ref 80.0–100.0)
Platelets: 62 10*3/uL — ABNORMAL LOW (ref 150–400)
RBC: 2.58 MIL/uL — ABNORMAL LOW (ref 3.87–5.11)
RDW: 14.6 % (ref 11.5–15.5)
WBC: 14.7 10*3/uL — ABNORMAL HIGH (ref 4.0–10.5)
nRBC: 0 % (ref 0.0–0.2)

## 2022-10-18 LAB — PREPARE PLATELET PHERESIS: Unit division: 0

## 2022-10-18 LAB — FIBRINOGEN: Fibrinogen: 187 mg/dL — ABNORMAL LOW (ref 210–475)

## 2022-10-18 LAB — BPAM PLATELET PHERESIS
Blood Product Expiration Date: 202408232359
ISSUE DATE / TIME: 202408221127
Unit Type and Rh: 6200

## 2022-10-18 MED ORDER — FLEET ENEMA RE ENEM
1.0000 | ENEMA | Freq: Every day | RECTAL | Status: DC | PRN
  Administered 2022-10-18: 1 via RECTAL

## 2022-10-18 MED ORDER — MENTHOL 3 MG MT LOZG
1.0000 | LOZENGE | OROMUCOSAL | Status: DC | PRN
  Administered 2022-10-18: 3 mg via ORAL
  Filled 2022-10-18: qty 9

## 2022-10-18 MED ORDER — BISACODYL 10 MG RE SUPP: 10.0000 mg | Freq: Every day | RECTAL | Status: DC | PRN

## 2022-10-18 NOTE — Plan of Care (Signed)

## 2022-10-18 NOTE — Lactation Note (Signed)
This note was copied from a baby's chart. Lactation Consultation Note  Patient Name: Megan Fuller [id number] Date: 10/18/2022 Age:35 hours Reason for consult: Follow-up assessment;Maternal endocrine disorder;Early term 37-38.6wks  LC in to visit with P2 Mom of ET infant.  Baby is at a 5.7% weight loss.  Baby is latching to the breast and then getting formula by bottle.  Mom is convinced she doesn't have any breast milk.    Reviewed importance of baby being offered the breast, and asking for help prn.  Mom has a DEBP set up in room, but hasn't pumped yet.  She doesn't like the double pump and prefers the hand pump.  Talked about the importance of stimulating her milk supply with regular pumping whenever baby is supplemented.    Reviewed hand expression, unable to express any colostrum.  Mom said she didn't have much with her last baby (NICU admission for very preterm)  Lactation Tools Discussed/Used Tools: Pump;Flanges;Bottle Flange Size: 21 Breast pump type: Double-Electric Breast Pump;Manual Reason for Pumping: support milk supply Pumping frequency: Mom doesn't want to pump, hasn't pumped one time  Interventions Interventions: Skin to skin;Breast massage;Pre-pump if needed;Hand express;Hand pump;DEBP  Discharge Pump: Manual WIC Program: No  Consult Status Consult Status: Follow-up Date: 10/19/22 Follow-up type: In-patient    [name] 10/18/2022, 1:34 PM

## 2022-10-18 NOTE — Progress Notes (Signed)
Patient ID: Megan Fuller, female   DOB: Feb 07, 1986, 37 y.o.   MRN: [id number] 1 Day Post-Op Procedure(s) (LRB): CESAREAN SECTION WITH BILATERAL TUBAL LIGATION (N/A) APPLICATION OF CELL SAVER (N/A)  Megan Fuller is a 37 y.o. female patient.   1. Thrombocytopenia (HCC)   2. History of cesarean section, low transverse   3. Red Chart Rounds Patient   4. S/P repeat low transverse C-section   5. Uninsured   6. Macrocytic anemia during pregnancy   7. Splenomegaly   8. Proteinuria affecting pregnancy in second trimester   9. Cirrhosis of liver without ascites, unspecified hepatic cirrhosis type (HCC)   10. Obesity in pregnancy, antepartum   11. Supervision of high risk pregnancy, antepartum   12. History of preterm delivery, currently pregnant   46. Depression affecting pregnancy in second trimester, antepartum   14. Chronic ITP (idiopathic thrombocytopenia) (HCC)   15. PCOS (polycystic ovarian syndrome)     Past Medical History:  Diagnosis Date   Cirrhosis of liver (HCC)    Noted on 12/2021 CT scan. LFTs reassuring. Negative Hepatitis evaluation at [facility] in 2018. Patient had liver issues as a child, biopsy recommended but she her family did not want to do this. No history of alcoholism.   Depression 2021   E.coli UTI (urinary tract infection) during pregnancy, second trimester 10/27/2019   Negative TOC   Gastroesophageal reflux disease 08/09/2022   PCOS (polycystic ovarian syndrome)    PCOS (polycystic ovarian syndrome)     No past surgical history pertinent negatives on file.  Scheduled Meds:  sodium chloride   Intravenous Once   acetaminophen  650 mg Oral Q4H   prenatal multivitamin  1 tablet Oral Q1200   senna-docusate  2 tablet Oral Daily   Tdap  0.5 mL Intramuscular Once    Continuous Infusions:  PRN Meds:benzocaine-Menthol, coconut oil, witch hazel-glycerin **AND** dibucaine, diphenhydrAMINE, menthol-cetylpyridinium, methylergonovine, ondansetron **OR**  ondansetron (ZOFRAN) IV, oxyCODONE, oxyCODONE, simethicone, zolpidem  Allergies  Allergen Reactions   Lactose Intolerance (Gi)     bloating    Principal Problem:   S/P repeat low transverse C-section   Subjective   Pt without complaints All questions answered Reports 1 blood clot otherwise not much bleeding  Objective   Vitals:   10/17/22 2318 10/18/22 0500 10/18/22 0607 10/18/22 0750  BP: 130/75 130/60 128/82 117/82  Pulse: 75  84 97  Resp: 18  18 20   Temp: 99 F (37.2 C)  98.4 F (36.9 C) 98 F (36.7 C)  TempSrc: Oral   Oral  SpO2:    97%   Vitals:   10/17/22 1415 10/17/22 1430 10/17/22 1445 10/17/22 1502  BP: (!) 135/94 (!) 127/90 (!) 134/90 (!) 144/83   10/17/22 1515 10/17/22 1530 10/17/22 1557 10/17/22 1851  BP: (!) 143/87 (!) 150/93 121/83 (!) 137/95   10/17/22 2318 10/18/22 0500 10/18/22 0607 10/18/22 0750  BP: 130/75 130/60 128/82 117/82     Subjective Objective: Vital signs (most recent): Blood pressure 117/82, pulse 97, temperature 98 F (36.7 C), temperature source Oral, resp. rate 20, SpO2 97%, unknown if currently breastfeeding.   Gen  WDWN NAD Abdomen  soft pressure dressing dry normal post op Incision  under pressure dressing     Latest Ref Rng & Units 10/18/2022    5:11 AM 10/17/2022   10:46 AM 09/16/2022    4:26 PM  CBC  WBC 4.0 - 10.5 K/uL 14.7  6.3  8.0   Hemoglobin 12.0 - 15.0 g/dL  8.7  11.1  10.7   Hematocrit 36.0 - 46.0 % 25.1  32.4  30.8   Platelets 150 - 400 K/uL 62  55  49        Latest Ref Rng & Units 10/17/2022   10:46 AM 09/16/2022    4:26 PM 08/09/2022   12:36 PM  CMP  Glucose 70 - 99 mg/dL 76  83  76   BUN 6 - 20 mg/dL 5  <5  5   Creatinine 2.13 - 1.00 mg/dL 0.86  5.78  4.69   Sodium 135 - 145 mmol/L 137  130  138   Potassium 3.5 - 5.1 mmol/L 3.8  3.7  3.8   Chloride 98 - 111 mmol/L 108  105  108   CO2 22 - 32 mmol/L 20  18  24    Calcium 8.9 - 10.3 mg/dL 8.4  8.0  8.2   Total Protein 6.5 - 8.1 g/dL 5.9  6.1  6.3    Total Bilirubin 0.3 - 1.2 mg/dL 3.6  4.5  2.3   Alkaline Phos 38 - 126 U/L 165  139  102   AST 15 - 41 U/L 37  36  31   ALT 0 - 44 U/L 13  14  11      Fibrinogen 158 yesterday afternoon  Assessment & Plan POD #1 repeat LTCS with ligasure bilateral salpingectomy, O'Leary suture placement bilaterally intra operative TXA x 2, methergine series and intra myometrial hemabate  Hemoglobin is as expected this am, platelets reasonable Will repeat CBC + fibrinogen at 1600 to track BP stable, will watch Pain controlled on oxycodone 5 mg  Lazaro Arms, MD 10/18/2022, 10:14 AM

## 2022-10-19 ENCOUNTER — Other Ambulatory Visit: Payer: Self-pay

## 2022-10-19 ENCOUNTER — Encounter (HOSPITAL_COMMUNITY): Payer: Self-pay | Admitting: Family Medicine

## 2022-10-19 NOTE — Progress Notes (Signed)
Patient ID: Megan Fuller, female   DOB: Feb 19, 1986, 37 y.o.   MRN: [id number] 2 Days Post-Op Procedure(s) (LRB): CESAREAN SECTION WITH BILATERAL TUBAL LIGATION (N/A) APPLICATION OF CELL SAVER (N/A) with cirrhosis, coagulopathy and ITP with thrombocytopenia  Megan Fuller is a 37 y.o. female patient.   1. Thrombocytopenia (HCC)   2. History of cesarean section, low transverse   3. Red Chart Rounds Patient   4. S/P repeat low transverse C-section   5. Uninsured   6. Macrocytic anemia during pregnancy   7. Splenomegaly   8. Proteinuria affecting pregnancy in second trimester   9. Cirrhosis of liver without ascites, unspecified hepatic cirrhosis type (HCC)   10. Obesity in pregnancy, antepartum   11. Supervision of high risk pregnancy, antepartum   12. History of preterm delivery, currently pregnant   32. Depression affecting pregnancy in second trimester, antepartum   14. Chronic ITP (idiopathic thrombocytopenia) (HCC)   15. PCOS (polycystic ovarian syndrome)     Past Medical History:  Diagnosis Date   Cirrhosis of liver (HCC)    Noted on 12/2021 CT scan. LFTs reassuring. Negative Hepatitis evaluation at [facility] in 2018. Patient had liver issues as a child, biopsy recommended but she her family did not want to do this. No history of alcoholism.   Depression 2021   E.coli UTI (urinary tract infection) during pregnancy, second trimester 10/27/2019   Negative TOC   Gastroesophageal reflux disease 08/09/2022   PCOS (polycystic ovarian syndrome)    PCOS (polycystic ovarian syndrome)     No past surgical history pertinent negatives on file.  Scheduled Meds:  sodium chloride   Intravenous Once   acetaminophen  650 mg Oral Q4H   prenatal multivitamin  1 tablet Oral Q1200   senna-docusate  2 tablet Oral Daily   Tdap  0.5 mL Intramuscular Once    Continuous Infusions:  PRN Meds:benzocaine-Menthol, bisacodyl, coconut oil, witch hazel-glycerin **AND** dibucaine,  diphenhydrAMINE, menthol-cetylpyridinium, methylergonovine, ondansetron **OR** ondansetron (ZOFRAN) IV, oxyCODONE, oxyCODONE, simethicone, sodium phosphate, zolpidem  Allergies  Allergen Reactions   Lactose Intolerance (Gi)     bloating    Principal Problem:   S/P repeat low transverse C-section   Subjective   Pt is sluggish to get up and move, scared of hurting and tearing her surgery Gassy  No NVD  Objective   Vitals:   10/19/22 0411 10/19/22 0750 10/19/22 0948 10/19/22 1110  BP: 121/72 124/79  124/73  Pulse: 89 76  87  Resp: 16 20  18   Temp: 98.3 F (36.8 C) 98.2 F (36.8 C)  98.3 F (36.8 C)  TempSrc: Oral Oral  Oral  SpO2: 96%   99%  Weight:   93 kg   Height:   5\' 5"  (1.651 m)    Vitals:   10/17/22 1851 10/17/22 2318 10/18/22 0500 10/18/22 0607  BP: (!) 137/95 130/75 130/60 128/82   10/18/22 0750 10/18/22 1115 10/18/22 1527 10/18/22 1939  BP: 117/82 (!) 142/86 132/85 136/75   10/18/22 2340 10/19/22 0411 10/19/22 0750 10/19/22 1110  BP: 132/82 121/72 124/79 124/73     Subjective Objective: Vital signs (most recent): Blood pressure 124/73, pulse 87, temperature 98.3 F (36.8 C), temperature source Oral, resp. rate 18, height 5\' 5"  (1.651 m), weight 93 kg, SpO2 99%, unknown if currently breastfeeding.   Gen  WDWN NAD Abdomen  soft normal post op  Incision  dressing is dry     Latest Ref Rng & Units 10/18/2022    4:01  PM 10/18/2022    5:11 AM 10/17/2022   10:46 AM  CBC  WBC 4.0 - 10.5 K/uL 18.1  14.7  6.3   Hemoglobin 12.0 - 15.0 g/dL 8.8  8.7  13.2   Hematocrit 36.0 - 46.0 % 25.5  25.1  32.4   Platelets 150 - 400 K/uL 79  62  55        Latest Ref Rng & Units 10/17/2022   10:46 AM 09/16/2022    4:26 PM 08/09/2022   12:36 PM  CMP  Glucose 70 - 99 mg/dL 76  83  76   BUN 6 - 20 mg/dL 5  <5  5   Creatinine 4.40 - 1.00 mg/dL 1.02  7.25  3.66   Sodium 135 - 145 mmol/L 137  130  138   Potassium 3.5 - 5.1 mmol/L 3.8  3.7  3.8   Chloride 98 - 111 mmol/L 108   105  108   CO2 22 - 32 mmol/L 20  18  24    Calcium 8.9 - 10.3 mg/dL 8.4  8.0  8.2   Total Protein 6.5 - 8.1 g/dL 5.9  6.1  6.3   Total Bilirubin 0.3 - 1.2 mg/dL 3.6  4.5  2.3   Alkaline Phos 38 - 126 U/L 165  139  102   AST 15 - 41 U/L 37  36  31   ALT 0 - 44 U/L 13  14  11       Assessment & Plan POD#2 Repeat LTCS with bilateral salpingectomy  Doing overall well Labs have nadired and heading back up slowly but stable at least, re check in am Anticipated discharge tomorrow  Lazaro Arms, MD 10/19/2022, 12:24 PM

## 2022-10-20 LAB — FIBRINOGEN: Fibrinogen: 175 mg/dL — ABNORMAL LOW (ref 210–475)

## 2022-10-20 LAB — CBC
HCT: 19.8 % — ABNORMAL LOW (ref 36.0–46.0)
HCT: 27.3 % — ABNORMAL LOW (ref 36.0–46.0)
Hemoglobin: 6.7 g/dL — CL (ref 12.0–15.0)
Hemoglobin: 9.4 g/dL — ABNORMAL LOW (ref 12.0–15.0)
MCH: 34.4 pg — ABNORMAL HIGH (ref 26.0–34.0)
MCH: 34.1 pg — ABNORMAL HIGH (ref 26.0–34.0)
MCHC: 33.8 g/dL (ref 30.0–36.0)
MCHC: 34.4 g/dL (ref 30.0–36.0)
MCV: 101.5 fL — ABNORMAL HIGH (ref 80.0–100.0)
MCV: 98.9 fL (ref 80.0–100.0)
Platelets: 45 10*3/uL — ABNORMAL LOW (ref 150–400)
Platelets: 66 10*3/uL — ABNORMAL LOW (ref 150–400)
RBC: 1.95 MIL/uL — ABNORMAL LOW (ref 3.87–5.11)
RBC: 2.76 MIL/uL — ABNORMAL LOW (ref 3.87–5.11)
RDW: 16.4 % — ABNORMAL HIGH (ref 11.5–15.5)
RDW: 17.1 % — ABNORMAL HIGH (ref 11.5–15.5)
WBC: 4.5 10*3/uL (ref 4.0–10.5)
WBC: 7.1 10*3/uL (ref 4.0–10.5)
nRBC: 0 % (ref 0.0–0.2)
nRBC: 0 % (ref 0.0–0.2)

## 2022-10-20 LAB — PREPARE RBC (CROSSMATCH)

## 2022-10-20 MED ORDER — DEXAMETHASONE 6 MG PO TABS
40.0000 mg | ORAL_TABLET | Freq: Every day | ORAL | Status: DC
  Administered 2022-10-20 – 2022-10-21 (×2): 40 mg via ORAL
  Filled 2022-10-20 (×2): qty 1

## 2022-10-20 MED ORDER — SODIUM CHLORIDE 0.9% IV SOLUTION: Freq: Once | INTRAVENOUS | Status: AC

## 2022-10-20 NOTE — Plan of Care (Signed)
°  Problem: Clinical Measurements: °Goal: Ability to maintain clinical measurements within normal limits will improve °Outcome: Progressing °Goal: Will remain free from infection °Outcome: Progressing °Goal: Diagnostic test results will improve °Outcome: Progressing °Goal: Respiratory complications will improve °Outcome: Progressing °  °

## 2022-10-20 NOTE — Lactation Note (Signed)
This note was copied from a baby's chart. Lactation Consultation Note  Patient Name: Megan Fuller RUEAV'[id number] Date: 10/20/2022 Age:35 hours Reason for consult: Follow-up assessment;Maternal endocrine disorder;Engorgement;Difficult latch;Early term 37-38.6wks;Infant weight loss;Breastfeeding assistance (8 % weight loss) OB[facility] RN Kandice Hams called this LC and mentioned this mom is engorged and at 1st refused to pump and then agreed to. Refused ice prior to pumping.  LC visited mom , offered to assess her breast and mom had pumped off 7 ml and is still borderline engorged with areola edema,  LC recommended for mom to ice for 20 mins and then pump.  Per mom is agreement after her Female cousin leaves.  LC explained to mom she probably would be able to get more milk.  LC also recommended to feed the baby now 7 ml of EBM and 30 ml of formula and by the time she gets the engorgement under control can try to latch.  Per mom is using a #21 F and its comfortable.   Maternal Data    Feeding Mother's Current Feeding Choice: Breast Milk and Formula Nipple Type: Slow - flow  LATCH Score Latch:  (mom is to engorged to latch the baby had this consult, LC exlained why its so important to ice for 20 mins and then pump both breast together - LC praised mom fro pumping 7 ml. Mom is in agreement to ice botn Br afer company that is coming to leaves.)     Lactation Tools Discussed/Used Tools: Pump;Flanges Flange Size: 21 Breast pump type: Double-Electric Breast Pump Pumping frequency: LC reviewed supply and demand, importance of being consistent with pumping now that her milk is in. Pumped volume: 7 mL  Interventions Interventions: Breast feeding basics reviewed;DEBP;Education  Discharge Discharge Education: Engorgement and breast care Pump: Manual  Consult Status Consult Status: PRN Date: 10/21/22 Follow-up type: In-patient    Megan Fuller 10/20/2022, 3:23 PM

## 2022-10-20 NOTE — Progress Notes (Signed)
POSTPARTUM PROGRESS NOTE  POD #3  Subjective:  Megan Fuller is a 37 y.o. [id number] s/p repeat LTCS, BTL at [redacted]w[redacted]d. Today she notes that she is doing well. She denies any problems with ambulating, voiding or po intake. Denies nausea or vomiting. She has passed flatus, no BM.  Pain is well controlled.  Lochia appropriate- pt states bleeding has been minimal Denies fever/chills/chest pain/SOB.  no HA,  no dizziness  Objective: Blood pressure 114/67, pulse 82, temperature 97.9 F (36.6 C), temperature source Oral, resp. rate 16, height 5\' 5"  (1.651 m), weight 93 kg, SpO2 100%, unknown if currently breastfeeding.  Physical Exam:  General: alert, cooperative and no distress Chest: no respiratory distress Heart: regular rate and rhythm Abdomen: soft, nontender, +BS Uterine Fundus: firm, appropriately tender Incision: C/D/I DVT Evaluation: No calf swelling or tenderness Extremities: no edema Skin: warm, dry  Results for orders placed or performed during the hospital encounter of 10/17/22 (from the past 24 hour(s))  CBC     Status: Abnormal   Collection Time: 10/20/22  4:01 AM  Result Value Ref Range   WBC 4.5 4.0 - 10.5 K/uL   RBC 1.95 (L) 3.87 - 5.11 MIL/uL   Hemoglobin 6.7 (LL) 12.0 - 15.0 g/dL   HCT 13.2 (L) 44.0 - 10.2 %   MCV 101.5 (H) 80.0 - 100.0 fL   MCH 34.4 (H) 26.0 - 34.0 pg   MCHC 33.8 30.0 - 36.0 g/dL   RDW 72.5 (H) 36.6 - 44.0 %   Platelets 45 (L) 150 - 400 K/uL   nRBC 0.0 0.0 - 0.2 %  Fibrinogen     Status: Abnormal   Collection Time: 10/20/22  4:01 AM  Result Value Ref Range   Fibrinogen 175 (L) 210 - 475 mg/dL    Assessment/Plan: Megan Fuller is a 37 y.o. [id number] s/p rLTCS and Bilateral salpingectomy [redacted]w[redacted]d POD#3   1) HEME- macrocytic anemia, chronic ITP -pt asymptomatic, lochia appropriate -will start with 1u pRBC and re-evaluate -plan for dexamethasone 40mg  daily x 4 days  2) Postop -pain well controlled -meeting milestones  appropriately  Contraception: salpingectomy Feeding: both  Dispo: Will continue to monitor today   LOS: 3 days   Myna Hidalgo, DO Faculty Attending, [facility] 10/20/2022, 7:25 AM

## 2022-10-20 NOTE — Progress Notes (Signed)
CSW received consult for Edinburgh Postnatal Depression Scale score of 12. CSW met with MOB to offer support and complete assessment. CSW was accompanied by in house Spanish interpreter, Byrd Hesselbach for Spanish translation. When CSW entered room, MOB was sitting in hospital bed. MOB had visitors present. CSW requested to speak with MOB alone, visitors left to provide privacy. MOB presented as calm and remained engaged during encounter.  CSW inquired how MOB is feeling emotionally since infant's arrival. MOB shared she is feeling "good." CSW reviewed MOB's Edinburgh responses with MOB and inquired how MOB has felt emotionally during the course of her pregnancy with infant. MOB shares that she has endorsed symptoms of depression anxiety, sharing that her feelings of sadness and worry have been triggered by a "combination of things." MOB shares that her pregnancy was unplanned and she had a high risk pregnancy due to health conditions. MOB shares that her first child was born at [redacted] weeks gestation which caused her pregnancy and postpartum period to be "very traumatic"", as she was unsure if he would survive after he was born. MOB recalls endorsing feelings of stress and worry during her pregnancy with infant. MOB denies symptoms of depression/anxiety prior to the birth of her first son. CSW inquired if MOB endorsed symptoms of postpartum depression/anxiety after her first child's birth. MOB reports that she did experience postpartum depression, marked by feelings of sadness that lasted "a long time." MOB recalls that she tried meeting with a psychologist but did not find it helpful. MOB shares that changing her mindset by using her son as a motivator helped her cope with feelings of depression.   MOB declined additional outpatient mental health resources at this time. CSW encouraged MOB to notify her OBGYN if she begins endorsing symptoms of postpartum depression/anxiety and reviewed treatment options. MOB identified her  sisters as supports. MOB denied current SI/HI/DV.   MOB reports she has a car seat for infant but is still in need of a crib and more diapers. MOB reports she is connected with [employer] and has an upcoming appointment 2023/01/16. MOB reports she will need a pack n play prior to discharge. CSW to bring pack n play. MOB reports she is also in need of formula for infant. CSW provided MOB with WIC information and placed [facility] referral. MOB reports she receives food stamps. CSW encouraged MOB to notify her caseworker of infant's birth to have benefit amount increased. MOB inquired about emergency Medicaid. CSW sent secure email to [facility] Financial Navigator requesting Medicaid application assistance for MOB and infant. MOB reports she has chosen [facility] as infant's pediatrician. MOB reports transportation barriers. CSW provided MOB with Medicaid transportation information.   CSW provided education regarding the baby blues period vs. perinatal mood disorders, discussed treatment and gave resources for mental health follow up if concerns arise.  CSW recommends self-evaluation during the postpartum time period using the New Mom Checklist from Postpartum Progress and encouraged MOB to contact a medical professional if symptoms are noted at any time.    CSW provided review of Sudden Infant Death Syndrome (SIDS) precautions.    CSW to deliver pack n play prior to infant's discharge.   Signed,  Norberto Sorenson, MSW, [facility] 11-24-2022 6:49 PM

## 2022-10-20 NOTE — Progress Notes (Signed)
Pt has a critical hgb of 6.7. MD notified no new orders at this time.

## 2022-10-21 ENCOUNTER — Other Ambulatory Visit (HOSPITAL_COMMUNITY): Payer: Self-pay

## 2022-10-21 LAB — BPAM RBC
Blood Product Expiration Date: 202409172359
Blood Product Expiration Date: 202409172359
ISSUE DATE / TIME: 202408250910
ISSUE DATE / TIME: 202408260246
Unit Type and Rh: 5100
Unit Type and Rh: 5100

## 2022-10-21 LAB — TYPE AND SCREEN
ABO/RH(D): O POS
Antibody Screen: NEGATIVE
Unit division: 0
Unit division: 0

## 2022-10-21 LAB — PREPARE FRESH FROZEN PLASMA

## 2022-10-21 LAB — BPAM FFP
Blood Product Expiration Date: 202408242359
Blood Product Expiration Date: 202408252359
Blood Product Expiration Date: 202408272359
Blood Product Expiration Date: 202408272359
ISSUE DATE / TIME: 202408221839
ISSUE DATE / TIME: 202408250617
ISSUE DATE / TIME: 202408261554
ISSUE DATE / TIME: 202408261554
Unit Type and Rh: 600
Unit Type and Rh: 6200
Unit Type and Rh: 6200
Unit Type and Rh: 7300

## 2022-10-21 LAB — CBC
HCT: 26.4 % — ABNORMAL LOW (ref 36.0–46.0)
Hemoglobin: 9.1 g/dL — ABNORMAL LOW (ref 12.0–15.0)
MCH: 32.9 pg (ref 26.0–34.0)
MCHC: 34.5 g/dL (ref 30.0–36.0)
MCV: 95.3 fL (ref 80.0–100.0)
Platelets: 60 10*3/uL — ABNORMAL LOW (ref 150–400)
RBC: 2.77 MIL/uL — ABNORMAL LOW (ref 3.87–5.11)
RDW: 16.5 % — ABNORMAL HIGH (ref 11.5–15.5)
WBC: 8.2 10*3/uL (ref 4.0–10.5)
nRBC: 0 % (ref 0.0–0.2)

## 2022-10-21 LAB — SURGICAL PATHOLOGY

## 2022-10-21 LAB — FIBRINOGEN: Fibrinogen: 224 mg/dL (ref 210–475)

## 2022-10-21 MED ORDER — FUROSEMIDE 20 MG PO TABS
20.0000 mg | ORAL_TABLET | Freq: Every day | ORAL | Status: DC
  Administered 2022-10-21: 20 mg via ORAL
  Filled 2022-10-21: qty 1

## 2022-10-21 MED ORDER — FUROSEMIDE 20 MG PO TABS
20.0000 mg | ORAL_TABLET | Freq: Every day | ORAL | 0 refills | Status: DC
  Filled 2022-10-21: qty 4, 4d supply, fill #0

## 2022-10-21 MED ORDER — ACETAMINOPHEN 325 MG PO TABS
650.0000 mg | ORAL_TABLET | Freq: Four times a day (QID) | ORAL | Status: AC | PRN
Start: 2022-10-21 — End: ?

## 2022-10-21 MED ORDER — DOCUSATE SODIUM 100 MG PO CAPS
100.0000 mg | ORAL_CAPSULE | Freq: Two times a day (BID) | ORAL | 0 refills | Status: AC
  Filled 2022-10-21: qty 100, 50d supply, fill #0

## 2022-10-21 MED ORDER — OXYCODONE HCL 5 MG PO TABS
5.0000 mg | ORAL_TABLET | Freq: Four times a day (QID) | ORAL | 0 refills | Status: AC | PRN
Start: 2022-10-21 — End: 2022-10-28
  Filled 2022-10-21: qty 26, 7d supply, fill #0

## 2022-10-21 MED ORDER — DEXAMETHASONE 4 MG PO TABS
40.0000 mg | ORAL_TABLET | Freq: Every day | ORAL | 0 refills | Status: AC
  Filled 2022-10-21: qty 30, 3d supply, fill #0

## 2022-10-21 NOTE — Discharge Summary (Signed)
Postpartum Discharge Summary  Date of Service updated-8/26     Patient Name: Megan Fuller DOB: 23-May-1985 MRN: [id number]  Date of admission: 10/17/2022 Delivery date:10/17/2022 Delivering provider: Federico Flake Date of discharge: 10/21/2022  Admitting diagnosis: S/P repeat low transverse C-section [Z98.891] Intrauterine pregnancy: [redacted]w[redacted]d     Secondary diagnosis:  Principal Problem:   S/P repeat low transverse C-section  Additional problems: Placenta abruption, Chronic ITP, Liver cirrhosis     Discharge diagnosis: Term Pregnancy Delivered and chronic ITP, liver cirrhosis, prior C-section, desires sterilization                                               Post partum procedures:blood transfusion Augmentation: N/A Complications: None  Hospital course: Sceduled C/S   37 y.o. yo G2P1102 at [redacted]w[redacted]d was admitted to the hospital 10/17/2022 for scheduled cesarean section with the following indication:Elective Repeat.Delivery details are as follows:  Membrane Rupture Time/Date:  ,   Delivery Method:C-Section, Low Transverse Operative Delivery:N/A Details of operation can be found in separate operative note.  Patient had a postpartum course complicated by acute blood loss anemia and ITP.  Prior to surgery, she was transfused 1u plt.  Postop, she required transfusion of 1u pRBC and was given oral dexamethasone.  She is ambulating, tolerating a regular diet, passing flatus, and urinating well. Due to borderline elevated BP, pt given Lasix x 5 days.  Patient is discharged home in stable condition on  10/21/22        Newborn Data: Birth date:10/17/2022 Birth time:12:34 PM Gender:Female Living status:Living Apgars:8 ,8  Weight:2980 g    Magnesium Sulfate received: No BMZ received: No Rhophylac:No MMR:No T-DaP: offered postpartum Flu: No Transfusion:No  Physical exam  Vitals:   10/20/22 1941 10/20/22 2308 10/21/22 0519 10/21/22 0742  BP: 135/85 (!) 147/90 132/86 126/78   Pulse: 97 98 79 88  Resp: 17 17 16 17   Temp: 98.3 F (36.8 C) 98.7 F (37.1 C) 98.5 F (36.9 C) 98.2 F (36.8 C)  TempSrc: Oral Oral Oral Oral  SpO2: 100% 99% 99% 99%  Weight:      Height:       General: alert, cooperative, and no distress Lochia: appropriate Uterine Fundus: firm Incision: Dressing is clean, dry, and intact DVT Evaluation: No evidence of DVT seen on physical exam. Labs: Lab Results  Component Value Date   WBC 8.2 10/21/2022   HGB 9.1 (L) 10/21/2022   HCT 26.4 (L) 10/21/2022   MCV 95.3 10/21/2022   PLT 60 (L) 10/21/2022      Latest Ref Rng & Units 10/17/2022   10:46 AM  CMP  Glucose 70 - 99 mg/dL 76   BUN 6 - 20 mg/dL 5   Creatinine 7.25 - 3.66 mg/dL 4.40   Sodium 347 - 425 mmol/L 137   Potassium 3.5 - 5.1 mmol/L 3.8   Chloride 98 - 111 mmol/L 108   CO2 22 - 32 mmol/L 20   Calcium 8.9 - 10.3 mg/dL 8.4   Total Protein 6.5 - 8.1 g/dL 5.9   Total Bilirubin 0.3 - 1.2 mg/dL 3.6   Alkaline Phos 38 - 126 U/L 165   AST 15 - 41 U/L 37   ALT 0 - 44 U/L 13    Edinburgh Score:    10/19/2022   11:00 AM  Edinburgh Postnatal Depression Scale  Screening Tool  I have been able to laugh and see the funny side of things. 2  I have looked forward with enjoyment to things. 1  I have blamed myself unnecessarily when things went wrong. 0  I have been anxious or worried for no good reason. 0  I have felt scared or panicky for no good reason. 1  Things have been getting on top of me. 1  I have been so unhappy that I have had difficulty sleeping. 2  I have felt sad or miserable. 3  I have been so unhappy that I have been crying. 2  The thought of harming myself has occurred to me. 0  Edinburgh Postnatal Depression Scale Total 12     After visit meds:  Allergies as of 10/21/2022       Reactions   Lactose Intolerance (gi)    bloating        Medication List     TAKE these medications    acetaminophen 325 MG tablet Commonly known as: Tylenol Take 2  tablets (650 mg total) by mouth every 6 (six) hours as needed.   dexAMETHasone 20 MG Tabs Take 40 mg by mouth daily for 3 days.   docusate sodium 100 MG capsule Commonly known as: Colace Take 1 capsule (100 mg total) by mouth 2 (two) times daily for 20 days.   famotidine 20 MG tablet Commonly known as: PEPCID Take 1 tablet (20 mg total) by mouth 2 (two) times daily.   furosemide 20 MG tablet Commonly known as: LASIX Take 1 tablet (20 mg total) by mouth daily for 4 days.   oxyCODONE 5 MG immediate release tablet Commonly known as: Oxy IR/ROXICODONE Take 1 tablet (5 mg total) by mouth every 6 (six) hours as needed for up to 7 days for severe pain or breakthrough pain (pain scale 4-7).   prenatal multivitamin Tabs tablet Take 1 tablet by mouth daily.         Discharge home in stable condition Infant Feeding: Bottle and Breast Infant Disposition:home with mother Discharge instruction: per After Visit Summary and Postpartum booklet. Activity: Advance as tolerated. Pelvic rest for 6 weeks.  Diet: routine diet Future Appointments: Future Appointments  Date Time Provider Department Center  10/29/2022 10:00 AM [facility] PAT 1 [facility] None   Follow up Visit:  Follow-up Information     Center for [facility]. Go in 1 week(s).   Specialty: Obstetrics and Gynecology Why: Follow up in one week for an incision check Contact information: [address] [place] [address] [phone]                 Please schedule this patient for a Virtual postpartum visit in 1 week with the following provider: MD. Additional Postpartum F/U:Incision check 1 week and BP check 1 week  High risk pregnancy complicated by:  ITP, liver cirrhosis Delivery mode:  C-Section, Low Transverse Anticipated Birth Control:   bilateral salpingectomy   10/21/2022 Sharon Seller, DO

## 2022-10-21 NOTE — Progress Notes (Signed)
Discharge instructions and prescriptions given to pt using Spanish interpreter. Discussed post c-section care, signs and symptoms to report to the MD, upcoming appointments, and meds. Pt verbalizes understanding and has no questions or concerns at this time. Pt discharged home from hospital with baby in stable condition.

## 2022-10-21 NOTE — Lactation Note (Addendum)
This note was copied from a baby's chart. Lactation Consultation Note  Patient Name: [name] [id number] Date: 10/21/2022 Age:35 days   LC in to visit with P2 Mom of ET infant.  Baby is down to 9.5% weight loss which is a drop of 54 gm from yesterday.  Baby is breast and bottle feeding.  Mom started pumping with the DEBP after becoming engorged.    Baby last fed 30 mins ago and drank 30 ml of EBM by bottle.  Mom currently in the shower.  This LC asked FOB to call the RN before baby is breastfeeding for an assist/assessment at the breast.  RN Marita Kansas also notified to call LC.  CSW referred patient for [facility].  LC faxed/scanned to email [facility] referral for pump at home.   [name] RN IBCLC 10/21/2022, 10:35 AM

## 2022-10-22 ENCOUNTER — Telehealth (HOSPITAL_COMMUNITY): Payer: Self-pay | Admitting: *Deleted

## 2022-10-22 NOTE — Telephone Encounter (Signed)
Hospital EPDS score was 12 (questions ten was 0).  Per hospital CSW note, patient declines Out-Patient mental health resources at this time.  Based on this, will not place IBH referral order now.  IBH order can be placed at any point in the future if patient changes her mind.  Salena Saner, RN 10/22/2022 09:41

## 2022-10-23 LAB — SURGICAL PATHOLOGY

## 2022-10-23 MED FILL — Heparin Sodium (Porcine) Inj 1000 Unit/ML: INTRAMUSCULAR | Qty: 30 | Status: AC

## 2022-10-23 MED FILL — Sodium Chloride IV Soln 0.9%: INTRAVENOUS | Qty: 1000 | Status: AC

## 2022-10-24 ENCOUNTER — Ambulatory Visit: Payer: Self-pay

## 2022-10-29 ENCOUNTER — Inpatient Hospital Stay (HOSPITAL_COMMUNITY)
Admission: AD | Admit: 2022-10-29 | Discharge: 2022-10-30 | Discharge status: Against Medical Advice | Payer: Self-pay | Attending: Obstetrics and Gynecology | Admitting: Obstetrics and Gynecology

## 2022-10-29 ENCOUNTER — Encounter (HOSPITAL_COMMUNITY)
Admission: RE | Admit: 2022-10-29 | Discharge: 2022-10-29 | Disposition: A | Discharge status: Home / Self Care | Payer: Self-pay | Source: Ambulatory Visit | Attending: Obstetrics and Gynecology | Admitting: Obstetrics and Gynecology

## 2022-10-29 DIAGNOSIS — K7460 Unspecified cirrhosis of liver: Secondary | ICD-10-CM | POA: Insufficient documentation

## 2022-10-29 DIAGNOSIS — M545 Low back pain, unspecified: Secondary | ICD-10-CM | POA: Insufficient documentation

## 2022-10-29 DIAGNOSIS — O9089 Other complications of the puerperium, not elsewhere classified: Secondary | ICD-10-CM | POA: Insufficient documentation

## 2022-10-29 DIAGNOSIS — R110 Nausea: Secondary | ICD-10-CM | POA: Insufficient documentation

## 2022-10-29 DIAGNOSIS — R109 Unspecified abdominal pain: Secondary | ICD-10-CM | POA: Insufficient documentation

## 2022-10-29 NOTE — MAU Note (Addendum)
.  Megan Fuller is a 37 y.o. at Unknown here in MAU reporting: PP delivery on 8/22 via C/S with tubal ligation. She reports nausea, dizziness, floaters, fatigue, forgetfulness, back pain, and abdominal pain. She reports she has been experiencing lower back and lower abdominal pain since being discharged. She reports she has not taken her medications as she feels they are "poison" and when she takes them she feels worse. She reports she feels as if the medications she received here have "poisoned her" because there is a bitter taste in her mouth and would like the MD who performed her surgery to be contacted to see what he thinks. Reports she is still experiencing vaginal bleeding but "very little."   Not getting much sleep. Last ate at 1100 this morning. Had oatmeal. Reports being nauseated and is also not eating as she has cirrhosis and does not want to cause any further issues.  Rx for Lasix, Oxycodone, and Dexamethasone.  Onset of complaint: Ongoing  Pain scores:  5/10 lower back 5/10 lower abdomen  Lab orders placed from triage:  none

## 2022-10-30 ENCOUNTER — Inpatient Hospital Stay (HOSPITAL_COMMUNITY): Admission: RE | Admit: 2022-10-30 | Payer: Self-pay | Source: Home / Self Care | Admitting: Obstetrics and Gynecology

## 2022-11-11 ENCOUNTER — Other Ambulatory Visit (HOSPITAL_COMMUNITY)
Admission: RE | Admit: 2022-11-11 | Discharge: 2022-11-11 | Disposition: A | Discharge status: Home / Self Care | Payer: Self-pay | Source: Ambulatory Visit | Attending: Family Medicine | Admitting: Family Medicine

## 2022-11-11 ENCOUNTER — Encounter: Payer: Self-pay | Admitting: Family Medicine

## 2022-11-11 ENCOUNTER — Other Ambulatory Visit: Payer: Self-pay

## 2022-11-11 ENCOUNTER — Ambulatory Visit (INDEPENDENT_AMBULATORY_CARE_PROVIDER_SITE_OTHER): Payer: Self-pay | Admitting: Family Medicine

## 2022-11-11 DIAGNOSIS — D696 Thrombocytopenia, unspecified: Secondary | ICD-10-CM

## 2022-11-11 DIAGNOSIS — D62 Acute posthemorrhagic anemia: Secondary | ICD-10-CM

## 2022-11-11 DIAGNOSIS — K7460 Unspecified cirrhosis of liver: Secondary | ICD-10-CM

## 2022-11-11 NOTE — Progress Notes (Signed)
Post Partum Visit Note  Megan Fuller is a 37 y.o. G34P1102 female who presents for a postpartum visit. She is 4 weeks postpartum following a repeat cesarean section.  I have fully reviewed the prenatal and intrapartum course. The delivery was at 37.1 gestational weeks.  Anesthesia: spinal. Postpartum course has been good. Baby is doing well. Baby is feeding by bottle - Similac Advance. Bleeding thin lochia. Bowel function is normal. Bladder function is normal. Patient is not sexually active. Contraception method is tubal ligation. Postpartum depression screening: negative.   The pregnancy intention screening data noted above was reviewed. Potential methods of contraception were discussed. The patient elected to proceed with No data recorded.   Edinburgh Postnatal Depression Scale - 11/11/22 1023       Edinburgh Postnatal Depression Scale:  In the Past 7 Days   I have been able to laugh and see the funny side of things. 0    I have looked forward with enjoyment to things. 0    I have blamed myself unnecessarily when things went wrong. 0    I have been anxious or worried for no good reason. 0    I have felt scared or panicky for no good reason. 0    Things have been getting on top of me. 0    I have been so unhappy that I have had difficulty sleeping. 0    I have felt sad or miserable. 0    I have been so unhappy that I have been crying. 0    The thought of harming myself has occurred to me. 0    Edinburgh Postnatal Depression Scale Total 0             Health Maintenance Due  Topic Date Due   COVID-19 Vaccine (1) Never done   INFLUENZA VACCINE  Never done   PAP SMEAR-Modifier  11/25/2022    The following portions of the patient's history were reviewed and updated as appropriate: allergies, current medications, past family history, past medical history, past social history, past surgical history, and problem list.  Review of Systems Pertinent items are noted in  HPI.  Objective:  BP 129/87   Pulse 74   Ht 5\' 4"  (1.626 m)   Wt 191 lb 3.2 oz (86.7 kg)   BMI 32.82 kg/m    General:  alert, cooperative, and appears stated age   Breasts:  not indicated  Lungs: clear to auscultation bilaterally  Heart:  regular rate and rhythm, S1, S2 normal, no murmur, click, rub or gallop  Abdomen: soft, non-tender; bowel sounds normal; no masses,  no organomegaly   Wound well approximated incision  GU exam:  not indicated       Assessment:   1. Cirrhosis of liver without ascites, unspecified hepatic cirrhosis type (HCC) - Comprehensive metabolic panel - Protime-INR - Hepatitis B surface antibody,quantitative  2. Thrombocytopenia (HCC) - CBC  3. Acute blood loss anemia - CBC  4. Postpartum exam - Cytology - PAP( [facility])   Normal postpartum exam.   Plan:   Essential components of care per ACOG recommendations:  1.  Mood and well being: Patient with negative depression screening today. Reviewed local resources for support.  - Patient tobacco use? No.   - hx of drug use? No.    2. Infant care and feeding:  -Patient currently breastmilk feeding? No.  -Social determinants of health (SDOH) reviewed in EPIC. The following needs were  identified limited healthcare access (no medical insurance)  3. Sexuality, contraception and birth spacing - Patient does not want a pregnancy in the next year.  Desired family size is 2 children.  - Reviewed reproductive life planning. Reviewed contraceptive methods based on pt preferences and effectiveness.  Patient desired Female Sterilization today.   - Discussed birth spacing of 18 months  4. Sleep and fatigue -Encouraged family/partner/community support of 4 hrs of uninterrupted sleep to help with mood and fatigue  5. Physical Recovery  - Discussed patients delivery and complications. She describes her labor as good. - Patient had a C-section emergent.  - Patient has urinary incontinence? No. - Patient  is safe to resume physical and sexual activity  6.  Health Maintenance - HM due items addressed Yes - Last pap smear  Diagnosis  Date Value Ref Range Status  11/25/2019   Final   - Negative for intraepithelial lesion or malignancy (NILM)   Pap smear done at today's visit.  -Breast Cancer screening indicated? No.   7. Chronic Disease/Pregnancy Condition follow up: Anemia  1. Cirrhosis of liver without ascites, unspecified hepatic cirrhosis type (HCC) Work up: Hep A Ab, ANA, anti-smooth muschle, anti microsomal liver/kidney Ab, Hep E Ab, CK, ceruloplasmin, alpha 1 antitrypsin, and tissue transglutaminase, Antimicrosomal Ab Liver-Kidney  Alpha 1 Antitryspin was elevated on 5/2 AntiSmooth muscle was elevated at 23 Needs to follow up with hepatology/GI-- she reports she has [employer] and has been told she needs to pay $500 before being seen at specialists. Encouraged her to call [facility] and provided number. She might have outstanding bills from prior to [employer]. [facility] is too far for patient to travel and does not want referral to them  2. Thrombocytopenia (HCC)  3. Acute blood loss anemia  4. Postpartum exam  - PCP follow up  No future appointments.   Federico Flake, MD Center for [employer], [facility]

## 2022-11-12 LAB — COMPREHENSIVE METABOLIC PANEL
ALT: 32 IU/L (ref 0–32)
AST: 65 IU/L — ABNORMAL HIGH (ref 0–40)
Albumin: 3.3 g/dL — ABNORMAL LOW (ref 3.9–4.9)
Alkaline Phosphatase: 208 IU/L — ABNORMAL HIGH (ref 44–121)
BUN/Creatinine Ratio: 13 (ref 9–23)
BUN: 10 mg/dL (ref 6–20)
Bilirubin Total: 2.3 mg/dL — ABNORMAL HIGH (ref 0.0–1.2)
CO2: 21 mmol/L (ref 20–29)
Calcium: 8.6 mg/dL — ABNORMAL LOW (ref 8.7–10.2)
Chloride: 106 mmol/L (ref 96–106)
Creatinine, Ser: 0.78 mg/dL (ref 0.57–1.00)
Globulin, Total: 2.7 g/dL (ref 1.5–4.5)
Glucose: 80 mg/dL (ref 70–99)
Potassium: 4 mmol/L (ref 3.5–5.2)
Sodium: 140 mmol/L (ref 134–144)
Total Protein: 6 g/dL (ref 6.0–8.5)
eGFR: 102 mL/min/{1.73_m2} (ref >59)

## 2022-11-12 LAB — CBC
Hematocrit: 31.7 % — ABNORMAL LOW (ref 34.0–46.6)
Hemoglobin: 10.8 g/dL — ABNORMAL LOW (ref 11.1–15.9)
MCH: 32.5 pg (ref 26.6–33.0)
MCHC: 34.1 g/dL (ref 31.5–35.7)
MCV: 96 fL (ref 79–97)
Platelets: 105 10*3/uL — ABNORMAL LOW (ref 150–450)
RBC: 3.32 x10E6/uL — ABNORMAL LOW (ref 3.77–5.28)
RDW: 13.2 % (ref 11.7–15.4)
WBC: 3.8 10*3/uL (ref 3.4–10.8)

## 2022-11-12 LAB — HEPATITIS B SURFACE ANTIBODY, QUANTITATIVE: Hepatitis B Surf Ab Quant: <3.5 m[IU]/mL — ABNORMAL LOW

## 2022-11-14 ENCOUNTER — Telehealth (HOSPITAL_COMMUNITY): Payer: Self-pay

## 2022-11-14 NOTE — Telephone Encounter (Signed)
11/14/2022 2008  Name: Megan Fuller MRN: [id number] DOB: [birth year]  Reason for Call:  Transition of Care Hospital Discharge Call  Contact Status: Patient Contact Status: Complete  Language assistant needed: Interpreter Mode: Telephonic Interpreter Interpreter Name: [phone] Interpreter Phone Number - If applicable: [phone]        Follow-Up Questions: Do You Have Any Concerns About Your Health As You Heal From Delivery?: Yes What Concerns Do You Have About Your Health?: Patient states that she saw her OB on 9/16 and addressed her questions and concerns. RN reviewed incision care and signs of infection to report to her OB-GYN. Patient declines any other questions or concerns about her health or healing. Do You Have Any Concerns About Your Infants Health?: Yes What Concerns Do You Have About Your Baby?: Patient states that baby's nose is congested more at night and he doesn't want to eat sometimes. RN told patient that she should reach out to her pediatrician about concerns. RN told patient that if baby is having any difficulty breathing or is congested she should come to the Pediatric Emergency Room. Patient verbalizes understanding. Patient has no other questions or concerns at this time.  Edinburgh Postnatal Depression Scale:  In the Past 7 Days:    PHQ2-9 Depression Scale:     Discharge Follow-up: Edinburgh score requires follow up?:  (Patient did EPDS at postpartum appointment on 11/11/22 and scored a 0. RN reviewed EPDS with patient and patient declines wanting to do EPDS at this time. She states that she is doing well emotionally.) Patient referred to:: Peds, Other (comment) (Pediatric ER)  Post-discharge interventions: Reviewed Newborn Safe Sleep Practices  Signature Signe Colt

## 2022-11-15 LAB — CYTOLOGY - PAP
Comment: NEGATIVE
Diagnosis: NEGATIVE
High risk HPV: NEGATIVE

## 2022-11-18 ENCOUNTER — Telehealth: Payer: Self-pay

## 2022-11-18 NOTE — Telephone Encounter (Addendum)
-----   Message from Federico Flake sent at 11/15/2022  9:09 PM EDT ----- NIL, next pap 5 years  Attempted to contact pt unable to leave message due to voicemail box is full.    Leonette Nutting  11/18/22

## 2022-11-27 ENCOUNTER — Ambulatory Visit: Payer: Self-pay | Admitting: Family Medicine

## 2023-01-09 ENCOUNTER — Encounter: Payer: Self-pay | Admitting: Internal Medicine

## 2023-11-22 ENCOUNTER — Encounter (HOSPITAL_COMMUNITY): Payer: Self-pay

## 2023-11-22 ENCOUNTER — Emergency Department (HOSPITAL_COMMUNITY): Payer: Self-pay

## 2023-11-22 ENCOUNTER — Other Ambulatory Visit: Payer: Self-pay

## 2023-11-22 ENCOUNTER — Inpatient Hospital Stay (HOSPITAL_COMMUNITY)
Admission: EM | Admit: 2023-11-22 | Discharge: 2023-11-24 | DRG: 872 | Disposition: A | Payer: Self-pay | Attending: Emergency Medicine | Admitting: Emergency Medicine

## 2023-11-22 DIAGNOSIS — Z6832 Body mass index (BMI) 32.0-32.9, adult: Secondary | ICD-10-CM

## 2023-11-22 DIAGNOSIS — N3000 Acute cystitis without hematuria: Secondary | ICD-10-CM

## 2023-11-22 DIAGNOSIS — D649 Anemia, unspecified: Secondary | ICD-10-CM | POA: Diagnosis present

## 2023-11-22 DIAGNOSIS — B9689 Other specified bacterial agents as the cause of diseases classified elsewhere: Secondary | ICD-10-CM | POA: Diagnosis present

## 2023-11-22 DIAGNOSIS — E66811 Obesity, class 1: Secondary | ICD-10-CM | POA: Diagnosis present

## 2023-11-22 DIAGNOSIS — A419 Sepsis, unspecified organism: Principal | ICD-10-CM | POA: Diagnosis present

## 2023-11-22 DIAGNOSIS — E739 Lactose intolerance, unspecified: Secondary | ICD-10-CM | POA: Diagnosis present

## 2023-11-22 DIAGNOSIS — N390 Urinary tract infection, site not specified: Secondary | ICD-10-CM | POA: Diagnosis present

## 2023-11-22 DIAGNOSIS — Z1152 Encounter for screening for COVID-19: Secondary | ICD-10-CM

## 2023-11-22 DIAGNOSIS — D696 Thrombocytopenia, unspecified: Secondary | ICD-10-CM | POA: Diagnosis present

## 2023-11-22 DIAGNOSIS — D693 Immune thrombocytopenic purpura: Secondary | ICD-10-CM | POA: Diagnosis present

## 2023-11-22 DIAGNOSIS — R509 Fever, unspecified: Principal | ICD-10-CM

## 2023-11-22 DIAGNOSIS — K219 Gastro-esophageal reflux disease without esophagitis: Secondary | ICD-10-CM | POA: Diagnosis present

## 2023-11-22 DIAGNOSIS — K766 Portal hypertension: Secondary | ICD-10-CM | POA: Diagnosis present

## 2023-11-22 DIAGNOSIS — K7460 Unspecified cirrhosis of liver: Secondary | ICD-10-CM | POA: Diagnosis present

## 2023-11-22 DIAGNOSIS — E871 Hypo-osmolality and hyponatremia: Secondary | ICD-10-CM | POA: Diagnosis present

## 2023-11-22 DIAGNOSIS — Z833 Family history of diabetes mellitus: Secondary | ICD-10-CM

## 2023-11-22 DIAGNOSIS — E861 Hypovolemia: Secondary | ICD-10-CM | POA: Diagnosis present

## 2023-11-22 LAB — URINALYSIS, W/ REFLEX TO CULTURE (INFECTION SUSPECTED)
Bilirubin Urine: NEGATIVE
Glucose, UA: NEGATIVE mg/dL
Hgb urine dipstick: NEGATIVE
Ketones, ur: NEGATIVE mg/dL
Nitrite: NEGATIVE
Protein, ur: NEGATIVE mg/dL
Specific Gravity, Urine: 1.004 — ABNORMAL LOW (ref 1.005–1.030)
pH: 7 (ref 5.0–8.0)

## 2023-11-22 LAB — COMPREHENSIVE METABOLIC PANEL WITH GFR
ALT: 58 U/L — ABNORMAL HIGH (ref 0–44)
AST: 78 U/L — ABNORMAL HIGH (ref 15–41)
Albumin: 3.2 g/dL — ABNORMAL LOW (ref 3.5–5.0)
Alkaline Phosphatase: 143 U/L — ABNORMAL HIGH (ref 38–126)
Anion gap: 8 (ref 5–15)
BUN: 5 mg/dL — ABNORMAL LOW (ref 6–20)
CO2: 21 mmol/L — ABNORMAL LOW (ref 22–32)
Calcium: 8.5 mg/dL — ABNORMAL LOW (ref 8.9–10.3)
Chloride: 100 mmol/L (ref 98–111)
Creatinine, Ser: 0.79 mg/dL (ref 0.44–1.00)
GFR, Estimated: >60 mL/min (ref >60)
Glucose, Bld: 155 mg/dL — ABNORMAL HIGH (ref 70–99)
Potassium: 3.9 mmol/L (ref 3.5–5.1)
Sodium: 129 mmol/L — ABNORMAL LOW (ref 135–145)
Total Bilirubin: 2.4 mg/dL — ABNORMAL HIGH (ref 0.0–1.2)
Total Protein: 6.3 g/dL — ABNORMAL LOW (ref 6.5–8.1)

## 2023-11-22 LAB — CBC WITH DIFFERENTIAL/PLATELET
Abs Immature Granulocytes: 0.02 K/uL (ref 0.00–0.07)
Basophils Absolute: 0 K/uL (ref 0.0–0.1)
Basophils Relative: 0 %
Eosinophils Absolute: 0 K/uL (ref 0.0–0.5)
Eosinophils Relative: 0 %
HCT: 30.4 % — ABNORMAL LOW (ref 36.0–46.0)
Hemoglobin: 10.3 g/dL — ABNORMAL LOW (ref 12.0–15.0)
Immature Granulocytes: 0 %
Lymphocytes Relative: 11 %
Lymphs Abs: 0.6 K/uL — ABNORMAL LOW (ref 0.7–4.0)
MCH: 30.6 pg (ref 26.0–34.0)
MCHC: 33.9 g/dL (ref 30.0–36.0)
MCV: 90.2 fL (ref 80.0–100.0)
Monocytes Absolute: 0.7 K/uL (ref 0.1–1.0)
Monocytes Relative: 12 %
Neutro Abs: 4.6 K/uL (ref 1.7–7.7)
Neutrophils Relative %: 77 %
Platelets: 62 K/uL — ABNORMAL LOW (ref 150–400)
RBC: 3.37 MIL/uL — ABNORMAL LOW (ref 3.87–5.11)
RDW: 13.3 % (ref 11.5–15.5)
WBC: 5.9 K/uL (ref 4.0–10.5)
nRBC: 0 % (ref 0.0–0.2)

## 2023-11-22 LAB — PROTIME-INR
INR: 1.3 — ABNORMAL HIGH (ref 0.8–1.2)
Prothrombin Time: 16.8 s — ABNORMAL HIGH (ref 11.4–15.2)

## 2023-11-22 LAB — I-STAT CG4 LACTIC ACID, ED
Lactic Acid, Venous: 2 mmol/L (ref 0.5–1.9)
Lactic Acid, Venous: 1.2 mmol/L (ref 0.5–1.9)

## 2023-11-22 LAB — HCG, SERUM, QUALITATIVE: Preg, Serum: NEGATIVE

## 2023-11-22 MED ORDER — LACTATED RINGERS IV SOLN: INTRAVENOUS | Status: DC

## 2023-11-22 MED ORDER — IOHEXOL 350 MG/ML SOLN
100.0000 mL | Freq: Once | INTRAVENOUS | Status: AC | PRN
Start: 2023-11-22 — End: 2023-11-22
  Administered 2023-11-22: 100 mL via INTRAVENOUS

## 2023-11-22 MED ORDER — ACETAMINOPHEN 325 MG PO TABS
325.0000 mg | ORAL_TABLET | Freq: Once | ORAL | Status: AC
  Administered 2023-11-22: 325 mg via ORAL
  Filled 2023-11-22: qty 1

## 2023-11-22 MED ORDER — SODIUM CHLORIDE 0.9 % IV SOLN
2.0000 g | Freq: Once | INTRAVENOUS | Status: AC
  Administered 2023-11-22: 2 g via INTRAVENOUS
  Filled 2023-11-22: qty 20

## 2023-11-22 MED ORDER — METRONIDAZOLE 500 MG/100ML IV SOLN
500.0000 mg | Freq: Once | INTRAVENOUS | Status: AC
  Administered 2023-11-22: 500 mg via INTRAVENOUS
  Filled 2023-11-22: qty 100

## 2023-11-22 NOTE — Assessment & Plan Note (Signed)
Chronic thrombocytopenia. 

## 2023-11-22 NOTE — H&P (Incomplete)
 Megan Fuller FMW:[id number] DOB: 1985-12-05 DOA: 11/22/2023     PCP: Pcp, No   Outpatient Specialists: * NONE CARDS: * Dr. None  NEphrology: *  Dr. No care team member to display  NEurology *   Dr. Pulmonary *  Dr.  Oncology * Dr.No care team member to display  GI* Dr.  Gwen, LB) No care team member to display Urology Dr. *  Patient arrived to ER on 11/22/23 at 1846 Referred by Attending Charlyn Sora, MD   Patient coming from:    home Lives alone,   *** With family    Chief Complaint:   Chief Complaint  Patient presents with   Emesis    HPI: Megan Fuller is a 38 y.o. female with medical history significant of Cirrhosis ITP    Presented with  fever 102.9 reported fever, headache,  Presents with lower back pain fever vomiting headaches lightheadedness She has known history of cirrhosis and ITP Back pain worse on left side no hematuria no dysuria though Left CVA tenderness In emergency department patient was given Rocephin  Sepsis protocol started CT abdomen did not show any stones but did show chronic cirrhosis and portal venous hypertension CTA no evidence of PE     Denies significant ETOH intake *** Does not smoke*** but interested in quitting***  Denies marijuana use ***    Regarding pertinent Chronic problems:    ****Hyperlipidemia - *on statins {statin:315258}  Lipid Panel  No results found for: CHOL, TRIG, HDL, CHOLHDL, VLDL, LDLCALC, LDLDIRECT, LABVLDL  ***HTN on   ***chronic CHF diastolic/systolic/ combined - last echo*** No results found for this or any previous visit (from the past 56199 hours).  *** CAD  - On Aspirin , statin, betablocker, Plavix                 - *followed by cardiology                - last cardiac cath         obesity-   BMI Readings from Last 1 Encounters:  11/22/23 32.27 kg/m     *** Asthma -well *** controlled on home inhalers/ nebs                      Liver disease MELD 3.0: 19  at 11/22/2023  7:31 PM MELD-Na: 20 at 11/22/2023  7:31 PM Calculated from: Serum Creatinine: 0.79 mg/dL (Using min of 1 mg/dL) at 0/72/7974  2:68 PM Serum Sodium: 129 mmol/L at 11/22/2023  7:31 PM Total Bilirubin: 2.4 mg/dL at 0/72/7974  2:68 PM Serum Albumin : 3.2 g/dL at 0/72/7974  2:68 PM INR(ratio): 1.3 at 11/22/2023  7:30 PM Age at listing (hypothetical): 37 years Sex: Female at 11/22/2023  7:31 PM  Hepatic Function Panel     Component Value Date/Time   PROT 6.3 (L) 11/22/2023 1931   PROT 6.0 11/11/2022 1133   PROT 7.3 08/04/2012 1408   ALBUMIN  3.2 (L) 11/22/2023 1931   ALBUMIN  3.3 (L) 11/11/2022 1133   ALBUMIN  3.4 (L) 08/04/2012 1408   AST 78 (H) 11/22/2023 1931   AST 34 06/08/2022 0953   AST 52 (H) 08/04/2012 1408   ALT 58 (H) 11/22/2023 1931   ALT 16 06/08/2022 0953   ALT 46 08/04/2012 1408   ALKPHOS 143 (H) 11/22/2023 1931   ALKPHOS 135 08/04/2012 1408   BILITOT 2.4 (H) 11/22/2023 1931   BILITOT 2.3 (H) 11/11/2022 1133   BILITOT 2.0 (H) 06/08/2022  9046   BILITOT 1.57 (H) 08/04/2012 1408   BILIDIR 0.9 (H) 07/29/2022 1532   IBILI 1.7 (H) 01/19/2022 1947   INR 1.3   Chronic anemia - baseline hg Hemoglobin & Hematocrit  Recent Labs    11/22/23 1930  HGB 10.3*   Iron/TIBC/Ferritin/ %Sat    Component Value Date/Time   IRON 222 (H) 06/08/2022 0953   TIBC 277 06/08/2022 0953   FERRITIN 72 06/08/2022 0954   IRONPCTSAT 80 (H) 06/08/2022 0953      While in ER:         Lab Orders         Culture, blood (Routine x 2)         Urine Culture         CBC with Differential         Protime-INR         Urinalysis, w/ Reflex to Culture (Infection Suspected) -Urine, Clean Catch         hCG, serum, qualitative         Comprehensive metabolic panel with GFR         I-Stat Lactic Acid, ED      CT HEAD *** NON acute   MRI brain  ***no acute CVA  CXR - ***NON acute  CTabd/pelvis - ***nonacute  CTA chest - ***nonacute, no PE, * no evidence of  infiltrate  Following Medications were ordered in ER: Medications  lactated ringers  infusion ( Intravenous New Bag/Given 11/22/23 2053)  metroNIDAZOLE  (FLAGYL ) IVPB 500 mg (500 mg Intravenous New Bag/Given 11/22/23 2242)  cefTRIAXone  (ROCEPHIN ) 2 g in sodium chloride  0.9 % 100 mL IVPB (0 g Intravenous Stopped 11/22/23 2142)  acetaminophen  (TYLENOL ) tablet 325 mg (325 mg Oral Given 11/22/23 2054)  iohexol (OMNIPAQUE) 350 MG/ML injection 100 mL (100 mLs Intravenous Contrast Given 11/22/23 2209)    _______________________________________________________ ER Provider Called:       DrGOMEZ  They Recommend admit to medicine *** Will see in AM  ***SEEN in ER     ED Triage Vitals [11/22/23 1854]  Encounter Vitals Group     BP 135/80     Girls Systolic BP Percentile      Girls Diastolic BP Percentile      Boys Systolic BP Percentile      Boys Diastolic BP Percentile      Pulse Rate (!) 110     Resp 19     Temp (!) 102.1 F (38.9 C)     Temp Source Oral     SpO2 98 %     Weight 188 lb (85.3 kg)     Height 5' 4 (1.626 m)     Head Circumference      Peak Flow      Pain Score 10     Pain Loc      Pain Education      Exclude from Growth Chart   UFJK(75)@     _________________________________________ Significant initial  Findings: Abnormal Labs Reviewed  CBC WITH DIFFERENTIAL/PLATELET - Abnormal; Notable for the following components:      Result Value   RBC 3.37 (*)    Hemoglobin 10.3 (*)    HCT 30.4 (*)    Platelets 62 (*)    Lymphs Abs 0.6 (*)    All other components within normal limits  PROTIME-INR - Abnormal; Notable for the following components:   Prothrombin Time 16.8 (*)    INR 1.3 (*)    All other components within normal limits  URINALYSIS, W/ REFLEX TO CULTURE (INFECTION SUSPECTED) - Abnormal; Notable for the following components:   Specific Gravity, Urine 1.004 (*)    Leukocytes,Ua LARGE (*)    Bacteria, UA FEW (*)    All other components within normal limits   COMPREHENSIVE METABOLIC PANEL WITH GFR - Abnormal; Notable for the following components:   Sodium 129 (*)    CO2 21 (*)    Glucose, Bld 155 (*)    BUN 5 (*)    Calcium  8.5 (*)    Total Protein 6.3 (*)    Albumin  3.2 (*)    AST 78 (*)    ALT 58 (*)    Alkaline Phosphatase 143 (*)    Total Bilirubin 2.4 (*)    All other components within normal limits  I-STAT CG4 LACTIC ACID, ED - Abnormal; Notable for the following components:   Lactic Acid, Venous 2.0 (*)    All other components within normal limits      _________________________ Troponin ***ordered Cardiac Panel (last 3 results) No results for input(s): CKTOTAL, CKMB, TROPONINIHS, RELINDX in the last 72 hours.   ECG: Ordered Personally reviewed and interpreted by me showing: HR : *** Rhythm: *NSR, Sinus tachycardia * A.fib. W RVR, RBBB, LBBB, Paced Ischemic changes*nonspecific changes, no evidence of ischemic changes QTC*  BNP (last 3 results) No results for input(s): BNP in the last 8760 hours.    No results for input(s): DDIMER, FERRITIN, LDH, CRP in the last 72 hours.    ____________________ This patient meets SIRS Criteria and may be septic. SIRS = Systemic Inflammatory Response Syndrome  Order a lactic acid level if needed AND/OR Initiate the sepsis protocol with the attached order set OR Click Treating Associated Infection or Illness if the patient is being treated for an infection that is a known cause of these abnormalities     The recent clinical data is shown below. Vitals:   11/22/23 2055 11/22/23 2100 11/22/23 2130 11/22/23 2232  BP: 110/69 111/75  113/78  Pulse: 93 94 93 89  Resp: 20 (!) 26 18 18   Temp:    99.5 F (37.5 C)  TempSrc:    Oral  SpO2: 97% 97% 97% 96%  Weight:      Height:            WBC     Component Value Date/Time   WBC 5.9 11/22/2023 1930   LYMPHSABS 0.6 (L) 11/22/2023 1930   LYMPHSABS 0.8 05/28/2022 1158   LYMPHSABS 0.8 (L) 08/04/2012 1408    MONOABS 0.7 11/22/2023 1930   MONOABS 0.2 08/04/2012 1408   EOSABS 0.0 11/22/2023 1930   EOSABS 0.1 05/28/2022 1158   BASOSABS 0.0 11/22/2023 1930   BASOSABS 0.0 05/28/2022 1158   BASOSABS 0.0 08/04/2012 1408        Lactic Acid, Venous    Component Value Date/Time   LATICACIDVEN 1.2 11/22/2023 2246     Lactic Acid, Venous    Component Value Date/Time   LATICACIDVEN 1.2 11/22/2023 2246    Procalcitonin *** Ordered      UA *** no evidence of UTI  ***Pending ***not ordered   Urine analysis:    Component Value Date/Time   COLORURINE YELLOW 11/22/2023 1930   APPEARANCEUR CLEAR 11/22/2023 1930   LABSPEC 1.004 (L) 11/22/2023 1930   PHURINE 7.0 11/22/2023 1930   GLUCOSEU NEGATIVE 11/22/2023 1930   HGBUR NEGATIVE 11/22/2023 1930   BILIRUBINUR NEGATIVE 11/22/2023 1930   BILIRUBINUR negative 10/24/2014 2021   KETONESUR NEGATIVE 11/22/2023  1930   PROTEINUR NEGATIVE 11/22/2023 1930   UROBILINOGEN 1.0 10/24/2014 2021   UROBILINOGEN 1.0 07/06/2014 1434   NITRITE NEGATIVE 11/22/2023 1930   LEUKOCYTESUR LARGE (A) 11/22/2023 1930    Results for orders placed or performed during the hospital encounter of 09/16/22  SARS Coronavirus 2 by RT PCR (hospital order, performed in [facility] hospital lab) *cepheid single result test* Anterior Nasal Swab     Status: None   Collection Time: 09/16/22  3:26 PM   Specimen: Anterior Nasal Swab  Result Value Ref Range Status   SARS Coronavirus 2 by RT PCR NEGATIVE NEGATIVE Final    Comment: Performed at [facility], [address], [place], [place] [address]  Culture, MAINE Urine     Status: Abnormal   Collection Time: 09/16/22  4:00 PM   Specimen: Urine, Clean Catch  Result Value Ref Range Status   Specimen Description URINE, CLEAN CATCH  Final   Special Requests NONE  Final   Culture (A)  Final    <10,000 COLONIES/mL INSIGNIFICANT GROWTH NO GROUP B STREP (S.AGALACTIAE) ISOLATED Performed at [facility], [address], [place], [place] [address]    Report Status 09/17/2022 FINAL  Final    ABX started Antibiotics Given (last 72 hours)     Date/Time Action Medication Dose Rate   11/22/23 2050 New Bag/Given   cefTRIAXone  (ROCEPHIN ) 2 g in sodium chloride  0.9 % 100 mL IVPB 2 g 200 mL/hr   11/22/23 2242 New Bag/Given   metroNIDAZOLE  (FLAGYL ) IVPB 500 mg 500 mg 100 mL/hr        No results found for the last 90 days.    ________________________________________________________________  Arterial ***Venous  Blood Gas result:  pH *** pCO2 ***; pO2 ***;     %O2 Sat ***.  ABG No results found for: PHART, PCO2ART, PO2ART, HCO3, TCO2, ACIDBASEDEF, O2SAT     __________________________________________________________ Recent Labs  Lab 11/22/23 1931  NA 129*  K 3.9  CO2 21*  GLUCOSE 155*  BUN 5*  CREATININE 0.79  CALCIUM  8.5*    Cr  * stable,  Up from baseline see below Lab Results  Component Value Date   CREATININE 0.79 11/22/2023   CREATININE 0.78 11/11/2022   CREATININE 0.71 10/17/2022    Recent Labs  Lab 11/22/23 1931  AST 78*  ALT 58*  ALKPHOS 143*  BILITOT 2.4*  PROT 6.3*  ALBUMIN  3.2*   Lab Results  Component Value Date   CALCIUM  8.5 (L) 11/22/2023          Plt: Lab Results  Component Value Date   PLT 62 (L) 11/22/2023         Recent Labs  Lab 11/22/23 1930  WBC 5.9  NEUTROABS 4.6  HGB 10.3*  HCT 30.4*  MCV 90.2  PLT 62*    HG/HCT * stable,  Down *Up from baseline see below    Component Value Date/Time   HGB 10.3 (L) 11/22/2023 1930   HGB 10.8 (L) 11/11/2022 1133   HGB 13.3 08/04/2012 1408   HCT 30.4 (L) 11/22/2023 1930   HCT 31.7 (L) 11/11/2022 1133   HCT 37.7 08/04/2012 1408   MCV 90.2 11/22/2023 1930   MCV 96 11/11/2022 1133   MCV 93.4 08/04/2012 1408      No results for input(s): LIPASE, AMYLASE in the last 168 hours. No results for input(s): AMMONIA in the last 168 hours.     _______________________________________________ Hospitalist was called for admission for ***  Fever, unspecified fever cause ***    The following Work up has been ordered so far:  Orders Placed This Encounter  Procedures   Culture, blood (Routine x 2)   Urine Culture   DG Chest 2 View if patient is not in a treatment room.   CT ABDOMEN PELVIS W CONTRAST   CT Angio Chest PE W and/or Wo Contrast   CT VENOGRAM HEAD   CBC with Differential   Protime-INR   Urinalysis, w/ Reflex to Culture (Infection Suspected) -Urine, Clean Catch   hCG, serum, qualitative   Comprehensive metabolic panel with GFR   Diet NPO time specified   Notify physician (specify)  Specify: Notify provider for possible Code Sepsis   Document height and weight   Assess and Document Glasgow Coma Scale   Document vital signs within 1-hour of fluid bolus completion.  Notify provider of abnormal vital signs despite fluid resuscitation.   Refer to Sidebar Report: Sepsis Bundle ED/IP   Notify provider for difficulties obtaining IV access   Initiate Carrier Fluid Protocol   Consult to hospitalist   I-Stat Lactic Acid, ED   ED EKG   Insert peripheral IV X 1     OTHER Significant initial  Findings:  labs showing:     DM  labs:  HbA1C: No results for input(s): HGBA1C in the last 8760 hours.     CBG (last 3)  No results for input(s): GLUCAP in the last 72 hours.        Cultures:    Component Value Date/Time   SDES URINE, CLEAN CATCH 09/16/2022 1600   SPECREQUEST NONE 09/16/2022 1600   CULT (A) 09/16/2022 1600    <10,000 COLONIES/mL INSIGNIFICANT GROWTH NO GROUP B STREP (S.AGALACTIAE) ISOLATED Performed at [facility], [address] [place], [place] [address]    REPTSTATUS 09/17/2022 FINAL 09/16/2022 1600     Radiological Exams on Admission: CT ABDOMEN PELVIS W CONTRAST Result Date: 11/22/2023 CLINICAL DATA:  Sepsis, cirrhosis. EXAM: CT ABDOMEN AND PELVIS WITH CONTRAST TECHNIQUE:  Multidetector CT imaging of the abdomen and pelvis was performed using the standard protocol following bolus administration of intravenous contrast. RADIATION DOSE REDUCTION: This exam was performed according to the departmental dose-optimization program which includes automated exposure control, adjustment of the mA and/or kV according to patient size and/or use of iterative reconstruction technique. CONTRAST:  OMNIPAQUE IOHEXOL 350 MG/ML SOLN COMPARISON:  01/19/2022 FINDINGS: Lower chest: No acute findings Hepatobiliary: Liver is nodular and shrunken compatible with cirrhosis. Benign calcification in the right hepatic lobe, stable. Gallbladder unremarkable. No biliary ductal dilatation. Pancreas: No focal abnormality or ductal dilatation. Spleen: Splenomegaly with craniocaudal length 19 cm. Adrenals/Urinary Tract: No adrenal abnormality. No focal renal abnormality. No stones or hydronephrosis. Urinary bladder is unremarkable. Stomach/Bowel: Normal appendix. Stomach, large and small bowel grossly unremarkable. Vascular/Lymphatic: No evidence of aneurysm or adenopathy. Spontaneous left splenorenal shunt due to portal venous hypertension. Reproductive: Uterus and adnexa unremarkable.  No mass. Other: No free fluid or free air. Musculoskeletal: No acute bony abnormality. IMPRESSION: Changes of cirrhosis with portal venous hypertension, spontaneous splenorenal shunt, and splenomegaly. No acute findings in the abdomen or pelvis. Electronically Signed   By: Franky Crease M.D.   On: 11/22/2023 22:20   CT Angio Chest PE W and/or Wo Contrast Result Date: 11/22/2023 CLINICAL DATA:  Pulmonary embolism (PE) suspected, high prob. Suspected sepsis. EXAM: CT ANGIOGRAPHY CHEST WITH CONTRAST TECHNIQUE: Multidetector CT imaging of the chest was performed using the standard protocol during  bolus administration of intravenous contrast. Multiplanar CT image reconstructions and MIPs were obtained to evaluate the vascular  anatomy. RADIATION DOSE REDUCTION: This exam was performed according to the departmental dose-optimization program which includes automated exposure control, adjustment of the mA and/or kV according to patient size and/or use of iterative reconstruction technique. CONTRAST:  100mL OMNIPAQUE IOHEXOL 350 MG/ML SOLN COMPARISON:  None Available. FINDINGS: Cardiovascular: Heart is normal size. Aorta is normal caliber. No filling defects in the pulmonary arteries to suggest pulmonary emboli. Mediastinum/Nodes: No mediastinal, hilar, or axillary adenopathy. Trachea and esophagus are unremarkable. Thyroid unremarkable. Lungs/Pleura: Dependent atelectasis in the lower lobes. No confluent opacities or effusions. Upper Abdomen: See abdominal CT report Musculoskeletal: Chest wall soft tissues are unremarkable. No acute bony abnormality. Review of the MIP images confirms the above findings. IMPRESSION: No evidence of pulmonary embolus. No acute cardiopulmonary disease. Electronically Signed   By: Franky Crease M.D.   On: 11/22/2023 22:17   DG Chest 2 View if patient is not in a treatment room. Result Date: 11/22/2023 CLINICAL DATA:  Suspected sepsis.  Left back pain with inspiration. EXAM: CHEST - 2 VIEW COMPARISON:  07/29/2022 FINDINGS: Heart and mediastinal contours are within normal limits. No focal opacities or effusions. No acute bony abnormality. No pneumothorax. IMPRESSION: No active cardiopulmonary disease. Electronically Signed   By: Franky Crease M.D.   On: 11/22/2023 19:15   _______________________________________________________________________________________________________ Latest  Blood pressure 113/78, pulse 89, temperature 99.5 F (37.5 C), temperature source Oral, resp. rate 18, height 5' 4 (1.626 m), weight 85.3 kg, SpO2 96%, unknown if currently breastfeeding.   Vitals  labs and radiology finding personally reviewed  Review of Systems:    Pertinent positives include: ***  Constitutional:  No  weight loss, night sweats, Fevers, chills, fatigue, weight loss  HEENT:  No headaches, Difficulty swallowing,Tooth/dental problems,Sore throat,  No sneezing, itching, ear ache, nasal congestion, post nasal drip,  Cardio-vascular:  No chest pain, Orthopnea, PND, anasarca, dizziness, palpitations.no Bilateral lower extremity swelling  GI:  No heartburn, indigestion, abdominal pain, nausea, vomiting, diarrhea, change in bowel habits, loss of appetite, melena, blood in stool, hematemesis Resp:  no shortness of breath at rest. No dyspnea on exertion, No excess mucus, no productive cough, No non-productive cough, No coughing up of blood.No change in color of mucus.No wheezing. Skin:  no rash or lesions. No jaundice GU:  no dysuria, change in color of urine, no urgency or frequency. No straining to urinate.  No flank pain.  Musculoskeletal:  No joint pain or no joint swelling. No decreased range of motion. No back pain.  Psych:  No change in mood or affect. No depression or anxiety. No memory loss.  Neuro: no localizing neurological complaints, no tingling, no weakness, no double vision, no gait abnormality, no slurred speech, no confusion  All systems reviewed and apart from HOPI all are negative _______________________________________________________________________________________________ Past Medical History:   Past Medical History:  Diagnosis Date   Cirrhosis of liver (HCC)    Noted on 12/2021 CT scan. LFTs reassuring. Negative Hepatitis evaluation at [facility] in 2018. Patient had liver issues as a child, biopsy recommended but she her family did not want to do this. No history of alcoholism.   Depression 2021   E.coli UTI (urinary tract infection) during pregnancy, second trimester 10/27/2019   Negative TOC   Gastroesophageal reflux disease 08/09/2022   PCOS (polycystic ovarian syndrome)    PCOS (polycystic ovarian syndrome)       Past Surgical History:  Procedure  Laterality Date    CESAREAN SECTION N/A 12/26/2019   Procedure: CESAREAN SECTION;  Surgeon: Izell Harari, MD;  Location: [facility];  Service: Obstetrics;  Laterality: N/A;   CESAREAN SECTION WITH BILATERAL TUBAL LIGATION N/A 10/17/2022   Procedure: CESAREAN SECTION WITH BILATERAL TUBAL LIGATION;  Surgeon: Eldonna Suzen Octave, MD;  Location: [facility] ORS;  Service: Obstetrics;  Laterality: N/A;   ESOPHAGOGASTRODUODENOSCOPY N/A 08/22/2022   Procedure: ESOPHAGOGASTRODUODENOSCOPY (EGD);  Surgeon: Avram Lupita BRAVO, MD;  Location: [facility];  Service: Gastroenterology;  Laterality: N/A;    Social History:  Ambulatory *** independently cane, walker  wheelchair bound, bed bound     reports that she has never smoked. She has never used smokeless tobacco. She reports that she does not drink alcohol and does not use drugs.     Family History: *** Family History  Problem Relation Age of Onset   Diabetes Father    Diabetes Maternal Grandmother    Stomach cancer Neg Hx    Colon cancer Neg Hx    Esophageal cancer Neg Hx    Asthma Neg Hx    Cancer Neg Hx    Heart disease Neg Hx    Hypertension Neg Hx    ______________________________________________________________________________________________ Allergies: Allergies  Allergen Reactions   Lactose Intolerance (Gi)     bloating     Prior to Admission medications   Medication Sig Start Date End Date Taking? Authorizing Provider  acetaminophen  (TYLENOL ) 325 MG tablet Take 2 tablets (650 mg total) by mouth every 6 (six) hours as needed. 10/21/22   Ozan, Jennifer, DO  famotidine  (PEPCID ) 20 MG tablet Take 1 tablet (20 mg total) by mouth 2 (two) times daily. Patient not taking: Reported on 11/11/2022 09/16/22   Antonetta Edsel CROME, CNM  Prenatal Vit-Fe Fumarate-FA (PRENATAL MULTIVITAMIN) TABS tablet Take 1 tablet by mouth daily.    [provider]     ___________________________________________________________________________________________________ Physical Exam:    11/22/2023   10:32 PM 11/22/2023    9:30 PM 11/22/2023    9:00 PM  Vitals with BMI  Systolic 113  111  Diastolic 78  75  Pulse 89 93 94     1. General:  in No ***Acute distress***increased work of breathing ***complaining of severe pain****agitated * Chronically ill *well *cachectic *toxic acutely ill -appearing 2. Psychological: Alert and *** Oriented 3. Head/ENT:   Moist *** Dry Mucous Membranes                          Head Non traumatic, neck supple                          Normal *** Poor Dentition 4. SKIN: normal *** decreased Skin turgor,  Skin clean Dry and intact no rash    5. Heart: Regular rate and rhythm no*** Murmur, no Rub or gallop 6. Lungs: ***Clear to auscultation bilaterally, no wheezes or crackles   7. Abdomen: Soft, ***non-tender, Non distended *** obese ***bowel sounds present 8. Lower extremities: no clubbing, cyanosis, no ***edema 9. Neurologically Grossly intact, moving all 4 extremities equally *** strength 5 out of 5 in all 4 extremities cranial nerves II through XII intact 10. MSK: Normal range of motion    Chart has been reviewed  ______________________________________________________________________________________________  Assessment/Plan  ***  Admitted for *** Fever, unspecified fever cause ***    Present on Admission: **None**     No problem-specific Assessment & Plan  notes found for this encounter.    Other plan as per orders.  DVT prophylaxis:  SCD *** Lovenox       Code Status:    Code Status: Prior FULL CODE *** DNR/DNI ***comfort care as per patient ***family  I had personally discussed CODE STATUS with patient and family*  ACP *** none has been reviewed ***   Family Communication:   Family not at  Bedside  plan of care was discussed on the phone with *** Son, Daughter, Wife, Husband, Sister,  Brother , father, mother  Diet  Diet Orders (From admission, onward)     Start     Ordered   11/22/23 1905  Diet NPO time specified  (Undifferentiated presentation (screening labs and basic nursing orders))  Diet effective now        11/22/23 1907            Disposition Plan:   *** likely will need placement for rehabilitation                          Back to current facility when stable                            To home once workup is complete and patient is stable  ***Following barriers for discharge:                             Chest pain *** Stroke *** Syncope ***work up is complete                            Electrolytes corrected                               Anemia corrected h/H stable                             Pain controlled with PO medications                               Afebrile, white count improving able to transition to PO antibiotics                             Will need to be able to tolerate PO                            Will likely need home health, home O2, set up                           Will need consultants to evaluate patient prior to discharge                           Work of breathing improves       Consult Orders  (From admission, onward)           Start     Ordered   11/22/23 2237  Consult to hospitalist  Once       Provider:  (Not yet assigned)  Question Answer Comment  Place call to: [facility] Hospitalist   Reason for Consult Admit      11/22/23 2237                              ***Would benefit from PT/OT eval prior to DC  Ordered                   Swallow eval - SLP ordered                   Diabetes care coordinator                   Transition of care consulted                   Nutrition    consulted                  Wound care  consulted                   Palliative care    consulted                   Behavioral health  consulted                    Consults called: ***  NONE   Admission status:  ED Disposition      None        Obs***  ***  inpatient     I Expect 2 midnight stay secondary to severity of patient's current illness need for inpatient interventions justified by the following: ***hemodynamic instability despite optimal treatment (tachycardia *hypotension * tachypnea *hypoxia, hypercapnia) *** Severe lab/radiological/exam abnormalities including:    Fever, unspecified fever cause ***  and extensive comorbidities including: *substance abuse  *Chronic pain *DM2  * CHF * CAD  * COPD/asthma *Morbid Obesity * CKD *dementia *liver disease *history of stroke with residual deficits *  malignancy, * sickle cell disease  History of amputation Chronic anticoagulation  That are currently affecting medical management.   I expect  patient to be hospitalized for 2 midnights requiring inpatient medical care.  Patient is at high risk for adverse outcome (such as loss of life or disability) if not treated.  Indication for inpatient stay as follows:  Severe change from baseline regarding mental status Hemodynamic instability despite maximal medical therapy,  severe pain requiring acute inpatient management,  inability to maintain oral hydration   persistent chest pain despite medical management Need for operative/procedural  intervention New or worsening hypoxia ongoing suicidal ideations   Need for IV antibiotics, IV fluids,, IV pain medications, IV anticoagulation,  IV rate controling medications, IV antihypertensives need for biPAP Frequent labs    Level of care   *** tele  For 12H 24H     medical floor       progressive     stepdown   tele indefinitely please discontinue once patient no longer qualifies COVID-19 Labs   Triniti Gruetzmacher 11/22/2023, 10:56 PM ***  [facility]     after 2 AM please page floor coverage   If 7AM-7PM, please contact the day team taking care of the patient using [email or web address]

## 2023-11-22 NOTE — Subjective & Objective (Signed)
 Presents with lower back pain fever vomiting headaches lightheadedness She has known history of cirrhosis and ITP Back pain worse on left side no hematuria no dysuria though Left CVA tenderness In emergency department patient was given Rocephin  Sepsis protocol started CT abdomen did not show any stones but did show chronic cirrhosis and portal venous hypertension CTA no evidence of PE

## 2023-11-22 NOTE — ED Triage Notes (Addendum)
 Patient has had lower back pain, fever coming and going, vomiting, headache, dizziness for 5 days. Patient has cirrhosis.

## 2023-11-22 NOTE — ED Provider Notes (Signed)
 [place] EMERGENCY DEPARTMENT AT [facility] Provider Note   CSN: [id number] Arrival date & time: 11/22/23  1846     Patient presents with: Emesis   Megan Fuller is a 38 y.o. female with pmhx of thrombocytopenia and cirrhosis who presents to the ED today for evaluation for fever, nausea and feeling like she might have a kidney stone. She is tachycardic and febrile on initial assessment and reports back pain, nausea, vomiting, shortness of breath and headache. Last episode emesis yesterday. Back pain is worse on the left side. No hematuria. No dysuria. Symptoms started a few days ago and have gotten worse.  Emesis Associated symptoms: fever        Prior to Admission medications   Medication Sig Start Date End Date Taking? Authorizing Provider  acetaminophen  (TYLENOL ) 325 MG tablet Take 2 tablets (650 mg total) by mouth every 6 (six) hours as needed. 10/21/22   Ozan, Jennifer, DO  famotidine  (PEPCID ) 20 MG tablet Take 1 tablet (20 mg total) by mouth 2 (two) times daily. Patient not taking: Reported on 11/11/2022 09/16/22   Antonetta Edsel CROME, CNM  Prenatal Vit-Fe Fumarate-FA (PRENATAL MULTIVITAMIN) TABS tablet Take 1 tablet by mouth daily.    [provider]    Allergies: Lactose intolerance (gi)    Review of Systems  Constitutional:  Positive for fever.  Gastrointestinal:  Positive for vomiting.    Updated Vital Signs BP 113/78   Pulse 89   Temp 99.5 F (37.5 C) (Oral)   Resp 18   Ht 5' 4 (1.626 m)   Wt 85.3 kg   SpO2 96%   BMI 32.27 kg/m   Physical Exam Vitals and nursing note reviewed.  Constitutional:      Appearance: Normal appearance.  HENT:     Head: Normocephalic and atraumatic.     Mouth/Throat:     Mouth: Mucous membranes are moist.  Eyes:     General: No scleral icterus.       Right eye: No discharge.        Left eye: No discharge.     Conjunctiva/sclera: Conjunctivae normal.  Cardiovascular:     Rate and Rhythm: Regular  rhythm. Tachycardia present.     Pulses: Normal pulses.  Pulmonary:     Effort: Pulmonary effort is normal.     Breath sounds: Normal breath sounds.  Abdominal:     General: There is no distension.     Tenderness: There is no abdominal tenderness. There is left CVA tenderness. There is no rebound. Negative signs include Rovsing's sign.     Comments: Abdomen non-tender. No ascites. Positive left CVA tenderness.  Musculoskeletal:        General: No deformity.     Cervical back: Normal range of motion.  Skin:    General: Skin is warm and dry.     Capillary Refill: Capillary refill takes less than 2 seconds.  Neurological:     Mental Status: She is alert.     Motor: No weakness.  Psychiatric:        Mood and Affect: Mood normal.     (all labs ordered are listed, but only abnormal results are displayed) Labs Reviewed  CBC WITH DIFFERENTIAL/PLATELET - Abnormal; Notable for the following components:      Result Value   RBC 3.37 (*)    Hemoglobin 10.3 (*)    HCT 30.4 (*)    Platelets 62 (*)    Lymphs Abs 0.6 (*)  All other components within normal limits  PROTIME-INR - Abnormal; Notable for the following components:   Prothrombin Time 16.8 (*)    INR 1.3 (*)    All other components within normal limits  URINALYSIS, W/ REFLEX TO CULTURE (INFECTION SUSPECTED) - Abnormal; Notable for the following components:   Specific Gravity, Urine 1.004 (*)    Leukocytes,Ua LARGE (*)    Bacteria, UA FEW (*)    All other components within normal limits  COMPREHENSIVE METABOLIC PANEL WITH GFR - Abnormal; Notable for the following components:   Sodium 129 (*)    CO2 21 (*)    Glucose, Bld 155 (*)    BUN 5 (*)    Calcium  8.5 (*)    Total Protein 6.3 (*)    Albumin  3.2 (*)    AST 78 (*)    ALT 58 (*)    Alkaline Phosphatase 143 (*)    Total Bilirubin 2.4 (*)    All other components within normal limits  I-STAT CG4 LACTIC ACID, ED - Abnormal; Notable for the following components:   Lactic  Acid, Venous 2.0 (*)    All other components within normal limits  CULTURE, BLOOD (ROUTINE X 2)  CULTURE, BLOOD (ROUTINE X 2)  URINE CULTURE  RESP PANEL BY RT-PCR (RSV, FLU A&B, COVID)  RVPGX2  HCG, SERUM, QUALITATIVE  I-STAT CG4 LACTIC ACID, ED    EKG: None  Radiology: CT ABDOMEN PELVIS W CONTRAST Result Date: 11/22/2023 CLINICAL DATA:  Sepsis, cirrhosis. EXAM: CT ABDOMEN AND PELVIS WITH CONTRAST TECHNIQUE: Multidetector CT imaging of the abdomen and pelvis was performed using the standard protocol following bolus administration of intravenous contrast. RADIATION DOSE REDUCTION: This exam was performed according to the departmental dose-optimization program which includes automated exposure control, adjustment of the mA and/or kV according to patient size and/or use of iterative reconstruction technique. CONTRAST:  OMNIPAQUE IOHEXOL 350 MG/ML SOLN COMPARISON:  01/19/2022 FINDINGS: Lower chest: No acute findings Hepatobiliary: Liver is nodular and shrunken compatible with cirrhosis. Benign calcification in the right hepatic lobe, stable. Gallbladder unremarkable. No biliary ductal dilatation. Pancreas: No focal abnormality or ductal dilatation. Spleen: Splenomegaly with craniocaudal length 19 cm. Adrenals/Urinary Tract: No adrenal abnormality. No focal renal abnormality. No stones or hydronephrosis. Urinary bladder is unremarkable. Stomach/Bowel: Normal appendix. Stomach, large and small bowel grossly unremarkable. Vascular/Lymphatic: No evidence of aneurysm or adenopathy. Spontaneous left splenorenal shunt due to portal venous hypertension. Reproductive: Uterus and adnexa unremarkable.  No mass. Other: No free fluid or free air. Musculoskeletal: No acute bony abnormality. IMPRESSION: Changes of cirrhosis with portal venous hypertension, spontaneous splenorenal shunt, and splenomegaly. No acute findings in the abdomen or pelvis. Electronically Signed   By: Franky Crease M.D.   On: 11/22/2023  22:20   CT Angio Chest PE W and/or Wo Contrast Result Date: 11/22/2023 CLINICAL DATA:  Pulmonary embolism (PE) suspected, high prob. Suspected sepsis. EXAM: CT ANGIOGRAPHY CHEST WITH CONTRAST TECHNIQUE: Multidetector CT imaging of the chest was performed using the standard protocol during bolus administration of intravenous contrast. Multiplanar CT image reconstructions and MIPs were obtained to evaluate the vascular anatomy. RADIATION DOSE REDUCTION: This exam was performed according to the departmental dose-optimization program which includes automated exposure control, adjustment of the mA and/or kV according to patient size and/or use of iterative reconstruction technique. CONTRAST:  100mL OMNIPAQUE IOHEXOL 350 MG/ML SOLN COMPARISON:  None Available. FINDINGS: Cardiovascular: Heart is normal size. Aorta is normal caliber. No filling defects in the pulmonary arteries to suggest pulmonary  emboli. Mediastinum/Nodes: No mediastinal, hilar, or axillary adenopathy. Trachea and esophagus are unremarkable. Thyroid unremarkable. Lungs/Pleura: Dependent atelectasis in the lower lobes. No confluent opacities or effusions. Upper Abdomen: See abdominal CT report Musculoskeletal: Chest wall soft tissues are unremarkable. No acute bony abnormality. Review of the MIP images confirms the above findings. IMPRESSION: No evidence of pulmonary embolus. No acute cardiopulmonary disease. Electronically Signed   By: Franky Crease M.D.   On: 11/22/2023 22:17   DG Chest 2 View if patient is not in a treatment room. Result Date: 11/22/2023 CLINICAL DATA:  Suspected sepsis.  Left back pain with inspiration. EXAM: CHEST - 2 VIEW COMPARISON:  07/29/2022 FINDINGS: Heart and mediastinal contours are within normal limits. No focal opacities or effusions. No acute bony abnormality. No pneumothorax. IMPRESSION: No active cardiopulmonary disease. Electronically Signed   By: Franky Crease M.D.   On: 11/22/2023 19:15    Procedures    Medications Ordered in the ED  lactated ringers  infusion ( Intravenous New Bag/Given 11/22/23 2053)  metroNIDAZOLE  (FLAGYL ) IVPB 500 mg (500 mg Intravenous New Bag/Given 11/22/23 2242)  cefTRIAXone  (ROCEPHIN ) 2 g in sodium chloride  0.9 % 100 mL IVPB (0 g Intravenous Stopped 11/22/23 2142)  acetaminophen  (TYLENOL ) tablet 325 mg (325 mg Oral Given 11/22/23 2054)  iohexol (OMNIPAQUE) 350 MG/ML injection 100 mL (100 mLs Intravenous Contrast Given 11/22/23 2209)                                Medical Decision Making Amount and/or Complexity of Data Reviewed Labs: ordered. Radiology: ordered.  Risk OTC drugs. Prescription drug management.   This patient presents to the ED for concern of sepsis this involves an extensive number of treatment options, and is a complaint that carries with it a high risk of complications and morbidity.  Differential diagnosis includes: portal vein thrombosis, splenic abscess, SBP, pyelonephritis, acute pancreatitis, upper UTI with abscess, infected urolithiasis, perinephritic abscess, pulmonary embolism.  Co morbidities: history of thrombocytopenia and cirrhosis   Additional history:  Patient seen by [facility] in June of 2024 for evaluation of cirrhosis of unclear etiology. Complete abdominal US  was completed in June 2024 which showed   1. Cirrhotic morphology of the liver. No focal hepatic lesion. 2. Bidirectional flow within the portal vein. 3. Splenomegaly. 4. Multiple dilated collateral vessels within the left upper quadrant within the splenic hila.  Lab Tests:  I Ordered, and personally interpreted labs.  The pertinent results include:  Lactic Acid, Venous    Component Value Date/Time   LATICACIDVEN 1.2 11/22/2023 2246   Urinalysis    Component Value Date/Time   COLORURINE YELLOW 11/22/2023 1930   APPEARANCEUR CLEAR 11/22/2023 1930   LABSPEC 1.004 (L) 11/22/2023 1930   PHURINE 7.0 11/22/2023 1930   GLUCOSEU NEGATIVE 11/22/2023 1930    HGBUR NEGATIVE 11/22/2023 1930   BILIRUBINUR NEGATIVE 11/22/2023 1930   BILIRUBINUR negative 10/24/2014 2021   KETONESUR NEGATIVE 11/22/2023 1930   PROTEINUR NEGATIVE 11/22/2023 1930   UROBILINOGEN 1.0 10/24/2014 2021   UROBILINOGEN 1.0 07/06/2014 1434   NITRITE NEGATIVE 11/22/2023 1930   LEUKOCYTESUR LARGE (A) 11/22/2023 1930     Imaging Studies:  I ordered imaging studies including chest xray,CT Angio Chest PE study I independently visualized and interpreted imaging which showed no evidence of acute cardiac abnormality, pulmonary embolism.   CT abdomen and pelvis shows: Changes of cirrhosis with portal venous hypertension, spontaneous  splenorenal shunt, and splenomegaly.  No acute findings in the abdomen or pelvis.   CT venogram pending at this time. I agree with the radiologist interpretation  Cardiac Monitoring/ECG:  The patient was maintained on a cardiac monitor.  I personally viewed and interpreted the cardiac monitored which showed an underlying rhythm of: NSR  Medicines ordered and prescription drug management:  I ordered medication including  Medications  lactated ringers  infusion ( Intravenous New Bag/Given 11/22/23 2053)  metroNIDAZOLE  (FLAGYL ) IVPB 500 mg (500 mg Intravenous New Bag/Given 11/22/23 2242)  cefTRIAXone  (ROCEPHIN ) 2 g in sodium chloride  0.9 % 100 mL IVPB (0 g Intravenous Stopped 11/22/23 2142)  acetaminophen  (TYLENOL ) tablet 325 mg (325 mg Oral Given 11/22/23 2054)  iohexol (OMNIPAQUE) 350 MG/ML injection 100 mL (100 mLs Intravenous Contrast Given 11/22/23 2209)   for fever and infection workup Reevaluation of the patient after these medicines showed that the patient improved I have reviewed the patients home medicines and have made adjustments as needed  Test Considered:  None  Critical Interventions:  None  Consultations Obtained: None at this time  Problem List / ED Course:     ICD-10-CM   1. Fever, unspecified fever cause  R50.9        MDM: Patient is a 38 year old female with pmhx significant for cirrhosis and thrombocytopenia who presents to the emergency department for nausea and vomiting. Patient is febrile and tachycardic on presentation. Positive left CVA tenderness on exam. Sepsis protocol started. Lab work shows Lactic of 2.0. UA with large amount of leukocyte esterase. CT abdomen pelvis pending. CT PE study negative. CT Venogram Head results pending. Patient started on IV rocephin . Will likely need admission to hospital due to her fever and lactic levels.  Spoke with Dr. Silvester on hospitalist team who requested we order a covid test. Will follow up on admission status once covid test is resulted.   Dispostion:  After consideration of the diagnostic results and the patients response to treatment, I feel that the patient would benefit from hospital admission, pending additional imaging results.   Final diagnoses:  Fever, unspecified fever cause    ED Discharge Orders     None          Torrence Marry GORMAN DEVONNA 11/22/23 2306    Charlyn Sora, MD 11/23/23 1309

## 2023-11-22 NOTE — Assessment & Plan Note (Signed)
 Chronic and stable.

## 2023-11-23 DIAGNOSIS — D649 Anemia, unspecified: Secondary | ICD-10-CM | POA: Diagnosis present

## 2023-11-23 DIAGNOSIS — A419 Sepsis, unspecified organism: Secondary | ICD-10-CM | POA: Diagnosis present

## 2023-11-23 DIAGNOSIS — E871 Hypo-osmolality and hyponatremia: Secondary | ICD-10-CM | POA: Diagnosis present

## 2023-11-23 DIAGNOSIS — N390 Urinary tract infection, site not specified: Secondary | ICD-10-CM

## 2023-11-23 LAB — CBC
HCT: 32.6 % — ABNORMAL LOW (ref 36.0–46.0)
Hemoglobin: 11 g/dL — ABNORMAL LOW (ref 12.0–15.0)
MCH: 30.8 pg (ref 26.0–34.0)
MCHC: 33.7 g/dL (ref 30.0–36.0)
MCV: 91.3 fL (ref 80.0–100.0)
Platelets: 58 K/uL — ABNORMAL LOW (ref 150–400)
RBC: 3.57 MIL/uL — ABNORMAL LOW (ref 3.87–5.11)
RDW: 13.6 % (ref 11.5–15.5)
WBC: 5.9 K/uL (ref 4.0–10.5)
nRBC: 0 % (ref 0.0–0.2)

## 2023-11-23 LAB — RETICULOCYTES
Immature Retic Fract: 14.6 % (ref 2.3–15.9)
RBC.: 3.57 MIL/uL — ABNORMAL LOW (ref 3.87–5.11)
Retic Count, Absolute: 71 K/uL (ref 19.0–186.0)
Retic Ct Pct: 2 % (ref 0.4–3.1)

## 2023-11-23 LAB — CREATININE, URINE, RANDOM: Creatinine, Urine: 49 mg/dL

## 2023-11-23 LAB — RESPIRATORY PANEL BY PCR

## 2023-11-23 LAB — COMPREHENSIVE METABOLIC PANEL WITH GFR
ALT: 48 U/L — ABNORMAL HIGH (ref 0–44)
AST: 59 U/L — ABNORMAL HIGH (ref 15–41)
Albumin: 2.9 g/dL — ABNORMAL LOW (ref 3.5–5.0)
Alkaline Phosphatase: 127 U/L — ABNORMAL HIGH (ref 38–126)
Anion gap: 8 (ref 5–15)
BUN: 5 mg/dL — ABNORMAL LOW (ref 6–20)
CO2: 22 mmol/L (ref 22–32)
Calcium: 8.3 mg/dL — ABNORMAL LOW (ref 8.9–10.3)
Chloride: 106 mmol/L (ref 98–111)
Creatinine, Ser: 0.73 mg/dL (ref 0.44–1.00)
GFR, Estimated: >60 mL/min (ref >60)
Glucose, Bld: 110 mg/dL — ABNORMAL HIGH (ref 70–99)
Potassium: 4 mmol/L (ref 3.5–5.1)
Sodium: 136 mmol/L (ref 135–145)
Total Bilirubin: 1.7 mg/dL — ABNORMAL HIGH (ref 0.0–1.2)
Total Protein: 5.8 g/dL — ABNORMAL LOW (ref 6.5–8.1)

## 2023-11-23 LAB — OSMOLALITY, URINE: Osmolality, Ur: 149 mosm/kg — ABNORMAL LOW (ref 300–900)

## 2023-11-23 LAB — SODIUM, URINE, RANDOM: Sodium, Ur: <30 mmol/L

## 2023-11-23 LAB — SEDIMENTATION RATE: Sed Rate: 34 mm/h — ABNORMAL HIGH (ref 0–22)

## 2023-11-23 LAB — MRSA NEXT GEN BY PCR, NASAL: MRSA by PCR Next Gen: NOT DETECTED

## 2023-11-23 LAB — OSMOLALITY: Osmolality: 289 mosm/kg (ref 275–295)

## 2023-11-23 LAB — FERRITIN: Ferritin: 44 ng/mL (ref 11–307)

## 2023-11-23 LAB — RESP PANEL BY RT-PCR (RSV, FLU A&B, COVID)  RVPGX2
Influenza A by PCR: NEGATIVE
Influenza B by PCR: NEGATIVE
Resp Syncytial Virus by PCR: NEGATIVE
SARS Coronavirus 2 by RT PCR: NEGATIVE

## 2023-11-23 LAB — HIV ANTIBODY (ROUTINE TESTING W REFLEX): HIV Screen 4th Generation wRfx: NONREACTIVE

## 2023-11-23 LAB — IRON AND TIBC
Iron: 42 ug/dL (ref 28–170)
Saturation Ratios: 16 % (ref 10.4–31.8)
TIBC: 253 ug/dL (ref 250–450)
UIBC: 212 ug/dL

## 2023-11-23 LAB — TSH: TSH: 2.28 u[IU]/mL (ref 0.350–4.500)

## 2023-11-23 LAB — PHOSPHORUS: Phosphorus: 3.7 mg/dL (ref 2.5–4.6)

## 2023-11-23 LAB — FOLATE: Folate: 10.5 ng/mL (ref >5.9)

## 2023-11-23 LAB — MAGNESIUM: Magnesium: 2.2 mg/dL (ref 1.7–2.4)

## 2023-11-23 LAB — CK: Total CK: 49 U/L (ref 38–234)

## 2023-11-23 LAB — VITAMIN B12: Vitamin B-12: 1236 pg/mL — ABNORMAL HIGH (ref 180–914)

## 2023-11-23 MED ORDER — TRAMADOL HCL 50 MG PO TABS: 50.0000 mg | ORAL_TABLET | Freq: Four times a day (QID) | ORAL | Status: DC | PRN

## 2023-11-23 MED ORDER — SODIUM CHLORIDE 0.9 % IV SOLN
2.0000 g | INTRAVENOUS | Status: DC
  Administered 2023-11-23: 2 g via INTRAVENOUS
  Filled 2023-11-23: qty 20

## 2023-11-23 MED ORDER — ONDANSETRON HCL 4 MG PO TABS: 4.0000 mg | ORAL_TABLET | Freq: Four times a day (QID) | ORAL | Status: DC | PRN

## 2023-11-23 MED ORDER — FENTANYL CITRATE PF 50 MCG/ML IJ SOSY
12.5000 ug | PREFILLED_SYRINGE | INTRAMUSCULAR | Status: DC | PRN
  Administered 2023-11-23 (×2): 50 ug via INTRAVENOUS
  Administered 2023-11-23: 12.5 ug via INTRAVENOUS
  Filled 2023-11-23 (×3): qty 1

## 2023-11-23 MED ORDER — SODIUM CHLORIDE 0.9 % IV SOLN: INTRAVENOUS | Status: DC

## 2023-11-23 MED ORDER — IBUPROFEN 200 MG PO TABS
400.0000 mg | ORAL_TABLET | Freq: Four times a day (QID) | ORAL | Status: DC | PRN
  Administered 2023-11-23: 400 mg via ORAL
  Filled 2023-11-23: qty 2

## 2023-11-23 MED ORDER — PROCHLORPERAZINE EDISYLATE 10 MG/2ML IJ SOLN: 10.0000 mg | Freq: Four times a day (QID) | INTRAMUSCULAR | Status: DC | PRN

## 2023-11-23 MED ORDER — SODIUM CHLORIDE 0.9 % IV SOLN: INTRAVENOUS | Status: AC

## 2023-11-23 MED ORDER — ONDANSETRON HCL 4 MG/2ML IJ SOLN
4.0000 mg | Freq: Four times a day (QID) | INTRAMUSCULAR | Status: DC | PRN
  Filled 2023-11-23: qty 2

## 2023-11-23 NOTE — Assessment & Plan Note (Signed)
 Patient has frequent presentations for back pain associated with UTIs.  Urine with some white blood cells and few bacteria.  Urine culture and blood cultures pending Started on Rocephin  in the emergency department will continue for now

## 2023-11-23 NOTE — ED Notes (Signed)
 Lab called to run new urine tests

## 2023-11-23 NOTE — Assessment & Plan Note (Signed)
 In the setting of cirrhosis Sodium has been fluctuating over the years Suspect currently at 129 somewhat low for the patient would admit for gently rehydration obtain urine   electrolytes

## 2023-11-23 NOTE — Assessment & Plan Note (Signed)
 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

## 2023-11-23 NOTE — Assessment & Plan Note (Signed)
 Obtain anemia panel  Transfuse for Hg <7 , rapidly dropping or  if symptomatic

## 2023-11-23 NOTE — Progress Notes (Signed)
 PROGRESS NOTE    Megan Fuller  FMW:[id number] DOB: 1986/01/15 DOA: 11/22/2023 PCP: Pcp, No    Brief Narrative:   Megan Fuller is a 38 y.o. female with past medical history significant for cirrhosis, ITP who presented to [facility] ED on 11/22/2023 with complaints of fever, lower back pain, nausea/vomiting, headache and dizziness over the last 5 days.  Back pain worst on the left.  Additionally endorses poor oral intake in days preceding hospitalization.  Denies sick contacts, no recent travel.  In the ED, temperature 102.1 F, HR 110, RR 19, BP 135/80, SpO2 98% on room air.  WBC 5.9, hemoglobin 10.3, platelet count 62.  Sodium 129, potassium 3.9, chloride 100, CO2 21, glucose 155, BUN 5, creatinine 0.79.  AST 78, ALT 58, total bilirubin 2.4.  hCG negative.  Urinalysis with large leukocytes, negative nitrite, few bacteria, 11-20 WBCs.  CT angiogram chest negative for pulmonary embolism, no acute cardiopulmonary disease process.  CT abdomen/pelvis with contrast with changes of cirrhosis with portal venous hypertension, spontaneous splenorenal shunt, splenomegaly, no acute findings in abdomen/pelvis.  CT venogram with no evidence for dural venous sinus thrombosis, no other acute intracranial normality.  Respiratory viral panel negative.  Blood cultures x 2, urine culture obtained.  Patient was given LR bolus, Tylenol , started on empiric antibiotics with ceftriaxone  and metronidazole .  TRH consulted for admission for further evaluation management of sepsis secondary to urinary tract infection.    Assessment & Plan:   Sepsis, POA Urinary tract infection Patient presenting with fever, nausea/vomiting, lower back pain more on the left with associated headache and dizziness over the last 5 days.  Patient was found to have a fever 102.1 F, was tachycardic.  Urinalysis consistent with urinary tract infection.  Physical exam findings with left-sided CVA tenderness concerning for pyelonephritis; although  CT abdomen/pelvis with no focal renal abnormality/stranding. -- Blood cultures x 2: Pending -- Urine culture: Pending -- Ceftriaxone  2 g IV every 24 hours -- Supportive care, antiemetics, antipyretics  Hyponatremia Patient presenting with a sodium level of 129, likely hypovolemic hyponatremia in the setting of poor oral intake in the days preceding hospitalization.  Supported with IV fluid hydration with improvement of sodium to 129. -- Repeat CMP in a.m.  Thrombocytopenia History of ITP Cirrhosis Patient with known history of cirrhosis and ITP of unclear etiology.  Patient reports developed cirrhosis after having several medications while she was pregnant with her child due to her child being premature.  CT abdomen/pelvis with findings of nodular and shrunken liver, portal venous hypertension, splenomegaly compatible with known cirrhosis. -- Plt 62>58, stable -- AST 78>59 -- ALT 58>48 -- Tbili 2.4>1.7 -- Not on chemical DVT prophylaxis due to thrombocytopenia -- CBC/CMP daily   DVT prophylaxis: SCDs Start: 11/23/23 0133    Code Status: Full Code Family Communication: No family present at bedside this morning  Disposition Plan:  Level of care: Progressive Status is: Observation The patient remains OBS appropriate and will d/c before 2 midnights.    Consultants:  None  Procedures:  None  Antimicrobials:  Ceftriaxone  9/27>> Metronidazole  9/27 - 9/27   Subjective: Patient seen and examined bedside, remains in ED holding area.  Continues with intermittent headache.  Fever has resolved.  Remains on IV antibiotics and IV fluids.  Sodium improved.  No other specific plaints or concerns at this time.  Denies visual changes, no chest pain, no palpitations, no shortness of breath, no abdominal pain, no current fever, no chills/night sweats, no current  nausea/vomiting/diarrhea, no cough/congestion, no focal weakness, no fatigue, no paresthesia.  No acute events overnight per  nursing staff.  Objective: Vitals:   11/23/23 0636 11/23/23 0700 11/23/23 0730 11/23/23 0800  BP:  (!) 108/93 118/86 108/73  Pulse:  80 85 89  Resp:  (!) 26 20 19   Temp: 98.7 F (37.1 C)     TempSrc: Oral     SpO2:  98% 98% 98%  Weight:      Height:       No intake or output data in the 24 hours ending 11/23/23 1018 Filed Weights   11/22/23 1854  Weight: 85.3 kg    Examination:  Physical Exam: GEN: NAD, alert and oriented x 3,  obese HEENT: NCAT, PERRL, EOMI, sclera clear, MMM PULM: CTAB w/o wheezes/crackles, normal respiratory effort, room air CV: RRR w/o M/G/R GI: abd soft, NTND,  + BS MSK: no peripheral edema, moves all extremities independently with preserved muscle strength NEURO: CN II-XII intact, no focal deficits, sensation to light touch intact PSYCH: normal mood/affect Integumentary: dry/intact, no rashes or wounds    Data Reviewed: I have personally reviewed following labs and imaging studies  CBC: Recent Labs  Lab 11/22/23 1930 11/23/23 0240  WBC 5.9 5.9  NEUTROABS 4.6  --   HGB 10.3* 11.0*  HCT 30.4* 32.6*  MCV 90.2 91.3  PLT 62* 58*   Basic Metabolic Panel: Recent Labs  Lab 11/22/23 1931 11/23/23 0240  NA 129* 136  K 3.9 4.0  CL 100 106  CO2 21* 22  GLUCOSE 155* 110*  BUN 5* 5*  CREATININE 0.79 0.73  CALCIUM  8.5* 8.3*  MG  --  2.2  PHOS  --  3.7   GFR: Estimated Creatinine Clearance: 103.7 mL/min (by C-G formula based on SCr of 0.73 mg/dL). Liver Function Tests: Recent Labs  Lab 11/22/23 1931 11/23/23 0240  AST 78* 59*  ALT 58* 48*  ALKPHOS 143* 127*  BILITOT 2.4* 1.7*  PROT 6.3* 5.8*  ALBUMIN  3.2* 2.9*   No results for input(s): LIPASE, AMYLASE in the last 168 hours. No results for input(s): AMMONIA in the last 168 hours. Coagulation Profile: Recent Labs  Lab 11/22/23 1930  INR 1.3*   Cardiac Enzymes: No results for input(s): CKTOTAL, CKMB, CKMBINDEX, TROPONINI in the last 168 hours. BNP (last 3  results) No results for input(s): PROBNP in the last 8760 hours. HbA1C: No results for input(s): HGBA1C in the last 72 hours. CBG: No results for input(s): GLUCAP in the last 168 hours. Lipid Profile: No results for input(s): CHOL, HDL, LDLCALC, TRIG, CHOLHDL, LDLDIRECT in the last 72 hours. Thyroid Function Tests: Recent Labs    11/23/23 0240  TSH 2.280   Anemia Panel: Recent Labs    11/23/23 0240  RETICCTPCT 2.0   Sepsis Labs: Recent Labs  Lab 11/22/23 1952 11/22/23 2246  LATICACIDVEN 2.0* 1.2    Recent Results (from the past 240 hours)  Resp panel by RT-PCR (RSV, Flu A&B, Covid) Anterior Nasal Swab     Status: None   Collection Time: 11/22/23 11:15 PM   Specimen: Anterior Nasal Swab  Result Value Ref Range Status   SARS Coronavirus 2 by RT PCR NEGATIVE NEGATIVE Final    Comment: (NOTE) SARS-CoV-2 target nucleic acids are NOT DETECTED.  The SARS-CoV-2 RNA is generally detectable in upper respiratory specimens during the acute phase of infection. The lowest concentration of SARS-CoV-2 viral copies this assay can detect is 138 copies/mL. A negative result does not preclude SARS-Cov-2  infection and should not be used as the sole basis for treatment or other patient management decisions. A negative result may occur with  improper specimen collection/handling, submission of specimen other than nasopharyngeal swab, presence of viral mutation(s) within the areas targeted by this assay, and inadequate number of viral copies(<138 copies/mL). A negative result must be combined with clinical observations, patient history, and epidemiological information. The expected result is Negative.  Fact Sheet for Patients:  BloggerCourse.com  Fact Sheet for Healthcare Providers:  SeriousBroker.it  This test is no t yet approved or cleared by the [place]  FDA and  has been authorized for detection and/or  diagnosis of SARS-CoV-2 by FDA under an Emergency Use Authorization (EUA). This EUA will remain  in effect (meaning this test can be used) for the duration of the COVID-19 declaration under Section 564(b)(1) of the Act, 21 U.S.C.section 360bbb-3(b)(1), unless the authorization is terminated  or revoked sooner.       Influenza A by PCR NEGATIVE NEGATIVE Final   Influenza B by PCR NEGATIVE NEGATIVE Final    Comment: (NOTE) The Xpert Xpress SARS-CoV-2/FLU/RSV plus assay is intended as an aid in the diagnosis of influenza from Nasopharyngeal swab specimens and should not be used as a sole basis for treatment. Nasal washings and aspirates are unacceptable for Xpert Xpress SARS-CoV-2/FLU/RSV testing.  Fact Sheet for Patients: BloggerCourse.com  Fact Sheet for Healthcare Providers: SeriousBroker.it  This test is not yet approved or cleared by the [place]  FDA and has been authorized for detection and/or diagnosis of SARS-CoV-2 by FDA under an Emergency Use Authorization (EUA). This EUA will remain in effect (meaning this test can be used) for the duration of the COVID-19 declaration under Section 564(b)(1) of the Act, 21 U.S.C. section 360bbb-3(b)(1), unless the authorization is terminated or revoked.     Resp Syncytial Virus by PCR NEGATIVE NEGATIVE Final    Comment: (NOTE) Fact Sheet for Patients: BloggerCourse.com  Fact Sheet for Healthcare Providers: SeriousBroker.it  This test is not yet approved or cleared by the [place]  FDA and has been authorized for detection and/or diagnosis of SARS-CoV-2 by FDA under an Emergency Use Authorization (EUA). This EUA will remain in effect (meaning this test can be used) for the duration of the COVID-19 declaration under Section 564(b)(1) of the Act, 21 U.S.C. section 360bbb-3(b)(1), unless the authorization is terminated  or revoked.  Performed at [facility], [address], [place], [place] [address]   Respiratory (~20 pathogens) panel by PCR     Status: None   Collection Time: 11/23/23  2:49 AM   Specimen: Nasopharyngeal Swab; Respiratory  Result Value Ref Range Status   Adenovirus NOT DETECTED NOT DETECTED Final   Coronavirus 229E NOT DETECTED NOT DETECTED Final    Comment: (NOTE) The Coronavirus on the Respiratory Panel, DOES NOT test for the novel  Coronavirus (2019 nCoV)    Coronavirus HKU1 NOT DETECTED NOT DETECTED Final   Coronavirus NL63 NOT DETECTED NOT DETECTED Final   Coronavirus OC43 NOT DETECTED NOT DETECTED Final   Metapneumovirus NOT DETECTED NOT DETECTED Final   Rhinovirus / Enterovirus NOT DETECTED NOT DETECTED Final   Influenza A NOT DETECTED NOT DETECTED Final   Influenza B NOT DETECTED NOT DETECTED Final   Parainfluenza Virus 1 NOT DETECTED NOT DETECTED Final   Parainfluenza Virus 2 NOT DETECTED NOT DETECTED Final   Parainfluenza Virus 3 NOT DETECTED NOT DETECTED Final   Parainfluenza Virus 4 NOT DETECTED NOT DETECTED Final   Respiratory  Syncytial Virus NOT DETECTED NOT DETECTED Final   Bordetella pertussis NOT DETECTED NOT DETECTED Final   Bordetella Parapertussis NOT DETECTED NOT DETECTED Final   Chlamydophila pneumoniae NOT DETECTED NOT DETECTED Final   Mycoplasma pneumoniae NOT DETECTED NOT DETECTED Final    Comment: Performed at [facility], [address], [place], [place] [address]         Radiology Studies: CT VENOGRAM HEAD Result Date: 11/22/2023 CLINICAL DATA:  Initial evaluation for acute headache. EXAM: CT VENOGRAM HEAD TECHNIQUE: Venographic phase images of the brain were obtained following the administration of intravenous contrast. Multiplanar reformats and maximum intensity projections were generated. RADIATION DOSE REDUCTION: This exam was performed according to the departmental dose-optimization program which includes automated  exposure control, adjustment of the mA and/or kV according to patient size and/or use of iterative reconstruction technique. CONTRAST:  100mL OMNIPAQUE IOHEXOL 350 MG/ML SOLN COMPARISON:  Prior CT from 09/12/2019. FINDINGS: Brain: Cerebral volume within normal limits for patient age. No acute intracranial hemorrhage. No acute large vessel territory infarct. No mass lesion, midline shift, or mass effect. Ventricles are normal in size without hydrocephalus. No extra-axial fluid collection. Vascular: No abnormal hyperdense vessel seen prior to contrast administration. Following contrast administration, normal enhancement seen throughout the superior sagittal sinus to the torcula. Transverse and sigmoid sinuses are patent as are the jugular bulbs and visualized proximal internal jugular veins. Left transverse sinus dominant. Straight sinus, vein of Galen, and internal cerebral veins are patent. No evidence for dural venous sinus thrombosis. No dural venous sinus stenosis. No appreciable cortical vein abnormality. No abnormality about the cavernous sinus. Superior orbital veins symmetric and within normal limits. Skull: Scalp soft tissues demonstrate no acute abnormality. Calvarium intact. Sinuses/Orbits: Globes and orbital soft tissues within normal limits. Visualized paranasal sinuses are largely clear. No significant mastoid effusion. IMPRESSION: 1. Normal CT venogram. No evidence for dural venous sinus thrombosis. 2. No other acute intracranial abnormality. Electronically Signed   By: Morene Hoard M.D.   On: 11/22/2023 23:33   CT ABDOMEN PELVIS W CONTRAST Result Date: 11/22/2023 CLINICAL DATA:  Sepsis, cirrhosis. EXAM: CT ABDOMEN AND PELVIS WITH CONTRAST TECHNIQUE: Multidetector CT imaging of the abdomen and pelvis was performed using the standard protocol following bolus administration of intravenous contrast. RADIATION DOSE REDUCTION: This exam was performed according to the departmental  dose-optimization program which includes automated exposure control, adjustment of the mA and/or kV according to patient size and/or use of iterative reconstruction technique. CONTRAST:  OMNIPAQUE IOHEXOL 350 MG/ML SOLN COMPARISON:  01/19/2022 FINDINGS: Lower chest: No acute findings Hepatobiliary: Liver is nodular and shrunken compatible with cirrhosis. Benign calcification in the right hepatic lobe, stable. Gallbladder unremarkable. No biliary ductal dilatation. Pancreas: No focal abnormality or ductal dilatation. Spleen: Splenomegaly with craniocaudal length 19 cm. Adrenals/Urinary Tract: No adrenal abnormality. No focal renal abnormality. No stones or hydronephrosis. Urinary bladder is unremarkable. Stomach/Bowel: Normal appendix. Stomach, large and small bowel grossly unremarkable. Vascular/Lymphatic: No evidence of aneurysm or adenopathy. Spontaneous left splenorenal shunt due to portal venous hypertension. Reproductive: Uterus and adnexa unremarkable.  No mass. Other: No free fluid or free air. Musculoskeletal: No acute bony abnormality. IMPRESSION: Changes of cirrhosis with portal venous hypertension, spontaneous splenorenal shunt, and splenomegaly. No acute findings in the abdomen or pelvis. Electronically Signed   By: Franky Crease M.D.   On: 11/22/2023 22:20   CT Angio Chest PE W and/or Wo Contrast Result Date: 11/22/2023 CLINICAL DATA:  Pulmonary embolism (PE) suspected, high prob. Suspected  sepsis. EXAM: CT ANGIOGRAPHY CHEST WITH CONTRAST TECHNIQUE: Multidetector CT imaging of the chest was performed using the standard protocol during bolus administration of intravenous contrast. Multiplanar CT image reconstructions and MIPs were obtained to evaluate the vascular anatomy. RADIATION DOSE REDUCTION: This exam was performed according to the departmental dose-optimization program which includes automated exposure control, adjustment of the mA and/or kV according to patient size and/or use of  iterative reconstruction technique. CONTRAST:  100mL OMNIPAQUE IOHEXOL 350 MG/ML SOLN COMPARISON:  None Available. FINDINGS: Cardiovascular: Heart is normal size. Aorta is normal caliber. No filling defects in the pulmonary arteries to suggest pulmonary emboli. Mediastinum/Nodes: No mediastinal, hilar, or axillary adenopathy. Trachea and esophagus are unremarkable. Thyroid unremarkable. Lungs/Pleura: Dependent atelectasis in the lower lobes. No confluent opacities or effusions. Upper Abdomen: See abdominal CT report Musculoskeletal: Chest wall soft tissues are unremarkable. No acute bony abnormality. Review of the MIP images confirms the above findings. IMPRESSION: No evidence of pulmonary embolus. No acute cardiopulmonary disease. Electronically Signed   By: Franky Crease M.D.   On: 11/22/2023 22:17   DG Chest 2 View if patient is not in a treatment room. Result Date: 11/22/2023 CLINICAL DATA:  Suspected sepsis.  Left back pain with inspiration. EXAM: CHEST - 2 VIEW COMPARISON:  07/29/2022 FINDINGS: Heart and mediastinal contours are within normal limits. No focal opacities or effusions. No acute bony abnormality. No pneumothorax. IMPRESSION: No active cardiopulmonary disease. Electronically Signed   By: Franky Crease M.D.   On: 11/22/2023 19:15        Scheduled Meds: Continuous Infusions:  sodium chloride  75 mL/hr at 11/23/23 9362   cefTRIAXone  (ROCEPHIN )  IV       LOS: 0 days    Time spent: 50 minutes spent on 11/23/2023 caring for this patient face-to-face including chart review, ordering labs/tests, documenting, discussion with nursing staff, consultants, updating family and interview/physical exam    Camellia PARAS [place], DO [facility] Available via Epic secure chat 7am-7pm After these hours, please refer to coverage provider listed on [email or web address] 11/23/2023, 10:18 AM

## 2023-11-23 NOTE — Assessment & Plan Note (Signed)
-  SIRS criteria met with      Component Value Date/Time   WBC 5.9 11/22/2023 1930   LYMPHSABS 0.6 (L) 11/22/2023 1930   LYMPHSABS 0.8 05/28/2022 1158   LYMPHSABS 0.8 (L) 08/04/2012 1408     tachycardia   ,   fever   RR >20 Today's Vitals   11/22/23 2130 11/22/23 2232 11/22/23 2300 11/23/23 0000  BP:  113/78 114/82 116/78  Pulse: 93 89 87 87  Resp: 18 18 18 18   Temp:  99.5 F (37.5 C)    TempSrc:  Oral    SpO2: 97% 96% 95% 96%  Weight:      Height:      PainSc:  8         -Most likely source being:  urinary       - Obtain serial lactic acid and procalcitonin level.  - Initiated IV antibiotics in ER: Antibiotics Given (last 72 hours)     Date/Time Action Medication Dose Rate   11/22/23 2050 New Bag/Given   cefTRIAXone  (ROCEPHIN ) 2 g in sodium chloride  0.9 % 100 mL IVPB 2 g 200 mL/hr   11/22/23 2242 New Bag/Given   metroNIDAZOLE  (FLAGYL ) IVPB 500 mg 500 mg 100 mL/hr       Will continue  on : rocephin    - await results of blood and urine culture  - Rehydrate aggressively  Intravenous fluids were administered         30cc/kg fluid  12:55 AM

## 2023-11-24 ENCOUNTER — Other Ambulatory Visit: Payer: Self-pay

## 2023-11-24 ENCOUNTER — Other Ambulatory Visit (HOSPITAL_COMMUNITY): Payer: Self-pay

## 2023-11-24 LAB — CBC
HCT: 32.7 % — ABNORMAL LOW (ref 36.0–46.0)
Hemoglobin: 10.8 g/dL — ABNORMAL LOW (ref 12.0–15.0)
MCH: 30.5 pg (ref 26.0–34.0)
MCHC: 33 g/dL (ref 30.0–36.0)
MCV: 92.4 fL (ref 80.0–100.0)
Platelets: 55 K/uL — ABNORMAL LOW (ref 150–400)
RBC: 3.54 MIL/uL — ABNORMAL LOW (ref 3.87–5.11)
RDW: 14 % (ref 11.5–15.5)
WBC: 2.8 K/uL — ABNORMAL LOW (ref 4.0–10.5)
nRBC: 0 % (ref 0.0–0.2)

## 2023-11-24 LAB — COMPREHENSIVE METABOLIC PANEL WITH GFR
ALT: 36 U/L (ref 0–44)
AST: 40 U/L (ref 15–41)
Albumin: 2.7 g/dL — ABNORMAL LOW (ref 3.5–5.0)
Alkaline Phosphatase: 106 U/L (ref 38–126)
Anion gap: 6 (ref 5–15)
BUN: 6 mg/dL (ref 6–20)
CO2: 22 mmol/L (ref 22–32)
Calcium: 8.1 mg/dL — ABNORMAL LOW (ref 8.9–10.3)
Chloride: 109 mmol/L (ref 98–111)
Creatinine, Ser: 0.62 mg/dL (ref 0.44–1.00)
GFR, Estimated: >60 mL/min (ref >60)
Glucose, Bld: 100 mg/dL — ABNORMAL HIGH (ref 70–99)
Potassium: 3.9 mmol/L (ref 3.5–5.1)
Sodium: 137 mmol/L (ref 135–145)
Total Bilirubin: 1.1 mg/dL (ref 0.0–1.2)
Total Protein: 5.4 g/dL — ABNORMAL LOW (ref 6.5–8.1)

## 2023-11-24 LAB — PROCALCITONIN: Procalcitonin: 0.17 ng/mL

## 2023-11-24 MED ORDER — CEFADROXIL 500 MG PO CAPS
500.0000 mg | ORAL_CAPSULE | Freq: Two times a day (BID) | ORAL | 0 refills | Status: AC
  Filled 2023-11-24: qty 10, 5d supply, fill #0

## 2023-11-24 NOTE — Discharge Summary (Signed)
 Physician Discharge Summary  Megan Fuller FMW:[id number] DOB: [birth year] DOA: 11/22/2023  PCP: Pcp, No  Admit date: 11/22/2023 Discharge date: 11/24/2023  Admitted From: Home Disposition: Home  Recommendations for Outpatient Follow-up:  Follow up with PCP in 1-2 weeks Continue cefadroxil  to complete antibiotic course for urinary tract infection Please follow up on the following pending results: Finalized results of urine culture that were pending at time of discharge  Home Health: No Equipment/Devices: None  Discharge Condition: Stable CODE STATUS: Full code Diet recommendation: Regular diet  History of present illness:  Megan Fuller is a 38 y.o. female with past medical history significant for cirrhosis, ITP who presented to [facility] ED on 11/22/2023 with complaints of fever, lower back pain, nausea/vomiting, headache and dizziness over the last 5 days.  Back pain worst on the left.  Additionally endorses poor oral intake in days preceding hospitalization.  Denies sick contacts, no recent travel.   In the ED, temperature 102.1 F, HR 110, RR 19, BP 135/80, SpO2 98% on room air.  WBC 5.9, hemoglobin 10.3, platelet count 62.  Sodium 129, potassium 3.9, chloride 100, CO2 21, glucose 155, BUN 5, creatinine 0.79.  AST 78, ALT 58, total bilirubin 2.4.  hCG negative.  Urinalysis with large leukocytes, negative nitrite, few bacteria, 11-20 WBCs.  CT angiogram chest negative for pulmonary embolism, no acute cardiopulmonary disease process.  CT abdomen/pelvis with contrast with changes of cirrhosis with portal venous hypertension, spontaneous splenorenal shunt, splenomegaly, no acute findings in abdomen/pelvis.  CT venogram with no evidence for dural venous sinus thrombosis, no other acute intracranial normality.  Respiratory viral panel negative.  Blood cultures x 2, urine culture obtained.  Patient was given LR bolus, Tylenol , started on empiric antibiotics with ceftriaxone  and metronidazole .   TRH consulted for admission for further evaluation management of sepsis secondary to urinary tract infection.  Hospital course:  Sepsis, POA GNR urinary tract infection Patient presenting with fever, nausea/vomiting, lower back pain more on the left with associated headache and dizziness over the last 5 days.  Patient was found to have a fever 102.1 F, was tachycardic.  Urinalysis consistent with urinary tract infection.  Physical exam findings with left-sided CVA tenderness concerning for pyelonephritis; although CT abdomen/pelvis with no focal renal abnormality/stranding.  Urine culture growing gram-negative rods, finalized culture pending at time of discharge.  Patient was started on IV ceftriaxone  while inpatient will transition to cefadroxil  500 mg p.o. twice daily to complete antibiotic course.  Outpatient follow-up with PCP.  Follow-up finalized urine culture results.   Hyponatremia Patient presenting with a sodium level of 129, likely hypovolemic hyponatremia in the setting of poor oral intake in the days preceding hospitalization.  Supported with IV fluid hydration with improvement of sodium to 137 at time of discharge..   Thrombocytopenia History of ITP Cirrhosis Patient with known history of cirrhosis and ITP of unclear etiology.  Patient reports developed cirrhosis after having several medications while she was pregnant with her child due to her child being premature.  CT abdomen/pelvis with findings of nodular and shrunken liver, portal venous hypertension, splenomegaly compatible with known cirrhosis.  AST 78, ALT 58, total bilirubin 2.4 on admission.  Improved to AST 40, ALT 36 and total bilirubin 1.1 at time of discharge.  Recommend avoidance of hepatotoxins.  Outpatient follow-up with PCP.  Obesity, class I Body mass index is 32.27 kg/m.  Discharge Diagnoses:  Principal Problem:   Sepsis (HCC) Active Problems:   UTI (urinary tract infection)  Chronic ITP (idiopathic  thrombocytopenia) (HCC)   Cirrhosis (HCC)   Thrombocytopenia   Hyponatremia   Anemia    Discharge Instructions  Discharge Instructions     Call MD for:  difficulty breathing, headache or visual disturbances   Complete by: As directed    Call MD for:  extreme fatigue   Complete by: As directed    Call MD for:  persistant dizziness or light-headedness   Complete by: As directed    Call MD for:  persistant nausea and vomiting   Complete by: As directed    Call MD for:  severe uncontrolled pain   Complete by: As directed    Call MD for:  temperature >100.4   Complete by: As directed    Diet - low sodium heart healthy   Complete by: As directed    Increase activity slowly   Complete by: As directed       Allergies as of 11/24/2023       Reactions   Lactose Intolerance (gi)    bloating        Medication List     STOP taking these medications    famotidine  20 MG tablet Commonly known as: PEPCID    prenatal multivitamin Tabs tablet       TAKE these medications    acetaminophen  325 MG tablet Commonly known as: Tylenol  Take 2 tablets (650 mg total) by mouth every 6 (six) hours as needed. What changed: reasons to take this   cefadroxil  500 MG capsule Commonly known as: DURICEF Take 1 capsule (500 mg total) by mouth 2 (two) times daily for 5 days.        Allergies  Allergen Reactions   Lactose Intolerance (Gi)     bloating    Consultations: None   Procedures/Studies: CT VENOGRAM HEAD Result Date: 11/22/2023 CLINICAL DATA:  Initial evaluation for acute headache. EXAM: CT VENOGRAM HEAD TECHNIQUE: Venographic phase images of the brain were obtained following the administration of intravenous contrast. Multiplanar reformats and maximum intensity projections were generated. RADIATION DOSE REDUCTION: This exam was performed according to the departmental dose-optimization program which includes automated exposure control, adjustment of the mA and/or kV  according to patient size and/or use of iterative reconstruction technique. CONTRAST:  100mL OMNIPAQUE IOHEXOL 350 MG/ML SOLN COMPARISON:  Prior CT from 09/12/2019. FINDINGS: Brain: Cerebral volume within normal limits for patient age. No acute intracranial hemorrhage. No acute large vessel territory infarct. No mass lesion, midline shift, or mass effect. Ventricles are normal in size without hydrocephalus. No extra-axial fluid collection. Vascular: No abnormal hyperdense vessel seen prior to contrast administration. Following contrast administration, normal enhancement seen throughout the superior sagittal sinus to the torcula. Transverse and sigmoid sinuses are patent as are the jugular bulbs and visualized proximal internal jugular veins. Left transverse sinus dominant. Straight sinus, vein of Galen, and internal cerebral veins are patent. No evidence for dural venous sinus thrombosis. No dural venous sinus stenosis. No appreciable cortical vein abnormality. No abnormality about the cavernous sinus. Superior orbital veins symmetric and within normal limits. Skull: Scalp soft tissues demonstrate no acute abnormality. Calvarium intact. Sinuses/Orbits: Globes and orbital soft tissues within normal limits. Visualized paranasal sinuses are largely clear. No significant mastoid effusion. IMPRESSION: 1. Normal CT venogram. No evidence for dural venous sinus thrombosis. 2. No other acute intracranial abnormality. Electronically Signed   By: Morene Hoard M.D.   On: 11/22/2023 23:33   CT ABDOMEN PELVIS W CONTRAST Result Date: 11/22/2023 CLINICAL DATA:  Sepsis, cirrhosis.  EXAM: CT ABDOMEN AND PELVIS WITH CONTRAST TECHNIQUE: Multidetector CT imaging of the abdomen and pelvis was performed using the standard protocol following bolus administration of intravenous contrast. RADIATION DOSE REDUCTION: This exam was performed according to the departmental dose-optimization program which includes automated exposure  control, adjustment of the mA and/or kV according to patient size and/or use of iterative reconstruction technique. CONTRAST:  OMNIPAQUE IOHEXOL 350 MG/ML SOLN COMPARISON:  01/19/2022 FINDINGS: Lower chest: No acute findings Hepatobiliary: Liver is nodular and shrunken compatible with cirrhosis. Benign calcification in the right hepatic lobe, stable. Gallbladder unremarkable. No biliary ductal dilatation. Pancreas: No focal abnormality or ductal dilatation. Spleen: Splenomegaly with craniocaudal length 19 cm. Adrenals/Urinary Tract: No adrenal abnormality. No focal renal abnormality. No stones or hydronephrosis. Urinary bladder is unremarkable. Stomach/Bowel: Normal appendix. Stomach, large and small bowel grossly unremarkable. Vascular/Lymphatic: No evidence of aneurysm or adenopathy. Spontaneous left splenorenal shunt due to portal venous hypertension. Reproductive: Uterus and adnexa unremarkable.  No mass. Other: No free fluid or free air. Musculoskeletal: No acute bony abnormality. IMPRESSION: Changes of cirrhosis with portal venous hypertension, spontaneous splenorenal shunt, and splenomegaly. No acute findings in the abdomen or pelvis. Electronically Signed   By: Franky Crease M.D.   On: 11/22/2023 22:20   CT Angio Chest PE W and/or Wo Contrast Result Date: 11/22/2023 CLINICAL DATA:  Pulmonary embolism (PE) suspected, high prob. Suspected sepsis. EXAM: CT ANGIOGRAPHY CHEST WITH CONTRAST TECHNIQUE: Multidetector CT imaging of the chest was performed using the standard protocol during bolus administration of intravenous contrast. Multiplanar CT image reconstructions and MIPs were obtained to evaluate the vascular anatomy. RADIATION DOSE REDUCTION: This exam was performed according to the departmental dose-optimization program which includes automated exposure control, adjustment of the mA and/or kV according to patient size and/or use of iterative reconstruction technique. CONTRAST:  100mL OMNIPAQUE  IOHEXOL 350 MG/ML SOLN COMPARISON:  None Available. FINDINGS: Cardiovascular: Heart is normal size. Aorta is normal caliber. No filling defects in the pulmonary arteries to suggest pulmonary emboli. Mediastinum/Nodes: No mediastinal, hilar, or axillary adenopathy. Trachea and esophagus are unremarkable. Thyroid unremarkable. Lungs/Pleura: Dependent atelectasis in the lower lobes. No confluent opacities or effusions. Upper Abdomen: See abdominal CT report Musculoskeletal: Chest wall soft tissues are unremarkable. No acute bony abnormality. Review of the MIP images confirms the above findings. IMPRESSION: No evidence of pulmonary embolus. No acute cardiopulmonary disease. Electronically Signed   By: Franky Crease M.D.   On: 11/22/2023 22:17   DG Chest 2 View if patient is not in a treatment room. Result Date: 11/22/2023 CLINICAL DATA:  Suspected sepsis.  Left back pain with inspiration. EXAM: CHEST - 2 VIEW COMPARISON:  07/29/2022 FINDINGS: Heart and mediastinal contours are within normal limits. No focal opacities or effusions. No acute bony abnormality. No pneumothorax. IMPRESSION: No active cardiopulmonary disease. Electronically Signed   By: Franky Crease M.D.   On: 11/22/2023 19:15     Subjective: Patient seen examined bedside, lying in bed.  No complaints this morning.  Ready for discharge home.  Discussed will continue on oral antibiotics after discharge.  Recommend follow-up with PCP.  No other questions or concerns at this time.  Denies headache, no dizziness, no chest pain, no palpitations, no shortness of breath, no abdominal pain, no fever/chills/night sweats, no nausea cefonicid diarrhea, no focal weakness, no fatigue, no paresthesias.  No acute events overnight per nursing staff.  Discharge Exam: Vitals:   11/23/23 1958 11/24/23 0413  BP: 111/63 114/74  Pulse: 69 69  Resp:  18 17  Temp: 97.6 F (36.4 C) 97.9 F (36.6 C)  SpO2: 99% 100%   Vitals:   11/23/23 1428 11/23/23 1814 11/23/23  1958 11/24/23 0413  BP: 112/72 116/69 111/63 114/74  Pulse: 78 72 69 69  Resp: 20 20 18 17   Temp: 98 F (36.7 C) 97.8 F (36.6 C) 97.6 F (36.4 C) 97.9 F (36.6 C)  TempSrc: Oral Oral Oral Oral  SpO2: 99% 99% 99% 100%  Weight:      Height:        Physical Exam: GEN: NAD, alert and oriented x 3, obese HEENT: NCAT, PERRL, EOMI, sclera clear, MMM PULM: CTAB w/o wheezes/crackles, normal respiratory effort, on room air CV: RRR w/o M/G/R GI: abd soft, NTND, NABS, no R/G/M MSK: no peripheral edema, muscle strength globally intact 5/5 bilateral upper/lower extremities NEURO: CN II-XII intact, no focal deficits, sensation to light touch intact PSYCH: normal mood/affect Integumentary: dry/intact, no rashes or wounds    The results of significant diagnostics from this hospitalization (including imaging, microbiology, ancillary and laboratory) are listed below for reference.     Microbiology: Recent Results (from the past 240 hours)  Urine Culture     Status: Abnormal (Preliminary result)   Collection Time: 11/22/23 12:55 AM   Specimen: Urine, Clean Catch  Result Value Ref Range Status   Specimen Description   Final    URINE, CLEAN CATCH Performed at [facility], [address], [place], [place] [address]    Special Requests   Final    NONE Performed at [facility], [address], [place], [place] [address]    Culture (A)  Final    >=100,000 COLONIES/mL GRAM NEGATIVE RODS SUSCEPTIBILITIES TO FOLLOW Performed at [facility], [address], [place], [place] [address]    Report Status PENDING  Incomplete  Resp panel by RT-PCR (RSV, Flu A&B, Covid) Anterior Nasal Swab     Status: None   Collection Time: 11/22/23 11:15 PM   Specimen: Anterior Nasal Swab  Result Value Ref Range Status   SARS Coronavirus 2 by RT PCR NEGATIVE NEGATIVE Final    Comment: (NOTE) SARS-CoV-2 target nucleic acids are NOT DETECTED.  The SARS-CoV-2 RNA  is generally detectable in upper respiratory specimens during the acute phase of infection. The lowest concentration of SARS-CoV-2 viral copies this assay can detect is 138 copies/mL. A negative result does not preclude SARS-Cov-2 infection and should not be used as the sole basis for treatment or other patient management decisions. A negative result may occur with  improper specimen collection/handling, submission of specimen other than nasopharyngeal swab, presence of viral mutation(s) within the areas targeted by this assay, and inadequate number of viral copies(<138 copies/mL). A negative result must be combined with clinical observations, patient history, and epidemiological information. The expected result is Negative.  Fact Sheet for Patients:  BloggerCourse.com  Fact Sheet for Healthcare Providers:  SeriousBroker.it  This test is no t yet approved or cleared by the [place]  FDA and  has been authorized for detection and/or diagnosis of SARS-CoV-2 by FDA under an Emergency Use Authorization (EUA). This EUA will remain  in effect (meaning this test can be used) for the duration of the COVID-19 declaration under Section 564(b)(1) of the Act, 21 U.S.C.section 360bbb-3(b)(1), unless the authorization is terminated  or revoked sooner.       Influenza A by PCR NEGATIVE NEGATIVE Final   Influenza B by PCR NEGATIVE NEGATIVE Final  Comment: (NOTE) The Xpert Xpress SARS-CoV-2/FLU/RSV plus assay is intended as an aid in the diagnosis of influenza from Nasopharyngeal swab specimens and should not be used as a sole basis for treatment. Nasal washings and aspirates are unacceptable for Xpert Xpress SARS-CoV-2/FLU/RSV testing.  Fact Sheet for Patients: BloggerCourse.com  Fact Sheet for Healthcare Providers: SeriousBroker.it  This test is not yet approved or cleared by the  [place]  FDA and has been authorized for detection and/or diagnosis of SARS-CoV-2 by FDA under an Emergency Use Authorization (EUA). This EUA will remain in effect (meaning this test can be used) for the duration of the COVID-19 declaration under Section 564(b)(1) of the Act, 21 U.S.C. section 360bbb-3(b)(1), unless the authorization is terminated or revoked.     Resp Syncytial Virus by PCR NEGATIVE NEGATIVE Final    Comment: (NOTE) Fact Sheet for Patients: BloggerCourse.com  Fact Sheet for Healthcare Providers: SeriousBroker.it  This test is not yet approved or cleared by the [place]  FDA and has been authorized for detection and/or diagnosis of SARS-CoV-2 by FDA under an Emergency Use Authorization (EUA). This EUA will remain in effect (meaning this test can be used) for the duration of the COVID-19 declaration under Section 564(b)(1) of the Act, 21 U.S.C. section 360bbb-3(b)(1), unless the authorization is terminated or revoked.  Performed at [facility], [address] [place], [place] [address]   Respiratory (~20 pathogens) panel by PCR     Status: None   Collection Time: 11/23/23  2:49 AM   Specimen: Nasopharyngeal Swab; Respiratory  Result Value Ref Range Status   Adenovirus NOT DETECTED NOT DETECTED Final   Coronavirus 229E NOT DETECTED NOT DETECTED Final    Comment: (NOTE) The Coronavirus on the Respiratory Panel, DOES NOT test for the novel  Coronavirus (2019 nCoV)    Coronavirus HKU1 NOT DETECTED NOT DETECTED Final   Coronavirus NL63 NOT DETECTED NOT DETECTED Final   Coronavirus OC43 NOT DETECTED NOT DETECTED Final   Metapneumovirus NOT DETECTED NOT DETECTED Final   Rhinovirus / Enterovirus NOT DETECTED NOT DETECTED Final   Influenza A NOT DETECTED NOT DETECTED Final   Influenza B NOT DETECTED NOT DETECTED Final   Parainfluenza Virus 1 NOT DETECTED NOT DETECTED Final    Parainfluenza Virus 2 NOT DETECTED NOT DETECTED Final   Parainfluenza Virus 3 NOT DETECTED NOT DETECTED Final   Parainfluenza Virus 4 NOT DETECTED NOT DETECTED Final   Respiratory Syncytial Virus NOT DETECTED NOT DETECTED Final   Bordetella pertussis NOT DETECTED NOT DETECTED Final   Bordetella Parapertussis NOT DETECTED NOT DETECTED Final   Chlamydophila pneumoniae NOT DETECTED NOT DETECTED Final   Mycoplasma pneumoniae NOT DETECTED NOT DETECTED Final    Comment: Performed at [facility], [address], [place], [place] [address]  MRSA Next Gen by PCR, Nasal     Status: None   Collection Time: 11/23/23  2:59 PM   Specimen: Nasal Mucosa; Nasal Swab  Result Value Ref Range Status   MRSA by PCR Next Gen NOT DETECTED NOT DETECTED Final    Comment: (NOTE) The GeneXpert MRSA Assay (FDA approved for NASAL specimens only), is one component of a comprehensive MRSA colonization surveillance program. It is not intended to diagnose MRSA infection nor to guide or monitor treatment for MRSA infections. Test performance is not FDA approved in patients less than 45 years old. Performed at [facility], [address], [place], [place] [address]      Labs: BNP (last 3  results) No results for input(s): BNP in the last 8760 hours. Basic Metabolic Panel: Recent Labs  Lab 11/22/23 1931 11/23/23 0240 11/24/23 0736  NA 129* 136 137  K 3.9 4.0 3.9  CL 100 106 109  CO2 21* 22 22  GLUCOSE 155* 110* 100*  BUN 5* 5* 6  CREATININE 0.79 0.73 0.62  CALCIUM  8.5* 8.3* 8.1*  MG  --  2.2  --   PHOS  --  3.7  --    Liver Function Tests: Recent Labs  Lab 11/22/23 1931 11/23/23 0240 11/24/23 0736  AST 78* 59* 40  ALT 58* 48* 36  ALKPHOS 143* 127* 106  BILITOT 2.4* 1.7* 1.1  PROT 6.3* 5.8* 5.4*  ALBUMIN  3.2* 2.9* 2.7*   No results for input(s): LIPASE, AMYLASE in the last 168 hours. No results for input(s): AMMONIA in the last 168 hours. CBC: Recent Labs   Lab 11/22/23 1930 11/23/23 0240 11/24/23 0736  WBC 5.9 5.9 2.8*  NEUTROABS 4.6  --   --   HGB 10.3* 11.0* 10.8*  HCT 30.4* 32.6* 32.7*  MCV 90.2 91.3 92.4  PLT 62* 58* 55*   Cardiac Enzymes: Recent Labs  Lab 11/23/23 1518  CKTOTAL 49   BNP: Invalid input(s): POCBNP CBG: No results for input(s): GLUCAP in the last 168 hours. D-Dimer No results for input(s): DDIMER in the last 72 hours. Hgb A1c No results for input(s): HGBA1C in the last 72 hours. Lipid Profile No results for input(s): CHOL, HDL, LDLCALC, TRIG, CHOLHDL, LDLDIRECT in the last 72 hours. Thyroid function studies Recent Labs    11/23/23 0240  TSH 2.280   Anemia work up Recent Labs    11/23/23 0240 11/23/23 1518  VITAMINB12  --  1,236*  FOLATE  --  10.5  FERRITIN  --  44  TIBC  --  253  IRON  --  42  RETICCTPCT 2.0  --    Urinalysis    Component Value Date/Time   COLORURINE YELLOW 11/22/2023 1930   APPEARANCEUR CLEAR 11/22/2023 1930   LABSPEC 1.004 (L) 11/22/2023 1930   PHURINE 7.0 11/22/2023 1930   GLUCOSEU NEGATIVE 11/22/2023 1930   HGBUR NEGATIVE 11/22/2023 1930   BILIRUBINUR NEGATIVE 11/22/2023 1930   BILIRUBINUR negative 10/24/2014 2021   KETONESUR NEGATIVE 11/22/2023 1930   PROTEINUR NEGATIVE 11/22/2023 1930   UROBILINOGEN 1.0 10/24/2014 2021   UROBILINOGEN 1.0 07/06/2014 1434   NITRITE NEGATIVE 11/22/2023 1930   LEUKOCYTESUR LARGE (A) 11/22/2023 1930   Sepsis Labs Recent Labs  Lab 11/22/23 1930 11/23/23 0240 11/24/23 0736  WBC 5.9 5.9 2.8*   Microbiology Recent Results (from the past 240 hours)  Urine Culture     Status: Abnormal (Preliminary result)   Collection Time: 11/22/23 12:55 AM   Specimen: Urine, Clean Catch  Result Value Ref Range Status   Specimen Description   Final    URINE, CLEAN CATCH Performed at [facility], [address], [place], [place] [address]    Special Requests   Final    NONE Performed at [facility], [address] [place], [place] [address]    Culture (A)  Final    >=100,000 COLONIES/mL GRAM NEGATIVE RODS SUSCEPTIBILITIES TO FOLLOW Performed at [facility], [address] [place], [place] [address]    Report Status PENDING  Incomplete  Resp panel by RT-PCR (RSV, Flu A&B, Covid) Anterior Nasal Swab     Status: None   Collection Time: 11/22/23 11:15 PM  Specimen: Anterior Nasal Swab  Result Value Ref Range Status   SARS Coronavirus 2 by RT PCR NEGATIVE NEGATIVE Final    Comment: (NOTE) SARS-CoV-2 target nucleic acids are NOT DETECTED.  The SARS-CoV-2 RNA is generally detectable in upper respiratory specimens during the acute phase of infection. The lowest concentration of SARS-CoV-2 viral copies this assay can detect is 138 copies/mL. A negative result does not preclude SARS-Cov-2 infection and should not be used as the sole basis for treatment or other patient management decisions. A negative result may occur with  improper specimen collection/handling, submission of specimen other than nasopharyngeal swab, presence of viral mutation(s) within the areas targeted by this assay, and inadequate number of viral copies(<138 copies/mL). A negative result must be combined with clinical observations, patient history, and epidemiological information. The expected result is Negative.  Fact Sheet for Patients:  BloggerCourse.com  Fact Sheet for Healthcare Providers:  SeriousBroker.it  This test is no t yet approved or cleared by the [place]  FDA and  has been authorized for detection and/or diagnosis of SARS-CoV-2 by FDA under an Emergency Use Authorization (EUA). This EUA will remain  in effect (meaning this test can be used) for the duration of the COVID-19 declaration under Section 564(b)(1) of the Act, 21 U.S.C.section 360bbb-3(b)(1), unless the authorization is terminated  or revoked sooner.        Influenza A by PCR NEGATIVE NEGATIVE Final   Influenza B by PCR NEGATIVE NEGATIVE Final    Comment: (NOTE) The Xpert Xpress SARS-CoV-2/FLU/RSV plus assay is intended as an aid in the diagnosis of influenza from Nasopharyngeal swab specimens and should not be used as a sole basis for treatment. Nasal washings and aspirates are unacceptable for Xpert Xpress SARS-CoV-2/FLU/RSV testing.  Fact Sheet for Patients: BloggerCourse.com  Fact Sheet for Healthcare Providers: SeriousBroker.it  This test is not yet approved or cleared by the [place]  FDA and has been authorized for detection and/or diagnosis of SARS-CoV-2 by FDA under an Emergency Use Authorization (EUA). This EUA will remain in effect (meaning this test can be used) for the duration of the COVID-19 declaration under Section 564(b)(1) of the Act, 21 U.S.C. section 360bbb-3(b)(1), unless the authorization is terminated or revoked.     Resp Syncytial Virus by PCR NEGATIVE NEGATIVE Final    Comment: (NOTE) Fact Sheet for Patients: BloggerCourse.com  Fact Sheet for Healthcare Providers: SeriousBroker.it  This test is not yet approved or cleared by the [place]  FDA and has been authorized for detection and/or diagnosis of SARS-CoV-2 by FDA under an Emergency Use Authorization (EUA). This EUA will remain in effect (meaning this test can be used) for the duration of the COVID-19 declaration under Section 564(b)(1) of the Act, 21 U.S.C. section 360bbb-3(b)(1), unless the authorization is terminated or revoked.  Performed at [facility], [address] [place], [place] [address]   Respiratory (~20 pathogens) panel by PCR     Status: None   Collection Time: 11/23/23  2:49 AM   Specimen: Nasopharyngeal Swab; Respiratory  Result Value Ref Range Status   Adenovirus NOT DETECTED NOT DETECTED  Final   Coronavirus 229E NOT DETECTED NOT DETECTED Final    Comment: (NOTE) The Coronavirus on the Respiratory Panel, DOES NOT test for the novel  Coronavirus (2019 nCoV)    Coronavirus HKU1 NOT DETECTED NOT DETECTED Final   Coronavirus NL63 NOT DETECTED NOT DETECTED Final   Coronavirus OC43 NOT DETECTED NOT DETECTED Final   Metapneumovirus NOT DETECTED NOT DETECTED Final  Rhinovirus / Enterovirus NOT DETECTED NOT DETECTED Final   Influenza A NOT DETECTED NOT DETECTED Final   Influenza B NOT DETECTED NOT DETECTED Final   Parainfluenza Virus 1 NOT DETECTED NOT DETECTED Final   Parainfluenza Virus 2 NOT DETECTED NOT DETECTED Final   Parainfluenza Virus 3 NOT DETECTED NOT DETECTED Final   Parainfluenza Virus 4 NOT DETECTED NOT DETECTED Final   Respiratory Syncytial Virus NOT DETECTED NOT DETECTED Final   Bordetella pertussis NOT DETECTED NOT DETECTED Final   Bordetella Parapertussis NOT DETECTED NOT DETECTED Final   Chlamydophila pneumoniae NOT DETECTED NOT DETECTED Final   Mycoplasma pneumoniae NOT DETECTED NOT DETECTED Final    Comment: Performed at [facility], [facility], [address] [place], [place] [address]  MRSA Next Gen by PCR, Nasal     Status: None   Collection Time: 11/23/23  2:59 PM   Specimen: Nasal Mucosa; Nasal Swab  Result Value Ref Range Status   MRSA by PCR Next Gen NOT DETECTED NOT DETECTED Final    Comment: (NOTE) The GeneXpert MRSA Assay (FDA approved for NASAL specimens only), is one component of a comprehensive MRSA colonization surveillance program. It is not intended to diagnose MRSA infection nor to guide or monitor treatment for MRSA infections. Test performance is not FDA approved in patients less than 63 years old. Performed at [facility], [address], [place], [place] [address]      Time coordinating discharge: Over 30 minutes  SIGNED:   Camellia PARAS [place], DO  [facility] 11/24/2023, 9:31 AM

## 2023-11-25 LAB — URINE CULTURE: Culture: >=100000 — AB

## 2023-11-27 LAB — CULTURE, BLOOD (ROUTINE X 2)
Culture: NO GROWTH
Culture: NO GROWTH
Special Requests: ADEQUATE
